# Patient Record
Sex: Male | Born: 1945 | Race: White | Hispanic: No | Marital: Married | Smoking: Former smoker
Health system: Southern US, Community
[De-identification: ages and names within clinical notes are randomized; demographics above are authoritative.]

## PROBLEM LIST (undated history)

## (undated) DIAGNOSIS — F419 Anxiety disorder, unspecified: Secondary | ICD-10-CM

## (undated) DIAGNOSIS — I493 Ventricular premature depolarization: Secondary | ICD-10-CM

## (undated) DIAGNOSIS — G453 Amaurosis fugax: Secondary | ICD-10-CM

## (undated) DIAGNOSIS — R059 Cough, unspecified: Secondary | ICD-10-CM

## (undated) DIAGNOSIS — I10 Essential (primary) hypertension: Secondary | ICD-10-CM

## (undated) DIAGNOSIS — R05 Cough: Secondary | ICD-10-CM

## (undated) HISTORY — DX: Ventricular premature depolarization: I49.3

## (undated) HISTORY — PX: HERNIA REPAIR: SHX51

## (undated) HISTORY — DX: Essential (primary) hypertension: I10

## (undated) HISTORY — DX: Cough: R05

## (undated) HISTORY — DX: Amaurosis fugax: G45.3

## (undated) HISTORY — DX: Anxiety disorder, unspecified: F41.9

## (undated) HISTORY — DX: Cough, unspecified: R05.9

## (undated) HISTORY — PX: ELBOW SURGERY: SHX618

## (undated) HISTORY — PX: CERVICAL DISCECTOMY: SHX98

---

## 2000-11-05 ENCOUNTER — Encounter: Payer: Self-pay | Admitting: Neurosurgery

## 2000-11-05 ENCOUNTER — Ambulatory Visit (HOSPITAL_COMMUNITY): Admission: RE | Admit: 2000-11-05 | Discharge: 2000-11-05 | Payer: Self-pay | Admitting: Neurosurgery

## 2001-04-19 ENCOUNTER — Encounter: Payer: Self-pay | Admitting: Neurosurgery

## 2001-04-19 ENCOUNTER — Ambulatory Visit (HOSPITAL_COMMUNITY): Admission: RE | Admit: 2001-04-19 | Discharge: 2001-04-19 | Payer: Self-pay | Admitting: Neurosurgery

## 2001-04-30 ENCOUNTER — Ambulatory Visit (HOSPITAL_COMMUNITY): Admission: RE | Admit: 2001-04-30 | Discharge: 2001-04-30 | Payer: Self-pay | Admitting: Neurosurgery

## 2001-04-30 ENCOUNTER — Encounter: Payer: Self-pay | Admitting: Neurosurgery

## 2004-04-17 ENCOUNTER — Encounter: Payer: Self-pay | Admitting: Internal Medicine

## 2006-07-09 ENCOUNTER — Encounter: Admission: RE | Admit: 2006-07-09 | Discharge: 2006-07-09 | Payer: Self-pay | Admitting: Sports Medicine

## 2007-02-17 ENCOUNTER — Encounter: Admission: RE | Admit: 2007-02-17 | Discharge: 2007-02-17 | Payer: Self-pay | Admitting: Surgery

## 2007-02-19 ENCOUNTER — Ambulatory Visit (HOSPITAL_BASED_OUTPATIENT_CLINIC_OR_DEPARTMENT_OTHER): Admission: RE | Admit: 2007-02-19 | Discharge: 2007-02-19 | Payer: Self-pay | Admitting: Surgery

## 2009-06-29 ENCOUNTER — Ambulatory Visit
Admission: RE | Admit: 2009-06-29 | Discharge: 2009-06-29 | Payer: Self-pay | Admitting: Physical Medicine and Rehabilitation

## 2009-06-29 ENCOUNTER — Encounter (INDEPENDENT_AMBULATORY_CARE_PROVIDER_SITE_OTHER): Payer: Self-pay | Admitting: Physical Medicine and Rehabilitation

## 2009-06-29 ENCOUNTER — Ambulatory Visit: Payer: Self-pay | Admitting: Vascular Surgery

## 2009-06-29 ENCOUNTER — Emergency Department (HOSPITAL_COMMUNITY): Admission: EM | Admit: 2009-06-29 | Discharge: 2009-06-29 | Payer: Self-pay | Admitting: Emergency Medicine

## 2009-06-29 ENCOUNTER — Ambulatory Visit: Payer: Self-pay | Admitting: Cardiovascular Disease

## 2010-03-21 ENCOUNTER — Encounter: Admission: RE | Admit: 2010-03-21 | Discharge: 2010-03-21 | Payer: Self-pay | Admitting: Neurosurgery

## 2010-03-22 ENCOUNTER — Encounter: Admission: RE | Admit: 2010-03-22 | Discharge: 2010-03-22 | Payer: Self-pay | Admitting: Neurosurgery

## 2010-06-14 ENCOUNTER — Telehealth (INDEPENDENT_AMBULATORY_CARE_PROVIDER_SITE_OTHER): Payer: Self-pay | Admitting: *Deleted

## 2010-06-15 ENCOUNTER — Ambulatory Visit: Payer: Self-pay | Admitting: Internal Medicine

## 2010-06-15 DIAGNOSIS — E785 Hyperlipidemia, unspecified: Secondary | ICD-10-CM | POA: Insufficient documentation

## 2010-06-15 DIAGNOSIS — I4949 Other premature depolarization: Secondary | ICD-10-CM

## 2010-06-15 DIAGNOSIS — R002 Palpitations: Secondary | ICD-10-CM | POA: Insufficient documentation

## 2010-08-11 ENCOUNTER — Encounter: Admission: RE | Admit: 2010-08-11 | Discharge: 2010-08-11 | Payer: Self-pay | Admitting: Surgery

## 2010-09-12 ENCOUNTER — Telehealth (INDEPENDENT_AMBULATORY_CARE_PROVIDER_SITE_OTHER): Payer: Self-pay | Admitting: *Deleted

## 2010-10-19 ENCOUNTER — Encounter: Payer: Self-pay | Admitting: Internal Medicine

## 2010-10-19 ENCOUNTER — Encounter: Admission: RE | Admit: 2010-10-19 | Discharge: 2010-10-19 | Payer: Self-pay | Admitting: Family Medicine

## 2010-11-02 ENCOUNTER — Ambulatory Visit: Payer: Self-pay | Admitting: Internal Medicine

## 2010-11-02 DIAGNOSIS — R059 Cough, unspecified: Secondary | ICD-10-CM | POA: Insufficient documentation

## 2010-11-02 DIAGNOSIS — R05 Cough: Secondary | ICD-10-CM

## 2010-11-13 ENCOUNTER — Telehealth: Payer: Self-pay | Admitting: Internal Medicine

## 2010-11-22 ENCOUNTER — Telehealth: Payer: Self-pay | Admitting: Internal Medicine

## 2010-12-24 ENCOUNTER — Encounter: Payer: Self-pay | Admitting: Sports Medicine

## 2011-01-04 NOTE — Progress Notes (Signed)
  Recieved Records today from [facility] asked for them to be sent here. gave to [place]. Sean Mendoza  June 14, 2010 8:21 AM

## 2011-01-04 NOTE — Progress Notes (Signed)
Summary: refill request  Phone Note Refill Request Message from:  Patient on November 13, 2010 9:39 AM  metoprolol  delayed from [facility]-can get 7 pills at [facility] [phone]   Method Requested: Telephone to Pharmacy Initial call taken by: Glynda Jaeger,  November 13, 2010 9:41 AM  Follow-up for Phone Call        RX sent in to pharmacy. LMOM for pt. Marrion Coy, CNA  November 13, 2010 4:12 PM  Follow-up by: Marrion Coy, CNA,  November 13, 2010 4:12 PM    Prescriptions: METOPROLOL SUCCINATE 25 MG XR24H-TAB (METOPROLOL SUCCINATE) 1 three times a day  #90 x 2   Entered by:   Marrion Coy, CNA   Authorized by:   Nathen May, MD, [facility]   Signed by:   Marrion Coy, CNA on 11/13/2010   Method used:   Electronically to        [facility]. [phone]* (retail)       [phone] [address]       [place], [place]  [address]       Ph: [phone]       Fax: [phone]   RxID:   [id number]

## 2011-01-04 NOTE — Assessment & Plan Note (Signed)
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 They variably symptomatic aggravated by stress caffeine and lack of exercise. Previous evaluation has demonstrated normal left ventricular function again confirmed by echo in 2010.  The last couple of months they have been more symptomatic. This follows discectomy surgery on his neck which left him unable to exercise.  He denies chest pain shortness of breath or peripheral edema. There has been no syncope.  He did have an episode of transient left hand weakness. He saw Dr. Pearlean Brownie who thought that maybe he had a stroke. He then saw Dr. love in [place] who thought that this may have been acephalic migraine and that the MRI abnormality was trivial  Current Medications (verified): 1)  Aspirin 81 Mg Tabs (Aspirin) .... Take One Tablet Once Daily 2)  Coenzyme Q10 10 Mg Caps (Coenzyme Q10) .... Take One Tablet Capsule Once Daily 3)  Magnesium Oxide 400 Mg Tabs (Magnesium Oxide) .... Take One Tablet Once Daily 4)  Vitamin B-2 100 Mg Tabs (Riboflavin) .... Take Four Tablets Once Daily 5)  Fish Oil 1200 Mg Caps (Omega-3 Fatty Acids) .... Take One Capsule Once Daily 6)  Metoprolol Succinate 50 Mg Xr24h-Tab (Metoprolol Succinate) .... Take One Tablet Three Times A Day 7)  Fluoxetine Hcl 20 Mg Caps (Fluoxetine Hcl) .... Take One Capsule Once Daily 8)  Simvastatin 20 Mg Tabs (Simvastatin) .... Take One Tablet Once Daily  Allergies (verified): 1)  ! Sulfa  Past History:  Family History: Last updated: 06/15/2010 Negative FH of Diabetes, Hypertension, or Coronary Artery Disease  Social History: Last updated: 06/15/2010 Tobacco Use - No.  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  Alcohol Use - no Drug Use - no married 4 children  Review of Systems       full review of systems was negative apart from a history of present illness and past medical history except allergies  Vital Signs:  Patient profile:   65 year old male Height:      72 inches Weight:      211 pounds BMI:     28.72 Pulse rate:   63 / minute Pulse rhythm:   regular BP sitting:   138 / 84  (left arm) Cuff size:   regular  Vitals Entered By: Sean Mendoza CMA (June 15, 2010 12:42 PM)  Physical Exam  General:  Alert and oriented in no acute distress. HEENT  normal . Neck veins were flat; carotids brisk and full without bruits. No lymphadenopathy. Back without kyphosis. Lungs clear. Heart sounds regular without murmurs or gallops. PMI nondisplaced. Abdomen soft with active bowel sounds without midline pulsation or hepatomegaly. Femoral pulses and distal pulses intact. Extremities were without clubbing cyanosis or edemaSkin warm and dry. Neurological exam grossly normal affecgt non engaging    EKG  Procedure date:  06/15/2010  Findings:      sinus rhythm at 63 Intervals 0.15/0.13/0.43 Axis is -18 Otherwise notmal   Impression & Recommendations:  Problem # 1:  PREMATURE VENTRICULAR CONTRACTIONS (ICD-427.69) The patient has PVCs that suppress with exercise and are adrenergically sensitive.  The origin of these is not evident in the multiple records that we reveiewed..  Still they are not a significant problem most of the time esp if he is exercising frequently .  Sean Mendoza t having been said, with echo showing normlal LV function, I would do nothing differently His updated medication list for this problem includes:    Aspirin 81 Mg Tabs (Aspirin) .Marland Kitchen... Take one tablet once daily    Metoprolol  Succinate 50 Mg Xr24h-tab (Metoprolol succinate) .Marland Kitchen... Take one tablet three times a day  Problem # 2:  DYSLIPIDEMIA (ICD-272.4) His lipids are  being managed by Dr Kevan Ny.  I have suggested though based on recent issues with simvastatin, that alternatvie therapy would be reasonable.  He will resume his red yeast rice for now His updated medication list for this problem includes:    Simvastatin 20 Mg Tabs (Simvastatin) .Marland Kitchen... Take one tablet once daily  Patient Instructions: 1)  Your physician wants you to follow-up in: 6 months with Dr Graciela Husbands.  You will receive a reminder letter in the mail two months in advance. If you don't receive a letter, please call our office to schedule the follow-up appointment.

## 2011-01-04 NOTE — Progress Notes (Signed)
  pt signed ROI, i faxed records over to [facility] [place] to Dr.Donna Elspeth Cho Mesiemore  September 12, 2010 11:37 AM

## 2011-01-04 NOTE — Letter (Signed)
Summary: Echocardiogram, Office Note, Labs, EKG 2002 - 2006  Echocardiogram, Office Note, Labs, EKG 2002 - 2006   Imported By: Roderic Ovens 06/23/2010 11:23:25  _____________________________________________________________________  External Attachment:    Type:   Image     Comment:   External Document

## 2011-01-04 NOTE — Progress Notes (Signed)
Summary: talk to nurse  Phone Note Call from Patient Call back at Home Phone [phone]   Caller: Patient Call For: Emmaleah Meroney Summary of Call: Pt states he was given a rx for nexium 40mg  but brought prisolec instead, and states it's not working pls advise.//[facility] Initial call taken by: Darletta Moll,  November 22, 2010 1:07 PM  Follow-up for Phone Call        spoke to the pt and he states he never tried nexium he got prilosec instead and at first it helped his cough some but it did not last. Pt wanted to [place] should he try the nexium. I advised the pt to do as MW said at last ov..do nexium 40 mg 30-60 min before first meal and then do the pepcid at night until symptoms are gone for 1 week then stop the pepcid..not the nexium. Pt states understanding and requests a 90 day supply of nexium be sent to [facility]. Rx sent. pt aware. Carron Curie CMA  November 22, 2010 3:40 PM     Prescriptions: NEXIUM 40 MG  CPDR (ESOMEPRAZOLE MAGNESIUM) Take  one 30-60 min before first meal of the day  #90 x 0   Entered by:   Carron Curie CMA   Authorized by:   Nyoka Cowden MD   Signed by:   Carron Curie CMA on 11/22/2010   Method used:   Telephoned to ...       [facility] (mail-order)             , [place]         Ph: [phone]       Fax: [phone]   RxID:   [id number]

## 2011-01-04 NOTE — Assessment & Plan Note (Signed)
Summary: Pulmonary consultation/ cough ? all uacs   Visit Type:  Initial Consult Copy to:  Dr. Shaune Pollack Primary Provider/Referring Provider:  Dr. Shaune Pollack  CC:  Chest congestion/Cough.  History of Present Illness: 29 yowm quit smoking in the 1970's with some wheezing used inhalers only transiently then  and since then sev times a year maybe related to flying recurrent  cough and wheeze with no need for inhalers or even otcs and good ex tolerance  with no  chronic cough.   November 02, 2010  1st pulmonary office eval cc soreness in chest that accompanied the usual cough  6 weeks prior to ov when the weather got cold this time worse than usual with minimal clear mucus. rx with with zpak then prednisone now complete but not back to baseline with sense of throat and chest congestion and nasal congestion.    Pt presently denies any significant sore throat, dysphagia, itching, sneezing,    excess or purulent nasal secretions,  fever, chills, sweats, unintended wt loss, pleuritic or exertional cp, hempoptysis, change in activity tolerance  orthopnea pnd or leg swelling Pt also denies any obvious fluctuation in symptoms with weather or environmental change or other alleviating or aggravating factors.       Current Medications (verified): 1)  Aspirin 81 Mg Tabs (Aspirin) .... Take One Tablet Once Daily 2)  Coenzyme Q10 10 Mg Caps (Coenzyme Q10) .... Take One Tablet Capsule Once Daily 3)  Magnesium Oxide 400 Mg Tabs (Magnesium Oxide) .... Take One Tablet Once Daily 4)  Vitamin B-2 100 Mg Tabs (Riboflavin) .... Take Four Tablets Once Daily 5)  Fish Oil 1200 Mg Caps (Omega-3 Fatty Acids) .... Take One Capsule Once Daily 6)  Metoprolol Succinate 25 Mg Xr24h-Tab (Metoprolol Succinate) .Marland Kitchen.. 1 Three Times A Day 7)  Fluoxetine Hcl 20 Mg Caps (Fluoxetine Hcl) .... Take One Capsule Once Daily 8)  Red Yeast Rice 600 Mg Caps (Red Yeast Rice Extract) .Marland Kitchen.. 1 Once Daily 9)  Mucinex Dm Maximum Strength  60-1200 Mg Xr12h-Tab (Dextromethorphan-Guaifenesin) .Marland Kitchen.. 1 To 2 Two Times A Day As Needed  Allergies (verified): 1)  ! 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  Quit in 1979.  Smoked for approx 5 yrs. Smoked up to 2 ppd ETOH- wine every other day Pleas Koch "constantly"  Review of Systems       The patient complains of productive cough, irregular heartbeats, sore throat, nasal congestion/difficulty breathing through nose, sneezing, and chest pain.  The patient denies shortness of breath with activity, shortness of breath at rest, non-productive cough, coughing up blood, acid heartburn, indigestion, loss of appetite, weight change, abdominal pain, difficulty swallowing, tooth/dental problems, headaches, itching, ear ache, anxiety, depression, hand/feet swelling, joint stiffness or pain, rash, change in color of mucus, and fever.    Vital Signs:  Patient profile:   65 year old male Weight:      213 pounds O2 Sat:      98 % on Room air Temp:     97.6 degrees F oral Pulse rate:   57 / minute BP sitting:   138 / 88  (left arm)  Vitals Entered By: Vernie Murders (November 02, 2010 10:08 AM)  O2 Flow:  Room air  Physical Exam  Additional Exam:  211 > 213 November 02, 2010  pleasant wm with classic voice fatigue and pseudowheeze resolves with purse lip maneuver  HEENT: nl dentition, turbinates, and orophanx. Nl external ear canals without cough reflex NECK :  without JVD/Nodes/TM/ nl carotid upstrokes bilaterally LUNGS: no acc muscle use, clear to A and P bilaterally without cough on insp or exp maneuvers CV:  RRR  no s3 or murmur or increase in P2, no edema    ABD:  soft and nontender with nl excursion in the supine position. No bruits or organomegaly, bowel sounds nl MS:  warm without deformities, calf tenderness, cyanosis or clubbing SKIN: warm and dry without lesions   NEURO:  alert, approp, no deficits     CXR  Procedure date:  10/19/2010  Findings:      No active lung dz  Impression & Recommendations:  Problem # 1:  COUGH (ICD-786.2)  The most common causes of chronic cough in immunocompetent adults include: upper airway cough syndrome (UACS), previously referred to as postnasal drip syndrome,  caused by variety of rhinosinus conditions; (2) asthma; (3) GERD; (4) chronic bronchitis from cigarette smoking or other inhaled environmental irritants; (5) nonasthmatic eosinophilic bronchitis; and (6) bronchiectasis. These conditions, singly or in combination, have accounted for up to 94% of the causes of chronic cough in prospective studies.  This is probably   Upper airway cough syndrome, so named because it's frequently impossible to sort out how much is  CR/sinusitis with freq throat clearing (which can be related to primary GERD)   vs  causing  secondary extra esophageal GERD from wide swings in gastric pressure that occur with throat clearing, promoting self use of mint and menthol lozenges that reduce the lower esophageal sphincter tone and exacerbate the problem further These are the same pts who not infrequently have failed to tolerate ace inhibitors,  dry powder inhalers or biphosphonates or report having reflux symptoms that don't respond to standard doses of PPI  Rec try max gerd rx first and if resolves then no pulmonary eval needed unless recurs or persists on gerd rx.  NB the  ramp to expected improvement (and for that matter, worsening, if a chronic effective medication is stopped)  can be measured in weeks, not days, a common misconception because this is not Heartburn with no immediate cause and effect relationship so that  response to therapy or lack thereof can be very difficult to assess.   Medications Added to Medication List This Visit: 1)  Fish Oil 1200 Mg Caps (Omega-3 fatty acids) .... Take one capsule once daily - hold whenever cough 2)  Metoprolol Succinate 25 Mg Xr24h-tab (Metoprolol succinate) .Marland Kitchen.. 1 three times a day 3)  Red Yeast Rice 600 Mg Caps (Red yeast rice extract) .Marland Kitchen.. 1 once daily 4)  Mucinex Dm Maximum Strength 60-1200 Mg Xr12h-tab (Dextromethorphan-guaifenesin) .Marland Kitchen.. 1 to 2 two times a day as needed 5)  Nexium 40 Mg Cpdr (Esomeprazole magnesium) .... Take  one 30-60 min before first meal of the day  Other Orders: Consultation Level V (28413) Consultation Level V (24401)  Patient Instructions: 1)  Nexium 40 mg Take  one 30-60 min before first meal of the day and pepcid 20mg  one at bedtime until cough congestion wheezing are gone for about a week then stop 2)  GERD (REFLUX)  is a common cause of respiratory symptoms. It commonly presents without heartburn and can be treated with medication,  but also with lifestyle changes including avoidance of late meals, excessive alcohol, smoking cessation, and avoid fatty foods, chocolate, peppermint, colas, red wine, and acidic juices such as orange juice. NO MINT OR MENTHOL PRODUCTS SO NO COUGH DROPS  3)  USE SUGARLESS CANDY INSTEAD (jolley ranchers)  4)  NO OIL BASED VITAMINS  5)  Please schedule a follow-up appointment as needed Prescriptions: NEXIUM 40 MG  CPDR (ESOMEPRAZOLE MAGNESIUM) Take  one 30-60 min before first meal of the day  #34 x 2   Entered and Authorized by:   Nyoka Cowden MD   Signed by:   Nyoka Cowden MD on 11/02/2010   Method used:   Electronically to        [facility]. [phone]* (retail)       [phone] [address]       [place], [place]  [address]       Ph: [phone]       Fax: [phone]   RxID:   [id number]

## 2011-01-31 ENCOUNTER — Telehealth (INDEPENDENT_AMBULATORY_CARE_PROVIDER_SITE_OTHER): Payer: Self-pay | Admitting: *Deleted

## 2011-02-08 NOTE — Progress Notes (Signed)
Summary: Records Request  Faxed OV & EKG to [facility] at [facility] ([phone]). Debby Freiberg  January 31, 2011 4:08 PM

## 2011-02-09 ENCOUNTER — Other Ambulatory Visit: Payer: Self-pay | Admitting: Surgery

## 2011-02-09 ENCOUNTER — Encounter (INDEPENDENT_AMBULATORY_CARE_PROVIDER_SITE_OTHER): Payer: Self-pay | Admitting: *Deleted

## 2011-02-09 ENCOUNTER — Encounter (HOSPITAL_COMMUNITY): Payer: BC Managed Care – PPO

## 2011-02-09 ENCOUNTER — Other Ambulatory Visit (HOSPITAL_COMMUNITY): Payer: BC Managed Care – PPO

## 2011-02-09 LAB — CBC
HCT: 47.3 % (ref 39.0–52.0)
Hemoglobin: 15.5 g/dL (ref 13.0–17.0)
MCH: 31.8 pg (ref 26.0–34.0)
MCHC: 32.8 g/dL (ref 30.0–36.0)
MCV: 97.1 fL (ref 78.0–100.0)
Platelets: 195 10*3/uL (ref 150–400)
RBC: 4.87 MIL/uL (ref 4.22–5.81)
RDW: 12.5 % (ref 11.5–15.5)
WBC: 7.4 10*3/uL (ref 4.0–10.5)

## 2011-02-09 LAB — COMPREHENSIVE METABOLIC PANEL
ALT: 31 U/L (ref 0–53)
AST: 23 U/L (ref 0–37)
Albumin: 3.9 g/dL (ref 3.5–5.2)
Alkaline Phosphatase: 58 U/L (ref 39–117)
BUN: 14 mg/dL (ref 6–23)
CO2: 30 mEq/L (ref 19–32)
Calcium: 9.5 mg/dL (ref 8.4–10.5)
Chloride: 104 mEq/L (ref 96–112)
Creatinine, Ser: 1.11 mg/dL (ref 0.4–1.5)
GFR calc Af Amer: >60 mL/min (ref >60)
GFR calc non Af Amer: >60 mL/min (ref >60)
Glucose, Bld: 83 mg/dL (ref 70–99)
Potassium: 4.4 mEq/L (ref 3.5–5.1)
Sodium: 142 mEq/L (ref 135–145)
Total Bilirubin: 1.7 mg/dL — ABNORMAL HIGH (ref 0.3–1.2)
Total Protein: 7.1 g/dL (ref 6.0–8.3)

## 2011-02-09 LAB — DIFFERENTIAL
Basophils Absolute: 0 10*3/uL (ref 0.0–0.1)
Basophils Relative: 0 % (ref 0–1)
Eosinophils Absolute: 0.2 10*3/uL (ref 0.0–0.7)
Eosinophils Relative: 3 % (ref 0–5)
Lymphocytes Relative: 25 % (ref 12–46)
Lymphs Abs: 1.9 10*3/uL (ref 0.7–4.0)
Monocytes Absolute: 1.2 10*3/uL — ABNORMAL HIGH (ref 0.1–1.0)
Monocytes Relative: 16 % — ABNORMAL HIGH (ref 3–12)
Neutro Abs: 4.2 10*3/uL (ref 1.7–7.7)
Neutrophils Relative %: 56 % (ref 43–77)

## 2011-02-09 LAB — SURGICAL PCR SCREEN
MRSA, PCR: NEGATIVE
Staphylococcus aureus: NEGATIVE

## 2011-02-13 ENCOUNTER — Ambulatory Visit (HOSPITAL_COMMUNITY)
Admission: RE | Admit: 2011-02-13 | Discharge: 2011-02-13 | Disposition: A | Discharge status: Home / Self Care | Payer: BC Managed Care – PPO | Source: Ambulatory Visit | Attending: Surgery | Admitting: Surgery

## 2011-02-13 DIAGNOSIS — Z01818 Encounter for other preprocedural examination: Secondary | ICD-10-CM | POA: Insufficient documentation

## 2011-02-13 DIAGNOSIS — I10 Essential (primary) hypertension: Secondary | ICD-10-CM | POA: Insufficient documentation

## 2011-02-13 DIAGNOSIS — K4090 Unilateral inguinal hernia, without obstruction or gangrene, not specified as recurrent: Secondary | ICD-10-CM | POA: Insufficient documentation

## 2011-02-13 DIAGNOSIS — R109 Unspecified abdominal pain: Secondary | ICD-10-CM | POA: Insufficient documentation

## 2011-02-13 DIAGNOSIS — Z01812 Encounter for preprocedural laboratory examination: Secondary | ICD-10-CM | POA: Insufficient documentation

## 2011-02-13 NOTE — Letter (Signed)
Summary: Appointment - Reschedule  [facility], Main Office  [address]   [place], [place] [address]   Phone: [phone]  Fax: [phone]     February 09, 2011 MRN: [id number]   [facility] [address] [place], [place]  [address]   Dear Sean Mendoza,   Due to a change in our office schedule, your appointment on  03-02-2011                     at  9:10 a.m. must be changed.  It is very important that we reach you to reschedule this appointment. We look forward to participating in your health care needs. Please contact us at the number listed above at your earliest convenience to reschedule this appointment.     Sincerely,     Lorne Skeens  [facility] Scheduling Team

## 2011-02-16 NOTE — Op Note (Signed)
NAMESEVERUS, BRODZINSKI              ACCOUNT NO.:  [id number]  MEDICAL RECORD NO.:  [id number]           PATIENT TYPE:  O  LOCATION:  DAYL                         FACILITY:  [facility]  PHYSICIAN:  Jazlene Bares A. Joss Mcdill, M.D.DATE OF BIRTH:  1946/06/20  DATE OF PROCEDURE: DATE OF DISCHARGE:                              OPERATIVE REPORT   PREOPERATIVE DIAGNOSIS:  History of left inguinal hernia repair mesh with intermittent left inguinal pain, questionable bilateral inguinal hernia by MRI evaluation.  POSTOPERATIVE DIAGNOSES: 1. Small right indirect inguinal hernia. 2. No evidence of left inguinal hernia with intact mesh with good     coverage of the internal ring and floor of the inguinal canal.  PROCEDURE:  Transabdominal repair of right inguinal hernia with mesh with examination of left inguinal canal.  ANESTHESIA:  General endotracheal anesthesia, 0.25% Sensorcaine local.  ESTIMATED BLOOD LOSS:  Under 50 mL.  SPECIMENS:  None. INDICATIONS FOR PROCEDURE:  The patient is a 65 year old male, back in 2008 he had a repair of his left inguinal hernia done.  He has had done fairly well but had some intermittent pain off and on over the last 6 months to 1 year.  The pain does slow his activity somewhat and he came for evaluation.  MRI showed bilateral indirect inguinal hernias which were quite small by MRI evaluation.  Examination did not reveal any recurrence of inguinal hernia.  Had a long discussion with he and wife about options of continued conservative care which would be medications and no surgery and just dealing with the pain as needed since it was a continuous versus laparoscopic evaluation and potential repair given the fact the MRI showed potentially bilateral inguinal hernia.  After lengthy discussion of the procedure, potential complications of bleeding, infection, injury to the bladder, injury to the colon, injury to the small bowel, small-bowel obstruction, injury to the  pelvic vessels, injury to the bladder, urethra or ureter, injury to the spermatic cord, testicular swelling, testicular pain, testicular loss and ischemia.  After discussion of all these potential issues with laparoscopic evaluation and potential repair, he wished to proceed.  DESCRIPTION OF PROCEDURE:  The patient was brought to the operating room.  After induction of general anesthesia, abdomen was prepped and draped in sterile fashion, and time-out was done.  Foley catheter was placed to decompress the bladder without difficulty.  1-cm supraumbilical incision was made.  Dissection was carried down to his fascia.  The fascia was opened midline.  The abdominal cavity was entered.  Pursestring suture of 0 Vicryl was placed and a 12-mm port was placed.  He was insufflated to 15 mmHg with CO2 and placed in Trendelenburg position.  Laparoscope was placed.  Upon examination, saw a very small right inguinal hernia which was indirect.  Upon examination of the left inguinal canal, I could see the inner leaflet of his Prolene Hernia System mesh which was in adequate position.  There was some mild contracture albeit but it actually, I thought, looked very good with no evidence of any recurrent indirect defect around it.  The direct region of the left looked good as well without any  obvious large inguinal hernia on the left side.  I did not see a direct inguinal nerve defect on the right.  I would like to go ahead and repair the right side since we discussed this preoperatively.  Also felt that needed to open the preperitoneal space here to examine the mesh carefully and to make sure there was a small indirect defect or small defect that occurred at a direct triangle.  Cautery was used to open the preperitoneal space just below the umbilicus.  The space was entered easily.  The bladder was well decompressed in the operative field.  We opened the peritoneal opening all way toward the right  anterior-superior iliac spine region. Once we opened this, I bluntly dissected down and dissected the small indirect hernia sac out from the inguinal canal.  There was a small tear though in the peritoneum there.  Once I dissected all way down below the iliopubic tract, I then worked medially and dissected out the Cooper's ligament with care taken not to injure the iliac vein.  No evidence of femoral hernia.  I then found the pubic tubercle of pubis.  The bladder was well decompressed and I created a small amount of space just behind the pubis carefully, bluntly very gently to place mesh back behind.  I did not see a direct defect on this side.  I then took the cautery and went ahead and opened the patient's left preperitoneal space.  There was a piece of mesh in it.  I was very careful to take the peritoneal down to the mesh.  The mesh was well incorporated in the preperitoneal space. I went ahead and dissected medially to get into the direct triangle region here and again I saw no evidence of hernia defect or failure of the previous repair.  There is a little bit of bulging here but the mesh was intact.  It was sutured down to the tubercle as well as to the iliopubic tract.  I did not see any defects or anything had been sliding around the mesh nor I see any indirect defect.  The repair to me looked adequate and looked well positioned.  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  Actually repair looked good.  I did not feel any intervention on this side would be prudent since I think more damage would be caused by trying to do any more exploring and/or explantation of this mesh since this pain was not that severe and I was afraid of compromising that repair by doing so.  At this point in time, I felt that the repair of the right-sided hernia was the only thing left to do.  I went ahead and got a 6 x 6 inch piece of  Ultrapro mesh.  Actually, let this overlap on to the patient's left side to give additional coverage of the direct triangle on the left side.  This lay quite nicely down below the pubis and I was able to tack it using a secure strap to Cooper's ligament medially with adequate effect.  I then put a few additional secure strap tacks on the mesh superiorly.  Care taken to avoid the injury at the epigastric vessels.  I did not put the tacks laterally for fear of injuring the nerves.  I was able to tuck the mesh down well below to cover the defect and laid flat.  I then went ahead  at this point in time and closed the peritoneum to secure the strap to where there were no gaps.  The small tear in the peritoneum and closing with 2-0 Ethibond suture using intracorporeal suturing techniques to secure and close the small peritoneal defect that overlay the mesh to prevent anything from slipping down in it.  This closed nicely and I did not see any defect of concern at this point in time.  Irrigation was suctioned out.  Urine was clear.  Re-examined the abdominal cavity and saw no evidence of injury to the small bowel or colon from this point of procedure and his bladder again looked without injury and I saw no extravasation of urine at this point in time.  At this point in time, we flattened and went out completely.  The omentum slid very nicely back behind the repair.  I slowly insufflated allowing the CO2 to escape.  There was some subcutaneous swelling noted from the air.  At this point in time, I removed all ports, allowing the CO2 to escape.  Total of 3 ports were used and all the 5-mm ports placed across lower midline.  At this point in time, we closed the fascial opening with a pursestring suture of 0 Vicryl, 4-0 Monocryl was used to close the skin.  Dermabond was applied. Foley catheter was removed.  There are no signs of any bleeding from the meatus or any blood in the urine.  All final  counts were found to be correct.  The patient was extubated and taken to recovery in satisfactory condition.     Katelinn Justice A. Gloriana Piltz, M.D.     TAC/MEDQ  D:  02/13/2011  T:  02/13/2011  Job:  [id number]  Electronically Signed by Harriette Bouillon M.D. on 02/16/2011 09:20:22 AM

## 2011-02-19 ENCOUNTER — Encounter: Payer: Self-pay | Admitting: Internal Medicine

## 2011-03-02 ENCOUNTER — Ambulatory Visit: Payer: Self-pay | Admitting: Internal Medicine

## 2011-03-19 ENCOUNTER — Ambulatory Visit (INDEPENDENT_AMBULATORY_CARE_PROVIDER_SITE_OTHER): Payer: BC Managed Care – PPO | Admitting: Internal Medicine

## 2011-03-19 ENCOUNTER — Encounter: Payer: Self-pay | Admitting: Internal Medicine

## 2011-03-19 VITALS — BP 154/60 | HR 58 | Ht 72.0 in | Wt 210.4 lb

## 2011-03-19 DIAGNOSIS — G4730 Sleep apnea, unspecified: Secondary | ICD-10-CM

## 2011-03-19 DIAGNOSIS — I10 Essential (primary) hypertension: Secondary | ICD-10-CM

## 2011-03-19 DIAGNOSIS — I4949 Other premature depolarization: Secondary | ICD-10-CM

## 2011-03-19 DIAGNOSIS — I4510 Unspecified right bundle-branch block: Secondary | ICD-10-CM

## 2011-03-19 DIAGNOSIS — I493 Ventricular premature depolarization: Secondary | ICD-10-CM

## 2011-03-19 MED ORDER — METOPROLOL TARTRATE 25 MG PO TABS: ORAL_TABLET | ORAL | Status: DC

## 2011-03-19 MED ORDER — VITAMIN B-2 100 MG PO TABS: ORAL_TABLET | ORAL | Status: DC

## 2011-03-19 NOTE — Assessment & Plan Note (Signed)
Relatively stable at this point he will down titrate his beta blocker

## 2011-03-19 NOTE — Patient Instructions (Signed)
Your physician recommends that you schedule a follow-up appointment in: 6 MONTHS WITH DR Graciela Husbands Your physician recommends that you continue on your current medications as directed. Please refer to the Current Medication list given to you today. Night watch

## 2011-03-19 NOTE — Assessment & Plan Note (Signed)
Stable And unchanged from 2001

## 2011-03-19 NOTE — Assessment & Plan Note (Signed)
He has sleep disordered breathing daytime some wounds and a.m. Headaches. His triads suggests sleep apnea. He also has long-standing hypertension. We'll obtain a night watch for study

## 2011-03-19 NOTE — Assessment & Plan Note (Signed)
We will check his night watch for sleep apnea. Otherwise he may need therapy for his hypertension. He says he has whitecoat hypertension. I am not convinced.

## 2011-03-19 NOTE — Progress Notes (Signed)
  HPI  Sean Mendoza is a 65 y.o. 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  The origin of these is not evident in the multiple records that we reveiewed..  Still they are not a significant problem most of the time esp if he is exercising frequently .  Hurley Cisco t having been said, with echo showing normlal LV function,   He has been treated with metoprolol. Dr. Shelva Majestic told them that throbbing in his knee might be related to excess and metoprolol dosing and he is down titrated back to 50 mg. He has noted no untoward.  He also reports that his blood pressure has been high along time. He does snore his wife says very loudly. He has daytime somnolence and a.m. Headaches.    Past Medical History  Diagnosis Date  . HTN (hypertension)   . Migraine   . PVC's (premature ventricular contractions)   . Anxiety   . Cough     wert on set 09/2010    Past Surgical History  Procedure Date  . Hernia repair   . Cervical discectomy   . Elbow surgery      Current Outpatient Prescriptions  Medication Sig Dispense Refill  . aspirin 81 MG tablet Take 81 mg by mouth daily.        Marland Kitchen co-enzyme Q-10 30 MG capsule Take 30 mg by mouth daily.        Marland Kitchen FLUoxetine (PROZAC) 20 MG capsule Take 20 mg by mouth daily.        . Magnesium Oxide 400 (241.3 MG) MG TABS 1 tab po qd       . oxyCODONE-acetaminophen (PERCOCET) 7.5-325 MG per tablet       . esomeprazole (NEXIUM) 40 MG capsule Take 40 mg by mouth daily before breakfast.        . guaiFENesin (MUCINEX) 600 MG 12 hr tablet Take 1,200 mg by mouth 2 (two) times daily.        . Omega-3 Fatty Acids (FISH OIL) 1200 MG CAPS 1 tab po qd       . Red Yeast Rice Extract 600 MG CAPS 1 tab po qd       . Tamsulosin HCl (FLOMAX) 0.4 MG CAPS       . traMADol (ULTRAM) 50 MG tablet       . vitamin B-12 (CYANOCOBALAMIN) 100 MCG tablet 4 tabs po qd         Allergies  Allergen Reactions  . Ampicillin     REACTION: itching  .  Sulfonamide Derivatives     Review of Systems negative except from HPI and PMH  Physical Exam Well developed and well nourished in no acute distress HENT normal E scleral and icterus clear Neck Supple JVP flat; carotids brisk and full Clear to ausculation Regular rate and rhythm, no murmurs gallops or rub Soft with active bowel sounds No clubbing cyanosis and edema Alert and oriented, grossly normal motor and sensory function Skin Warm and Dry  ECG Normal sinus rhythm at 58 Intervals 0.16/0.13/145 Axis is leftward -29  Assessment and  Plan

## 2011-03-21 ENCOUNTER — Other Ambulatory Visit: Payer: Self-pay | Admitting: *Deleted

## 2011-03-21 DIAGNOSIS — G4730 Sleep apnea, unspecified: Secondary | ICD-10-CM

## 2011-03-27 ENCOUNTER — Telehealth: Payer: Self-pay | Admitting: Internal Medicine

## 2011-03-30 NOTE — Telephone Encounter (Signed)
OKAY WILL LET DR Graciela Husbands KNOW./CY

## 2011-04-20 NOTE — Op Note (Signed)
[place]  Patient:    Sean Mendoza, Sean Mendoza                     MRN: [id number] Proc. Date: 11/05/00 Adm. Date:  [id number] Attending:  Donn Pierini                           Operative Report  SERVICE:  Neurosurgery.  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  PROCEDURE:  Left C6-7 laminotomy and foraminotomy with microdissection.  SURGEON:  Julio Sicks, M.D.  ASSISTANT:  Reinaldo Meeker, M.D.  ANESTHESIA:  General endotracheal anesthesia.  INDICATIONS:  Sean Mendoza is a 65 year old with a history of neck and left upper extremity pain and paresthesias consistent with a left-sided C7 radiculopathy.  He has some mild left-sided triceps weakness as well.  MRI scanning demonstrates a foraminal disk herniation off to the left side at C6-7 with compression of the left-sided C7 nerve root.  The patient has been counseled as to his options including both operative and nonoperative management.  He has decided to proceed with a left-sided C6-7 laminotomy foraminotomy for hopeful improvement of his symptoms.  OPERATIVE NOTE:  The patient was taken to the operating room and placed on the table in supine position.  After an adequate level of anesthesia was achieved, the patient was positioned prone onto bolsters with the head fixed in neutral high position using a [place] 10 headdress.  The patients posterior cervical region was shaved and prepped sterilely.  A 10 blade was used to make a linear skin incision overlying the C6-7 interspace.  This was carried down sharply in the midline.  A subperiosteal dissection was then performed on the left side exposing the lamina and facet joints of C6 and C7.  A deep self-retaining retractor was placed, and x-rays taken.  The level was confirmed.  The laminotomy was  then performed using Kerrison rongeurs to remove the superior 1/3rd of the lamina of C7, the medial aspect of the C6-7 facet joint, and the inferior rim of the lamina of C6.  The ligamentum of flavum was then elevated and resected in a piecemeal fashion using Kerrison rongeurs.  The underlying thecal sac and exiting C7 nerve root were identified.  The microscope was brought into the field for microdissection of the epidural space and underlying disk herniation.   The epidural venous plexus with coagulating cut.  The sheath overlying the left side of C7 nerve root was dissected free using microinstruments and incised.  Approach to the disk was made through the axillae of the C7 nerve root.  The nerve root itself was retracted gently superiorly.  A large, kind of hump of disk herniation was encountered.  This was incised with an 11 blade.  This was then removed using pituitary rongeurs.  This completely decompressed the nerve root.  At this point there is no evidence of any continued compression.  There is no evidence of any residual disk herniation.  The wound was then copiously irrigated with antibiotic solution.  Gelfoam was placed topically for hemostasis which was found to be good.  Retraction instrument was removed.  Hemostasis in the muscle was achieved with electrocautery and the wound was then closed in layers with Vicryl sutures. Staples were applied to the surface.  There were no apparent complications. The patient tolerated  the procedure well and he returns to the recovery room postoperatively. DD:  11/05/00 TD:  11/05/00 Job: [id number]

## 2011-04-20 NOTE — Op Note (Signed)
Sean Mendoza, Sean Mendoza              ACCOUNT NO.:  [id number]   MEDICAL RECORD NO.:  [id number]          PATIENT TYPE:  AMB   LOCATION:  NESC                         FACILITY:  [facility]   PHYSICIAN:  Thomas A. Cornett, M.D.DATE OF BIRTH:  Apr 24, 1946   DATE OF PROCEDURE:  02/19/2007  DATE OF DISCHARGE:                               OPERATIVE REPORT   PREOPERATIVE DIAGNOSIS:  Left inguinal hernia.   POSTOPERATIVE DIAGNOSIS:  Left inguinal hernia.   OPERATION/PROCEDURE:  Left inguinal hernia repair with mesh.   SURGEON:  Maisie Fus A. Cornett, M.D.   ANESTHESIA:  LMA with 20 mL of 0.25% Sensorcaine with epinephrine.   ESTIMATED BLOOD LOSS:  20 mL   SPECIMEN:  None.   INDICATIONS FOR PROCEDURE:  The patient is a 65 year old male with a  small left inguinal hernia who presents today for repair.  It is  symptomatic and interfering with his quality of life.  After informed  consent, the patient agreed to proceed with left inguinal hernia with  mesh.  He was marked preoperatively.   DESCRIPTION OF PROCEDURE:  The patient was brought to the operating  room, placed supine.  After induction of LMA anesthesia, left inguinal  region was prepped and draped in sterile fashion.  I infiltrated the  left inguinal crease with 0.25% Sensorcaine.  Incision was made.  Dissection was carried down through Scarpa's fascia.  The superficial  epigastric vessels were ligated.  Once I encountered the aponeurosis of  the external oblique, I infiltrated this with 0.25% Sensorcaine with  epinephrine.  The aponeurosis was opened in the direction of its fibers.  Cord structures were identified, encircled with a quarter-inch Penrose  drain.  Upon examination was very large lipoma which I teased away from  the cord and amputated at its base and passed it off the field.  This  was ligated with a 2-0 Vicryl.  The patient had a very large internal  ring opening and this ran right along the cord.  This was then probed  with my finger and I was able to create space within the preperitoneal  space by going down along cord and using my finger to bluntly dissect  underneath the muscles.  Once this was done a large Prolene hernia  system was placed with the large circular port in the preperitoneal  space which I used my finger to spread out to close this defect.  The  [place] piece was laid flat against the floor of the inguinal canal.  The  mesh was tacked to the pubic tubercle with an 0 Vicryl and the  connecting piece was tacked to the internal oblique medially with a 0  Vicryl.  The ilioinguinal nerve was swept away from this mesh since it  was not directly adjacent to it and pushed away and preserved.  I  secured the mesh to the shelving edge of the inguinal ligament with #2  Novofil.  Was also secured to the internal oblique and conjoined tendon  medially with a #2 Novofil.  A small slit was cut in the mesh and the  cord was allowed to  slide through this and then the mesh was closed  around the cord with #2 Novofil.  The mesh lay flat and hemostasis was  excellent.  I then closed the aponeurosis of the external oblique at  this point in time with a 2-0 Vicryl.  A 3-0 Vicryl was used to close  Scarpa's fascia and 4-0 Monocryl was used to close the skin.  Of note,  the cord structures exited  the mesh freely with the tip of my fifth digit and inserted quite easily  into this area without any tension.  Steri-Strips and dry dressings were  applied.  The patient tolerated the procedure well and was taken to  recovery in satisfactory condition.  All final counts were correct of  sponge, needle and instruments.      Thomas A. Cornett, M.D.  Electronically Signed     TAC/MEDQ  D:  02/19/2007  T:  02/19/2007  Job:  [id number]   cc:   Oley Balm. Georgina Pillion, M.D.  Fax: [phone]

## 2011-06-12 ENCOUNTER — Encounter (INDEPENDENT_AMBULATORY_CARE_PROVIDER_SITE_OTHER): Payer: BC Managed Care – PPO

## 2011-07-30 ENCOUNTER — Telehealth: Payer: Self-pay | Admitting: Internal Medicine

## 2011-07-30 NOTE — Telephone Encounter (Signed)
Pt having palpitations, getting worse, coughing

## 2011-07-30 NOTE — Telephone Encounter (Signed)
I talked with pt. Pt states for the last few weeks he has noticed an increase in palpitations. He denies lightheadedness or other symptoms but states the palpitations are annoying. Pt did see his PCP in the last week or so and was told to increase metoprolol tartrate to three 25mg  daily instead of two daily. Pt states he has not seen an improvement in his palpitations. He was going to check his metoprolol to be sure it was tartrate and not succinate. I will forward this to Dr Graciela Husbands for review and recommendations.

## 2011-08-08 NOTE — Telephone Encounter (Signed)
If these are the same, then they probably are the PVCs which have been thought to be adrenergically sensitive.  I would have him increase his betabolcer to 50 bid Metoprolol tartrate and get a TSH, CBC, and K/Mg to look for reasons that this may be getting worse thanks

## 2011-08-08 NOTE — Telephone Encounter (Signed)
Forwarding to Dr. Klein. 

## 2011-08-15 NOTE — Telephone Encounter (Signed)
I spoke with the patient. He states that he has been on metoprolol succ 25mg  TID. His PCP, Dr. Kevan Ny at [place] [facility] [place] has recently given him a prescription for metoprolol tartrate 25mg  to take prn. He has noticed no change in his palpitations. He states that he had labwork done about 2 months ago with Dr. Kevan Ny. I will call her office to see what he has had done. I have also advised that he stop metoprolol succ and start metoprolol tartrate 50mg  bid per Dr. Graciela Husbands to see if he notices a change in his symptoms. He will take metoprolol tart 25mg  two tablets bid for the next few days to see how he feels. He will call and let me know if this is working for him and we will update his RX at the pharmacy. He states he is on an OTC magnesium supplement of 200mg  daily. He wanted to know if he should be on a potassium supplement. I have advised that I will obtain his labs first, but that I would not advise anything more than potassium in his diet. He verbalizes understanding. Sherri Rad, RN, BSN  I have spoken with medical records at Dr. Kevan Ny office and they are going to send his most recent labs for me to review.

## 2011-08-15 NOTE — Telephone Encounter (Signed)
LMTC

## 2011-08-17 NOTE — Telephone Encounter (Signed)
I received a copy of the patient's labs from Dr. Kevan Ny office. Last labs were from 01/2011- lipid panel & PSA. 09/2010- BMP, 12/2009- CBC/TSH. The patient is out of town the rest of this week. I will call him back Monday to let him know when his labs were drawn.

## 2011-08-20 ENCOUNTER — Telehealth: Payer: Self-pay | Admitting: Cardiology

## 2011-08-20 MED ORDER — METOPROLOL TARTRATE 25 MG PO TABS: ORAL_TABLET | ORAL | Status: DC

## 2011-08-20 NOTE — Telephone Encounter (Signed)
Metoprolol 25 two twice a day,  Uses [facility] [address]

## 2011-08-21 ENCOUNTER — Telehealth: Payer: Self-pay | Admitting: Internal Medicine

## 2011-08-21 NOTE — Telephone Encounter (Signed)
Pt out of med, requested refill Monday and would like called in asap, and requesting call as soon as called in

## 2011-08-21 NOTE — Telephone Encounter (Signed)
PT CALLED AND STATED HE WENT TO PICK UP METOPROLOL AND IT WAS NOT THERE.  PLEASE CALL HIM WHEN THIS HAS BEEN DONE.  [phone]

## 2011-08-30 NOTE — Telephone Encounter (Signed)
LM for the patient to call

## 2011-09-03 NOTE — Telephone Encounter (Signed)
I spoke with the patient. He states he is feeling better on the metoprolol tartrate twice daily. I have explained to him that I did receive labs from Dr. Kevan Ny office, and for any reason he started to have any other problems, we would need to repeat labs because his last bmp was 09/2010 & his last CBC & TSH was 12/2009. He verbalizes understanding.

## 2011-09-03 NOTE — Telephone Encounter (Signed)
Pt returning call to Heather. Please call back.  

## 2011-09-03 NOTE — Telephone Encounter (Signed)
LMTC

## 2011-09-07 NOTE — Telephone Encounter (Signed)
The pt called the office with questions about developing palpitations over the past two days.  The pt said he had adjusted his metoprolol tartrate dosage times and wondered if this could cause symptoms.  I made the pt aware that this could and I recommended that he go back to taking medication twice a day. Pt agreed with plan.

## 2011-09-14 ENCOUNTER — Telehealth: Payer: Self-pay | Admitting: Internal Medicine

## 2011-09-14 NOTE — Telephone Encounter (Signed)
Pt calling wanting to ask nurse a question about medication pt is on. Please return call to discuss further.

## 2011-09-17 ENCOUNTER — Encounter: Payer: Self-pay | Admitting: Internal Medicine

## 2011-09-18 ENCOUNTER — Other Ambulatory Visit: Payer: Self-pay | Admitting: *Deleted

## 2011-09-18 DIAGNOSIS — I493 Ventricular premature depolarization: Secondary | ICD-10-CM

## 2011-09-18 MED ORDER — METOPROLOL TARTRATE 50 MG PO TABS: 50.0000 mg | ORAL_TABLET | Freq: Two times a day (BID) | ORAL | Status: DC

## 2011-09-18 NOTE — Telephone Encounter (Signed)
Pt has not had a call regarding his question about the pres.  Pt.is going out of town.  Please call today.

## 2011-09-19 NOTE — Telephone Encounter (Signed)
Pt is now out of town and has not heard anything.  He was told to advise if any issues and he is having issues but no call back.  [phone].

## 2011-09-26 ENCOUNTER — Ambulatory Visit (INDEPENDENT_AMBULATORY_CARE_PROVIDER_SITE_OTHER): Payer: BC Managed Care – PPO | Admitting: Internal Medicine

## 2011-09-26 ENCOUNTER — Encounter: Payer: Self-pay | Admitting: Internal Medicine

## 2011-09-26 DIAGNOSIS — R059 Cough, unspecified: Secondary | ICD-10-CM

## 2011-09-26 DIAGNOSIS — I4949 Other premature depolarization: Secondary | ICD-10-CM

## 2011-09-26 DIAGNOSIS — I10 Essential (primary) hypertension: Secondary | ICD-10-CM

## 2011-09-26 DIAGNOSIS — G4730 Sleep apnea, unspecified: Secondary | ICD-10-CM

## 2011-09-26 DIAGNOSIS — R05 Cough: Secondary | ICD-10-CM

## 2011-09-26 MED ORDER — ATENOLOL 50 MG PO TABS: 50.0000 mg | ORAL_TABLET | Freq: Every day | ORAL | Status: DC

## 2011-09-26 MED ORDER — NADOLOL 40 MG PO TABS: 40.0000 mg | ORAL_TABLET | Freq: Every day | ORAL | Status: DC

## 2011-09-26 MED ORDER — DILTIAZEM HCL ER COATED BEADS 120 MG PO CP24: 120.0000 mg | ORAL_CAPSULE | Freq: Every day | ORAL | Status: DC

## 2011-09-26 MED ORDER — VERAPAMIL HCL ER 120 MG PO CP24: 120.0000 mg | ORAL_CAPSULE | Freq: Every day | ORAL | Status: DC

## 2011-09-26 NOTE — Assessment & Plan Note (Signed)
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 We'll give him prescriptions today for now all, atenolol, retinal, and diltiazem. We'll see which one works best. Maryclare Labrador see him in 4 months.

## 2011-09-26 NOTE — Assessment & Plan Note (Signed)
We will continue him on his beta blockers. He is again encouraged to have a sleep study. Unfortunately [employer] refuse to pay for his home study. He will consider whether to have a sleep lab test

## 2011-09-26 NOTE — Assessment & Plan Note (Signed)
Encouraged to follow up with pulmonary

## 2011-09-26 NOTE — Assessment & Plan Note (Signed)
As above.

## 2011-09-26 NOTE — Patient Instructions (Signed)
Your physician has recommended you make the following change in your medication: You are being given prescriptions for 4 different medications to try in any order. Please allow at least 2 weeks on each medication before changing. Do not take more than one of these prescriptions at a time. 1) Nadolol 40mg  one tablet by mouth once daily. 2) Atenolol 50mg  one tablet by mouth once daily. 3) Diltiazem 120mg  one tablet by mouth once daily. 4) Verapamil 120mg  one tablet by mouth onced daily. - if you are taking nadolol/ atenolol and decide to switch to either diltiazem/ verapamil, then please take a 1/2 of a tablet on the nadolol/ atenolol for 5 days before stopping and changing to either diltiazem/ verapamil.  Your physician wants you to follow-up in: 4 months. You will receive a reminder letter in the mail two months in advance. If you don't receive a letter, please call our office to schedule the follow-up appointment.

## 2011-09-26 NOTE — Progress Notes (Signed)
  HPI  Sean Mendoza is a 65 y.o. 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  The origin of these is not evident in the multiple records that we reveiewed..  Still they are not a significant problem most of the time esp if he is exercising frequently .  Sean Mendoza t having been said, with echo showing normlal LV function,   He also reports that his blood pressure has been high along time. He does snore his wife says very loudly. He has daytime somnolence and a.m. Headaches.  Try taking metoprolol tartrate; this worked for a while and then he started having cough. Is concerned about allergies he went back to metoprolol succinate. His PVCs continue to be bothersome.    Past Medical History  Diagnosis Date  . HTN (hypertension)   . Migraine   . PVC's (premature ventricular contractions)   . Anxiety   . Cough     wert on set 09/2010    Past Surgical History  Procedure Date  . Hernia repair   . Cervical discectomy   . Elbow surgery      Current Outpatient Prescriptions  Medication Sig Dispense Refill  . aspirin 81 MG tablet Take 81 mg by mouth daily.        Marland Kitchen co-enzyme Q-10 30 MG capsule Take 30 mg by mouth daily.        Marland Kitchen esomeprazole (NEXIUM) 40 MG capsule Take 40 mg by mouth daily before breakfast.        . FLUoxetine (PROZAC) 20 MG capsule Take 20 mg by mouth daily.        Marland Kitchen guaiFENesin (MUCINEX) 600 MG 12 hr tablet Take 1,200 mg by mouth 2 (two) times daily.        . Magnesium Oxide 400 (241.3 MG) MG TABS 1 tab po qd       . metoprolol (LOPRESSOR) 50 MG tablet Take 1 tablet (50 mg total) by mouth 2 (two) times daily.  180 tablet  3  . oxyCODONE-acetaminophen (PERCOCET) 7.5-325 MG per tablet       . Red Yeast Rice Extract 600 MG CAPS 1 tab po qd       . riboflavin (VITAMIN B-2) 100 MG TABS 2 TABS EVERY DAY    0  . Tamsulosin HCl (FLOMAX) 0.4 MG CAPS       . traMADol (ULTRAM) 50 MG tablet       . vitamin B-12 (CYANOCOBALAMIN) 100 MCG tablet 4  tabs po qd       . Omega-3 Fatty Acids (FISH OIL) 1200 MG CAPS 1 tab po qd         Allergies  Allergen Reactions  . Ampicillin     REACTION: itching  . Sulfonamide Derivatives     Review of Systems negative except from HPI and PMH  Physical Exam Well developed and well nourished in no acute distress HENT normal E scleral and icterus clear Neck Supple JVP flat; carotids brisk and full Clear to ausculation Regular rate and rhythm, no murmurs gallops or rub Soft with active bowel sounds No clubbing cyanosis and edema Alert and oriented, grossly normal motor and sensory function Skin Warm and Dry  ECG Normal sinus rhythm at 58 Intervals 0.16/0.13/145 Axis is leftward -29  Assessment and  Plan

## 2011-10-16 ENCOUNTER — Other Ambulatory Visit: Payer: Self-pay | Admitting: Internal Medicine

## 2011-10-16 ENCOUNTER — Other Ambulatory Visit: Payer: Self-pay | Admitting: *Deleted

## 2011-10-16 DIAGNOSIS — I4949 Other premature depolarization: Secondary | ICD-10-CM

## 2011-10-16 MED ORDER — NADOLOL 40 MG PO TABS: 40.0000 mg | ORAL_TABLET | Freq: Every day | ORAL | Status: DC

## 2011-10-16 NOTE — Telephone Encounter (Signed)
Pt calling stating he needs a refill of nadolol 40 mg- 3 month supply called into [facility]. Pt would like metoprolol cancelled off his med list as the nadolol will be replacing the metoprolol.

## 2011-10-21 ENCOUNTER — Encounter (HOSPITAL_BASED_OUTPATIENT_CLINIC_OR_DEPARTMENT_OTHER): Payer: BC Managed Care – PPO

## 2011-10-23 ENCOUNTER — Encounter (HOSPITAL_BASED_OUTPATIENT_CLINIC_OR_DEPARTMENT_OTHER): Payer: BC Managed Care – PPO

## 2011-10-23 ENCOUNTER — Telehealth: Payer: Self-pay | Admitting: Internal Medicine

## 2011-10-23 DIAGNOSIS — I4949 Other premature depolarization: Secondary | ICD-10-CM

## 2011-10-23 MED ORDER — NADOLOL 40 MG PO TABS: 40.0000 mg | ORAL_TABLET | Freq: Every day | ORAL | Status: DC

## 2011-10-23 NOTE — Telephone Encounter (Signed)
New message:  Pt is going to be out of Nadolol 40 mg before they come in from [facility].  Please call in a 30 day supply to [facility] to hold him until they come in.

## 2011-10-24 ENCOUNTER — Telehealth: Payer: Self-pay | Admitting: Internal Medicine

## 2011-10-24 DIAGNOSIS — I4949 Other premature depolarization: Secondary | ICD-10-CM

## 2011-10-24 MED ORDER — NADOLOL 40 MG PO TABS: 40.0000 mg | ORAL_TABLET | Freq: Every day | ORAL | Status: DC

## 2011-10-24 NOTE — Telephone Encounter (Signed)
rx called into the wrong [address] said he gave [place] which address, but message only [address], needs Nadolol 40 mg called to [id number] [phone], pt going out of town and needs to pick up med this am

## 2011-10-24 NOTE — Telephone Encounter (Signed)
LMOM for pt that RX was sent into pharmacy.

## 2011-10-31 ENCOUNTER — Other Ambulatory Visit: Payer: Self-pay | Admitting: *Deleted

## 2011-10-31 ENCOUNTER — Other Ambulatory Visit: Payer: Self-pay | Admitting: Internal Medicine

## 2011-10-31 ENCOUNTER — Telehealth: Payer: Self-pay | Admitting: *Deleted

## 2011-10-31 DIAGNOSIS — I4949 Other premature depolarization: Secondary | ICD-10-CM

## 2011-10-31 MED ORDER — ATENOLOL 50 MG PO TABS: 50.0000 mg | ORAL_TABLET | Freq: Every day | ORAL | Status: DC

## 2011-10-31 NOTE — Telephone Encounter (Signed)
Patient called to have Atenolol 50 mg prescription call to [facility], a 30 day supply called to pharmacy as requested for pt.

## 2011-11-14 ENCOUNTER — Encounter (HOSPITAL_BASED_OUTPATIENT_CLINIC_OR_DEPARTMENT_OTHER): Payer: BC Managed Care – PPO

## 2011-11-20 ENCOUNTER — Encounter (HOSPITAL_BASED_OUTPATIENT_CLINIC_OR_DEPARTMENT_OTHER): Payer: BC Managed Care – PPO

## 2011-11-29 ENCOUNTER — Encounter (HOSPITAL_BASED_OUTPATIENT_CLINIC_OR_DEPARTMENT_OTHER): Payer: BC Managed Care – PPO

## 2011-12-03 ENCOUNTER — Telehealth: Payer: Self-pay | Admitting: Internal Medicine

## 2011-12-03 NOTE — Telephone Encounter (Signed)
I spoke with the pt and he has been trying Nadolol and Atenolol since his office visit.  The pt has been taking these medications for a couple of weeks and then the med no longer works so he switches back and forth.  At this time the pt would like to try Diltiazem 120mg  daily.  I reminded the pt that he needs to taper his beta blocker dose over the next five days. Rx for Diltiazem #30 refill 6 called into [facility].

## 2011-12-03 NOTE — Telephone Encounter (Signed)
Fu call  Pt is leaving town today and he hasnt heard anything about these. please call him when completed.

## 2011-12-03 NOTE — Telephone Encounter (Signed)
New msg Pt wants to know about rx for diltazem and verapamil. He wants them  called [facility] [phone]. These are the other two meds he was told to try please let him know when done. Today if possible

## 2011-12-03 NOTE — Telephone Encounter (Signed)
Left message for pt to call back.  Need to clarify what medication the pt is currently taking.

## 2011-12-10 ENCOUNTER — Telehealth: Payer: Self-pay | Admitting: Internal Medicine

## 2011-12-10 NOTE — Telephone Encounter (Signed)
New msg Pt was calling about new med he is taking- diltiazem. He wanted to talk to you about it. Please call

## 2011-12-10 NOTE — Telephone Encounter (Signed)
We had thought that the PVCs were sensitive to adrenaline   That seems to be confirmed  Which of the beta blockers as he tolerated the best

## 2011-12-10 NOTE — Telephone Encounter (Signed)
Patient calling stating he has had a lot more irregular heart beats since he weaned off nadolol 2 days ago.States is taking diltiazem er 120 mg daily for the past 2 days.Wants to know is this to be expected.Fowarded to Dr. Graciela Husbands for advice.

## 2011-12-11 NOTE — Telephone Encounter (Signed)
I spoke with Dr. Graciela Husbands about his response. He stated that since the patient was having more PVC's off of his beta blocker therapy, that they were more likely to be adrenaline driven or made worse by this. He recommends trying beta blocker therapy. He wanted to know if the patient favored one beta blocker over another from the trial he was given. He is failed all of them, then we can relook at what else is available. I have left a message for the patient to call back. I have advised I will be out of the office on 1/9, but he may speak with triage, or call me back on 1/10.

## 2011-12-12 ENCOUNTER — Telehealth: Payer: Self-pay | Admitting: *Deleted

## 2011-12-12 DIAGNOSIS — I493 Ventricular premature depolarization: Secondary | ICD-10-CM

## 2011-12-12 NOTE — Telephone Encounter (Signed)
12/12/11--heather and dr Nelson Chimes Kruckenberg calling stating he has been on the nadolol and atenolol for 28 days and altho they worked very well at first they didn't hold him,--he has been on diltiazem for 1 week and this does not seem to be working yet--he is also having acid reflux--he would like to know why, rather than having try the ca channel blockers, he couldn't just take a higher dose  of the beta blockers nadolol and atenolol--he seems very concerned about this and wondered if dr Graciela Husbands would call him tonoc and tell him exactly what to take next--he is having frequent PVC'S and rapid heart rate--advised i would pass his message along to dr Graciela Husbands and Herbert Seta, but could not guarantee a call back from dr Graciela Husbands tonight--i did advise him that heather and dr Graciela Husbands will be in office am of 12/13/11(mr spivack is in [place])--phone [phone]--nt

## 2011-12-13 ENCOUNTER — Ambulatory Visit (HOSPITAL_BASED_OUTPATIENT_CLINIC_OR_DEPARTMENT_OTHER): Payer: BC Managed Care – PPO | Attending: Internal Medicine | Admitting: Radiology

## 2011-12-13 VITALS — Ht 72.0 in | Wt 205.0 lb

## 2011-12-13 DIAGNOSIS — G4730 Sleep apnea, unspecified: Secondary | ICD-10-CM

## 2011-12-13 DIAGNOSIS — G4710 Hypersomnia, unspecified: Secondary | ICD-10-CM | POA: Insufficient documentation

## 2011-12-13 DIAGNOSIS — I498 Other specified cardiac arrhythmias: Secondary | ICD-10-CM | POA: Insufficient documentation

## 2011-12-13 DIAGNOSIS — R0683 Snoring: Secondary | ICD-10-CM

## 2011-12-13 NOTE — Telephone Encounter (Signed)
Per Dr. Graciela Husbands, we can d/c diltiazem and double up on the atenolol or nadolol, whichever worked the best. I spoke with the patient and made him aware of this. He states that the reflux is not bothering him so much, but his pvc's and palpitations were bothering him. He states that he spoke with Dr. Shelva Majestic last night for advice and he recommended to continue the diltiazem and take a half of the original dose of nadolol that he had been prescribed. Per the patient, he tried this and it worked well for him. He will continue this for now to see how he does, but if he feels his symptoms are not controlled, he will call back and we will double up on the atenolol or nadolol. He is agreeable with this. I will forward to Dr. Graciela Husbands as an Lorain Childes.

## 2011-12-13 NOTE — Telephone Encounter (Signed)
Addended by: Sherri Rad C on: 12/13/2011 06:01 PM   Modules accepted: Orders

## 2011-12-21 DIAGNOSIS — I498 Other specified cardiac arrhythmias: Secondary | ICD-10-CM

## 2011-12-21 DIAGNOSIS — G4710 Hypersomnia, unspecified: Secondary | ICD-10-CM

## 2011-12-21 DIAGNOSIS — G4730 Sleep apnea, unspecified: Secondary | ICD-10-CM

## 2011-12-22 NOTE — Procedures (Signed)
NAME:  Sean Mendoza, Sean Mendoza NO.:  [id number]  MEDICAL RECORD NO.:  [id number]          PATIENT TYPE:  OUT  LOCATION:  SLEEP CENTER                 FACILITY:  [facility]  PHYSICIAN:  Barbaraann Share, MD,FCCPDATE OF BIRTH:  06/21/1948  DATE OF STUDY:  12/13/2011                           NOCTURNAL POLYSOMNOGRAM  REFERRING PHYSICIAN:  Duke Salvia, MD, [facility]  LOCATION:  Sleep lab.  REFERRING PHYSICIAN:  Duke Salvia, MD, [facility]  INDICATION FOR STUDY:  Hypersomnia with sleep apnea.  EPWORTH SLEEPINESS SCORE:  4.  MEDICATIONS:  SLEEP ARCHITECTURE:  The patient had a total sleep time of 295 minutes with no slow-wave sleep and 60 minutes of REM.  Sleep onset latency was normal at 13 minutes, and REM onset was normal at 110 minutes.  Sleep efficiency was moderately reduced at 80%.  RESPIRATORY DATA:  The patient was found to have 9 apneas and 1 obstructive hypopnea, giving him an apnea-hypopnea index of 2 events per hour.  The events occurred more commonly in the supine position, and there was mild-to-moderate snoring noted throughout.  The patient did not meet split night criteria due to his small numbers of events.  OXYGEN DATA:  There was O2 desaturation transiently as low as 89%.  CARDIAC DATA:  Sinus bradycardia was noted fairly frequently during the night as well as rare PACs.  MOVEMENT-PARASOMNIA:  The patient had no significant leg jerks or other abnormal behavior seen.  IMPRESSIONS-RECOMMENDATIONS: 1. Small numbers of obstructive events, which do not meet the AHI     criteria for the obstructive sleep apnea     syndrome. 2. Sinus bradycardia noted as well as rare premature atrial     contractions.     Barbaraann Share, MD,FCCP Diplomate, American Board of Sleep Medicine Electronically Signed    [facility]MED[facility]  D:  12/21/2011 09:39:01  T:  12/22/2011 02:41:25  Job:  [id number]

## 2012-01-25 ENCOUNTER — Ambulatory Visit: Payer: BC Managed Care – PPO | Admitting: Internal Medicine

## 2012-02-15 ENCOUNTER — Telehealth: Payer: Self-pay | Admitting: Internal Medicine

## 2012-02-15 ENCOUNTER — Encounter: Payer: Self-pay | Admitting: Internal Medicine

## 2012-02-19 ENCOUNTER — Telehealth: Payer: Self-pay | Admitting: *Deleted

## 2012-02-19 NOTE — Telephone Encounter (Signed)
I left a message for the patient to call regarding his sleep study results per Dr. Graciela Husbands. Results negative for sleep apnea.

## 2012-02-19 NOTE — Telephone Encounter (Signed)
The patient is aware of his results. 

## 2012-03-03 ENCOUNTER — Other Ambulatory Visit: Payer: Self-pay | Admitting: *Deleted

## 2012-03-03 DIAGNOSIS — I4949 Other premature depolarization: Secondary | ICD-10-CM

## 2012-03-03 MED ORDER — DILTIAZEM HCL ER COATED BEADS 120 MG PO CP24: 120.0000 mg | ORAL_CAPSULE | Freq: Every day | ORAL | Status: DC

## 2012-03-06 ENCOUNTER — Telehealth: Payer: Self-pay | Admitting: Internal Medicine

## 2012-03-06 NOTE — Telephone Encounter (Signed)
JYN#[id number] refill needed diltiazem cd 120mg 

## 2012-03-27 ENCOUNTER — Other Ambulatory Visit: Payer: Self-pay | Admitting: Internal Medicine

## 2012-03-27 DIAGNOSIS — I4949 Other premature depolarization: Secondary | ICD-10-CM

## 2012-03-28 MED ORDER — DILTIAZEM HCL ER COATED BEADS 120 MG PO CP24: 120.0000 mg | ORAL_CAPSULE | Freq: Every day | ORAL | Status: DC

## 2012-11-10 ENCOUNTER — Other Ambulatory Visit: Payer: Self-pay | Admitting: Otolaryngology

## 2012-11-10 DIAGNOSIS — R439 Unspecified disturbances of smell and taste: Secondary | ICD-10-CM

## 2012-11-24 ENCOUNTER — Ambulatory Visit
Admission: RE | Admit: 2012-11-24 | Discharge: 2012-11-24 | Disposition: A | Discharge status: Home / Self Care | Payer: BC Managed Care – PPO | Source: Ambulatory Visit | Attending: Otolaryngology | Admitting: Otolaryngology

## 2012-11-24 DIAGNOSIS — R439 Unspecified disturbances of smell and taste: Secondary | ICD-10-CM

## 2012-11-24 MED ORDER — GADOBENATE DIMEGLUMINE 529 MG/ML IV SOLN
19.0000 mL | Freq: Once | INTRAVENOUS | Status: AC | PRN
  Administered 2012-11-24: 19 mL via INTRAVENOUS

## 2012-12-26 ENCOUNTER — Telehealth: Payer: Self-pay | Admitting: Internal Medicine

## 2012-12-26 DIAGNOSIS — I4949 Other premature depolarization: Secondary | ICD-10-CM

## 2012-12-26 MED ORDER — DILTIAZEM HCL ER COATED BEADS 120 MG PO CP24: 120.0000 mg | ORAL_CAPSULE | Freq: Every day | ORAL | Status: DC

## 2012-12-26 NOTE — Telephone Encounter (Signed)
New problem:   Diltiazem   120 mg   [facility] [address].

## 2012-12-29 ENCOUNTER — Telehealth: Payer: Self-pay | Admitting: Internal Medicine

## 2012-12-29 DIAGNOSIS — I4949 Other premature depolarization: Secondary | ICD-10-CM

## 2012-12-29 MED ORDER — DILTIAZEM HCL ER COATED BEADS 120 MG PO CP24: 120.0000 mg | ORAL_CAPSULE | Freq: Every day | ORAL | Status: DC

## 2012-12-29 NOTE — Telephone Encounter (Signed)
New Problem       Refill Request Diltiazem to [facility] on [address], [place]

## 2013-02-05 ENCOUNTER — Telehealth: Payer: Self-pay | Admitting: Internal Medicine

## 2013-02-05 NOTE — Telephone Encounter (Signed)
New Prob    Scheduled appt for follow up for 5/2 first available. Pt stated he knows Dr. Graciela Husbands personally and would like to see if he could possibly be squeezed in within the next 30 days after March 17. Would like to speak to nurse.

## 2013-02-05 NOTE — Telephone Encounter (Signed)
I spoke with the patient and offered him 02/25/13 at 9:00am. This works for him. I have sent a message to [facility] to schedule.

## 2013-02-18 ENCOUNTER — Telehealth: Payer: Self-pay | Admitting: Internal Medicine

## 2013-02-18 NOTE — Telephone Encounter (Signed)
Left message for pt to call, discussed with dr Graciela Husbands, all of the three meds can cause these symptoms. Dr Graciela Husbands recommends stopping each med individually for two weeks to see if his symptoms improve. So he can stop the nadolol for two weeks if no change in symptoms restart and stop the prozac for 2 weeks and repeat the same until he finds the one that causes the symptoms.

## 2013-02-18 NOTE — Telephone Encounter (Signed)
New Problem:   Patient called in because he believes he is having an adverse reaction to his nadolol (CORGARD) 40 MG tablet, FLUoxetine (PROZAC) 20 MG capsule or diltiazem (CARDIZEM CD) 120 MG 24 hr capsule.  Please call back.

## 2013-02-18 NOTE — Telephone Encounter (Signed)
Spoke with patient. Pt states about 5 months ago he had knee surgery. Following knee surgery he had bad taste in his mouth and a bad smell. He was treated for a sinus infection with several antibiotics. As a result he developed  stomach issues and gastritis. He is calling now because he would like to know if Dr Graciela Husbands thinks that any of his current medication, including nadolol or cardizem,  may be the cause of these symptoms. He has an appt with Dr Graciela Husbands 02/25/13 but would like this addressed before the appt with Dr Graciela Husbands. Pt is aware that I am forwarding this information to Dr Graciela Husbands for review

## 2013-02-19 NOTE — Telephone Encounter (Signed)
I spoke with the patient to make him aware of Dr. Odessa Fleming recommendations. He explained to me that he recently had an endoscopy. He then developed what was thought to be a sinus infection and gastritis. He was put on antibiotics and developed thrush in his mouth. He was recently started on dexilant 50 mg once daily, claritin, flonase, and sudafed. He wanted to make sure these meds were ok. I explained all but the sudafed would be ok due to the chance of palpitations/ tachycardia. He states his symptoms are getting a little better and he feels ok. He is due to see Dr. Graciela Husbands next week. He will continue his meds as they currently are with the option to try a trial of elimination of things do not improve.

## 2013-02-19 NOTE — Telephone Encounter (Signed)
F/u  Patient returning nurse call, he can be reached at hM#

## 2013-02-25 ENCOUNTER — Ambulatory Visit: Payer: BC Managed Care – PPO | Admitting: Internal Medicine

## 2013-02-27 ENCOUNTER — Encounter: Payer: Self-pay | Admitting: Internal Medicine

## 2013-02-27 ENCOUNTER — Ambulatory Visit (INDEPENDENT_AMBULATORY_CARE_PROVIDER_SITE_OTHER): Payer: BC Managed Care – PPO | Admitting: Internal Medicine

## 2013-02-27 VITALS — BP 126/70 | HR 59 | Ht 72.0 in | Wt 194.0 lb

## 2013-02-27 DIAGNOSIS — I4510 Unspecified right bundle-branch block: Secondary | ICD-10-CM

## 2013-02-27 DIAGNOSIS — E785 Hyperlipidemia, unspecified: Secondary | ICD-10-CM

## 2013-02-27 NOTE — Patient Instructions (Addendum)
Your physician recommends that you schedule a follow-up appointment in: 6 months  Your physician recommends that you return for lab work in: today - Lipid

## 2013-02-27 NOTE — Progress Notes (Signed)
Patient Care Team: Hollice Espy, MD as PCP - General (Family Medicine)   HPI  Sean Mendoza is a 67 y.o. 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 The origin of these is not evident in the multiple records that we reveiewed.. Still they are not a significant problem most of the time esp if he is exercising frequently . Hurley Cisco t having been said, with echo showing normlal LV function,     . His PVCs largely queiscent  Lots of neuro/ENT complaints   Past Medical History  Diagnosis Date  . HTN (hypertension)   . Migraine   . PVC's (premature ventricular contractions)   . Anxiety   . Cough     wert on set 09/2010    Past Surgical History  Procedure Laterality Date  . Hernia repair    . Cervical discectomy    . Elbow surgery       Current Outpatient Prescriptions  Medication Sig Dispense Refill  . aspirin 81 MG tablet Take 81 mg by mouth daily.        Marland Kitchen co-enzyme Q-10 30 MG capsule Take 30 mg by mouth daily.        Marland Kitchen diltiazem (CARDIZEM CD) 120 MG 24 hr capsule Take 1 capsule (120 mg total) by mouth daily.  90 capsule  0  . esomeprazole (NEXIUM) 40 MG capsule Take 40 mg by mouth daily before breakfast.        . FLUoxetine (PROZAC) 20 MG capsule Take 20 mg by mouth daily.        Marland Kitchen guaiFENesin (MUCINEX) 600 MG 12 hr tablet Take 1,200 mg by mouth 2 (two) times daily.        . Magnesium Oxide 400 (241.3 MG) MG TABS 1 tab po qd       . nadolol (CORGARD) 40 MG tablet Take 1/2 tablet by mouth once daily      . Omega-3 Fatty Acids (FISH OIL) 1200 MG CAPS 1 tab po qd       . oxyCODONE-acetaminophen (PERCOCET) 7.5-325 MG per tablet       . Red Yeast Rice Extract 600 MG CAPS 1 tab po qd       . riboflavin (VITAMIN B-2) 100 MG TABS 2 TABS EVERY DAY    0  . Tamsulosin HCl (FLOMAX) 0.4 MG CAPS       . traMADol (ULTRAM) 50 MG tablet       . vitamin B-12 (CYANOCOBALAMIN) 100 MCG tablet 4 tabs po qd        No current facility-administered  medications for this visit.    Allergies  Allergen Reactions  . Ampicillin     REACTION: itching  . Sulfonamide Derivatives     Review of Systems negative except from HPI and PMH  Physical Exam BP 126/70  Pulse 59  Ht 6' (1.829 m)  Wt 194 lb (87.998 kg)  BMI 26.31 kg/m2 Well developed and well nourished in no acute distress HENT normal E scleral and icterus clear Neck Supple JVP flat; carotids brisk and full Clear to ausculation  Regular rate and rhythm, no murmurs gallops or rub Soft with active bowel sounds No clubbing cyanosis none Edema Alert and oriented, grossly normal motor and sensory function Skin Warm and Dry  Electrocardiogram demonstrates sinus rhythm at 59 Intervals 16/12/44 Axis leftward -15 Right bundle branch block  Assessment and  Plan

## 2013-03-06 ENCOUNTER — Encounter: Payer: Self-pay | Admitting: Internal Medicine

## 2013-03-27 ENCOUNTER — Other Ambulatory Visit: Payer: Self-pay

## 2013-03-27 DIAGNOSIS — I4949 Other premature depolarization: Secondary | ICD-10-CM

## 2013-03-27 MED ORDER — DILTIAZEM HCL ER COATED BEADS 120 MG PO CP24: 120.0000 mg | ORAL_CAPSULE | Freq: Every day | ORAL | Status: DC

## 2013-03-27 NOTE — Telephone Encounter (Signed)
..   Requested Prescriptions   Signed Prescriptions Disp Refills  . diltiazem (CARDIZEM CD) 120 MG 24 hr capsule 90 capsule 0    Sig: Take 1 capsule (120 mg total) by mouth daily.    Authorizing Provider: Duke Salvia    Ordering User: Christella Hartigan, Gagandeep Pettet Judie Petit

## 2013-03-31 ENCOUNTER — Other Ambulatory Visit: Payer: Self-pay | Admitting: *Deleted

## 2013-04-03 ENCOUNTER — Ambulatory Visit: Payer: BC Managed Care – PPO | Admitting: Internal Medicine

## 2013-04-07 ENCOUNTER — Telehealth: Payer: Self-pay | Admitting: *Deleted

## 2013-04-07 NOTE — Telephone Encounter (Signed)
Message from the patient left on my voice mail. Patient calling regarding ongoing symptoms of nasal and stomach burning and a funny taste in his mouth. He states he reported this to Dr. Graciela Husbands at his last office visit and that he was to call back should his symptoms not resolve. The question being is this med related or possibly allergy related per Dr. Graciela Husbands. The patient also states that Dr. Graciela Husbands may possibly refer him to another MD to follow up on his symptoms. I will forward to Dr. Graciela Husbands to see is medications changes are needed or what the plan will be for the patient.

## 2013-07-01 ENCOUNTER — Telehealth: Payer: Self-pay | Admitting: *Deleted

## 2013-07-01 DIAGNOSIS — I4949 Other premature depolarization: Secondary | ICD-10-CM

## 2013-07-01 DIAGNOSIS — I493 Ventricular premature depolarization: Secondary | ICD-10-CM

## 2013-07-01 MED ORDER — NADOLOL 40 MG PO TABS: ORAL_TABLET | ORAL | Status: DC

## 2013-07-01 MED ORDER — DILTIAZEM HCL ER COATED BEADS 120 MG PO CP24: 120.0000 mg | ORAL_CAPSULE | Freq: Every day | ORAL | Status: DC

## 2013-07-01 NOTE — Telephone Encounter (Signed)
Pt returns call about reordering medications. He states he has called our office for the past 10 days & left messages he needs refill on diltiazem & nadolol I have sent refill to local pharmacy & mail order for pt. Pt is aware Mylo Red RN

## 2013-07-27 ENCOUNTER — Telehealth: Payer: Self-pay | Admitting: Internal Medicine

## 2013-07-27 NOTE — Telephone Encounter (Signed)
New problem   Pt left his Fluoxetine 20mg  in another stated and need a new prescription called into [facility] phone [phone]. Pt only need 30 tablets. Please call if any questions.

## 2013-07-27 NOTE — Telephone Encounter (Signed)
Left message for pt to call back about prescription

## 2013-10-20 ENCOUNTER — Telehealth: Payer: Self-pay | Admitting: Internal Medicine

## 2013-10-20 DIAGNOSIS — I4949 Other premature depolarization: Secondary | ICD-10-CM

## 2013-10-20 DIAGNOSIS — I493 Ventricular premature depolarization: Secondary | ICD-10-CM

## 2013-10-20 MED ORDER — NADOLOL 40 MG PO TABS: ORAL_TABLET | ORAL | Status: DC

## 2013-10-20 MED ORDER — DILTIAZEM HCL ER COATED BEADS 120 MG PO CP24: 120.0000 mg | ORAL_CAPSULE | Freq: Every day | ORAL | Status: DC

## 2013-10-20 NOTE — Telephone Encounter (Signed)
New message     Refill diltiazem and nadolol[facility]

## 2014-01-27 ENCOUNTER — Other Ambulatory Visit: Payer: Self-pay

## 2014-01-27 ENCOUNTER — Telehealth: Payer: Self-pay

## 2014-01-27 DIAGNOSIS — I4949 Other premature depolarization: Secondary | ICD-10-CM

## 2014-01-27 DIAGNOSIS — I493 Ventricular premature depolarization: Secondary | ICD-10-CM

## 2014-01-27 MED ORDER — DILTIAZEM HCL ER COATED BEADS 120 MG PO CP24: 120.0000 mg | ORAL_CAPSULE | Freq: Every day | ORAL | Status: DC

## 2014-01-27 MED ORDER — NADOLOL 40 MG PO TABS: ORAL_TABLET | ORAL | Status: DC

## 2014-01-27 NOTE — Telephone Encounter (Signed)
Patient called in requesting refills on meds and states that Dr Graciela HusbandsKlein said that he did not need to come in for a visit . Spoke with Sherri and  Dr Graciela HusbandsKlein said to refill med and he can come back in a year

## 2014-06-17 NOTE — Telephone Encounter (Signed)
Close encounter 

## 2014-07-29 ENCOUNTER — Other Ambulatory Visit: Payer: Self-pay

## 2014-07-29 DIAGNOSIS — I4949 Other premature depolarization: Secondary | ICD-10-CM

## 2014-07-29 MED ORDER — DILTIAZEM HCL ER COATED BEADS 120 MG PO CP24: 120.0000 mg | ORAL_CAPSULE | Freq: Every day | ORAL | Status: DC

## 2014-11-02 ENCOUNTER — Other Ambulatory Visit: Payer: Self-pay | Admitting: Cardiovascular Disease

## 2014-11-03 NOTE — Telephone Encounter (Signed)
Please advise on refill. Patient is way overdue for an appointment. Thanks, MI

## 2014-11-04 NOTE — Telephone Encounter (Signed)
OK to refill, per Dr. Graciela HusbandsKlein.   6 month supply

## 2014-12-23 ENCOUNTER — Other Ambulatory Visit: Payer: Self-pay | Admitting: Internal Medicine

## 2014-12-23 NOTE — Telephone Encounter (Signed)
Please advise on refill as patient is overdue for an appointment. Thanks, MI

## 2014-12-23 NOTE — Telephone Encounter (Signed)
Please inform patient that we will refill for only 2 more months. (no refills after that) He needs to see someone, whether it be us or PCP for further refills on this medication. This is per Dr. Graciela HusbandsKlein.

## 2015-01-11 ENCOUNTER — Telehealth: Payer: Self-pay | Admitting: Internal Medicine

## 2015-01-11 NOTE — Telephone Encounter (Signed)
New problem     Pt want an appt very soon for medication refill. I tried to give pt an appt for April but he declined and stated he is a friend of Dr Graciela HusbandsKlein and he said let him know if he need an appt. Please advise pt.

## 2015-01-11 NOTE — Telephone Encounter (Signed)
Patient tells me that he needs to come in and see Dr. Graciela HusbandsKlein. He is aware scheduler will call him to arrange this visit.  He also tells me that he has enough medication until he comes in March. He will call if this changes.

## 2015-01-18 ENCOUNTER — Encounter: Payer: Self-pay | Admitting: Internal Medicine

## 2015-01-18 ENCOUNTER — Ambulatory Visit (INDEPENDENT_AMBULATORY_CARE_PROVIDER_SITE_OTHER): Payer: Medicare Other | Admitting: Internal Medicine

## 2015-01-18 VITALS — BP 132/90 | HR 54 | Ht 72.0 in | Wt 208.0 lb

## 2015-01-18 DIAGNOSIS — I493 Ventricular premature depolarization: Secondary | ICD-10-CM

## 2015-01-18 NOTE — Patient Instructions (Signed)
Your physician recommends that you continue on your current medications as directed. Please refer to the Current Medication list given to you today.  Your physician wants you to follow-up in: 6 months with Dr. Klein. You will receive a reminder letter in the mail two months in advance. If you don't receive a letter, please call our office to schedule the follow-up appointment.  

## 2015-01-18 NOTE — Progress Notes (Signed)
Patient Care Team: Sean HasAaron Morrow, MD as PCP - General (Family Medicine)   HPI  Sean Mendoza is a 69 y.o. 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 The origin of these is not evident in the multiple records that we reveiewed.. Still they are not a significant problem most of the time esp if he is exercising frequently . Sean Mendoza t having been said, with echo showing normlal LV function,     . His PVCs largely queiscent  Lots of neuro/ENT complaints   Past Medical History  Diagnosis Date  . HTN (hypertension)   . Migraine   . PVC's (premature ventricular contractions)   . Anxiety   . Cough     wert on set 09/2010    Past Surgical History  Procedure Laterality Date  . Hernia repair    . Cervical discectomy    . Elbow surgery       Current Outpatient Prescriptions  Medication Sig Dispense Refill  . CARTIA XT 120 MG 24 hr capsule TAKE 1 BY MOUTH DAILY 90 capsule 0  . co-enzyme Q-10 30 MG capsule Take 30 mg by mouth daily.      Marland Kitchen. FLUoxetine (PROZAC) 20 MG capsule Take 20 mg by mouth daily.      . nadolol (CORGARD) 40 MG tablet TAKE 1/2 BY MOUTH DAILY 45 tablet 0  . Red Yeast Rice Extract 600 MG CAPS 1 tab po qd     . riboflavin (VITAMIN B-2) 100 MG TABS 2 TABS EVERY DAY  0   No current facility-administered medications for this visit.    Allergies  Allergen Reactions  . Ampicillin     REACTION: itching  . Sulfonamide Derivatives     Review of Systems negative except from HPI and PMH  Physical Exam BP 132/90 mmHg  Pulse 54  Ht 6' (1.829 m)  Wt 208 lb (94.348 kg)  BMI 28.20 kg/m2 Well developed and well nourished in no acute distress HENT normal E scleral and icterus clear Neck Supple JVP flat; carotids brisk and full Clear to ausculation  Regular rate and rhythm, no murmurs gallops or rub Soft with active bowel sounds No clubbing cyanosis none Edema Alert and oriented, grossly normal motor and sensory function Skin  Warm and Dry  Electrocardiogram demonstrates  NSR 54 16/13/45 RBBB unchanged 2012  Assessment and  Plan  PVCs  quiescent  HTN  Systolic  inadequately controlled  Hypercholesterolemia  10 Yr CVD risk per NHLBI calculator with LDL220 and HDL 54  His BP needs closer control based on recent data He is disinclined to begin statin with 15% risk, as he says numbers were better previosly  He will calculate at home and let us know  Re dyeast rice appears inadequate

## 2015-01-31 ENCOUNTER — Telehealth: Payer: Self-pay | Admitting: Internal Medicine

## 2015-01-31 ENCOUNTER — Encounter: Payer: Self-pay | Admitting: *Deleted

## 2015-01-31 NOTE — Telephone Encounter (Signed)
New message      Nadolol was denied -----want us to offer an appeal for this drug

## 2015-02-01 ENCOUNTER — Telehealth: Payer: Self-pay | Admitting: *Deleted

## 2015-02-01 NOTE — Telephone Encounter (Signed)
F/U       [employer] calling, states their is a 72 hr turnaround for the appeal.  Left fax #[phone].

## 2015-02-01 NOTE — Telephone Encounter (Signed)
A representative from [employer] called about an appeal for the nadolol. She stated that the recommended medication to try is metoprolol, but I advised her that the patient has already tried this medication in 2012. She stated that by me providing her with that information, she could start an expedited request. She requests you to fax any clinical supporting documents to [phone] or you can call [phone]. Thanks, MI

## 2015-02-01 NOTE — Telephone Encounter (Signed)
Spoke with Lelon MastSamantha at [facility] who tells me that I do not need to send any paperwork in at this time.  Request is being processed at this time.

## 2015-02-03 ENCOUNTER — Telehealth: Payer: Self-pay | Admitting: Internal Medicine

## 2015-02-03 NOTE — Telephone Encounter (Signed)
New message  This message is in regards to the medication of Nadolol. Auth dates 01/27/15- until 01/28/16, She will be sending a letter in the m,ail today and the office shall have it within a few days//sr

## 2015-04-14 ENCOUNTER — Telehealth: Payer: Self-pay | Admitting: Internal Medicine

## 2015-04-14 MED ORDER — NADOLOL 40 MG PO TABS: ORAL_TABLET | ORAL | Status: DC

## 2015-04-14 NOTE — Telephone Encounter (Signed)
New message      Talk to the nurse about a conversation he had with Dr Graciela HusbandsKlein.  He wold not tell me any more

## 2015-04-14 NOTE — Telephone Encounter (Signed)
[facility] -- [place] going [place] and will be gone for one month.  He does not have enough to get him through and [employer] mail will not send more until next month, and he will not be here to get rx. Rx sent.

## 2015-04-25 ENCOUNTER — Other Ambulatory Visit: Payer: Self-pay | Admitting: *Deleted

## 2015-04-25 MED ORDER — DILTIAZEM HCL ER COATED BEADS 120 MG PO CP24: ORAL_CAPSULE | ORAL | Status: DC

## 2015-10-17 ENCOUNTER — Other Ambulatory Visit: Payer: Self-pay | Admitting: *Deleted

## 2015-10-17 MED ORDER — DILTIAZEM HCL ER COATED BEADS 120 MG PO CP24: ORAL_CAPSULE | ORAL | Status: DC

## 2015-10-25 ENCOUNTER — Encounter: Payer: Self-pay | Admitting: Internal Medicine

## 2015-10-25 ENCOUNTER — Ambulatory Visit (INDEPENDENT_AMBULATORY_CARE_PROVIDER_SITE_OTHER): Payer: Medicare Other | Admitting: Internal Medicine

## 2015-10-25 VITALS — BP 143/96 | HR 59 | Ht 72.0 in | Wt 201.5 lb

## 2015-10-25 DIAGNOSIS — I10 Essential (primary) hypertension: Secondary | ICD-10-CM

## 2015-10-25 DIAGNOSIS — I493 Ventricular premature depolarization: Secondary | ICD-10-CM

## 2015-10-25 MED ORDER — ATORVASTATIN CALCIUM 20 MG PO TABS: 20.0000 mg | ORAL_TABLET | Freq: Every day | ORAL | Status: DC

## 2015-10-25 NOTE — Progress Notes (Signed)
Patient Care Team: [name], MD as PCP - General (Family Medicine)   HPI  Carlyle LipaStephen W Mendoza is a 69 y.o. 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 The origin of these is not evident in the multiple records that we reveiewed.. Still they are not a significant problem most of the time esp if he is exercising frequently . [name] t having been said, with echo showing normlal LV function,     . His PVCs largely queiscent  he did have an episode of transient diplopia. He is undergoing neurological evaluation.  Lots of neuro/ENT complaints   Past Medical History  Diagnosis Date  . HTN (hypertension)   . Migraine   . PVC's (premature ventricular contractions)   . Anxiety   . Cough     wert on set 09/2010    Past Surgical History  Procedure Laterality Date  . Hernia repair    . Cervical discectomy    . Elbow surgery       Current Outpatient Prescriptions  Medication Sig Dispense Refill  . aspirin EC 81 MG tablet Take 81 mg by mouth daily.    Marland Kitchen. co-enzyme Q-10 30 MG capsule Take 30 mg by mouth daily.      Marland Kitchen. diltiazem (CARTIA XT) 120 MG 24 hr capsule TAKE 1 BY MOUTH DAILY 90 capsule 0  . FLUoxetine (PROZAC) 20 MG capsule Take 20 mg by mouth daily.      . nadolol (CORGARD) 20 MG tablet Take one tablet (20 mg) by mouth as directed    . Red Yeast Rice Extract 600 MG CAPS Take 1 capsule by mouth daily. 1 tab po qd    . riboflavin (VITAMIN B-2) 100 MG TABS 2 TABS EVERY DAY (Patient taking differently: Take 100 mg by mouth daily. 2 TABS EVERY DAY)  0   No current facility-administered medications for this visit.    Allergies  Allergen Reactions  . Ampicillin     REACTION: itching  . Sulfonamide Derivatives     Review of Systems negative except from HPI and PMH  Physical Exam BP 143/96 mmHg  Pulse 59  Ht 6' (1.829 m)  Wt 201 lb 8 oz (91.4 kg)  BMI 27.32 kg/m2 Well developed and well nourished in no acute distress HENT normal E scleral  and icterus clear Neck Supple JVP flat; carotids brisk and full Clear to ausculation  Regular rate and rhythm, no murmurs gallops or rub Soft with active bowel sounds No clubbing cyanosis none Edema Alert and oriented, grossly normal motor and sensory function Skin Warm and Dry  Electrocardiogram demonstrates  NSR 59  16/13/43 RBBB unchanged 2012  Assessment and  Plan  PVCs  quiescent  HTN  Systolic  inadequately controlled  Hypercholesterolemia  10 Yr CVD risk per NHLBI calculator with LDL220 and HDL 54  Diplopia-transient the patient had diplopia. He is under going to evaluation [place]. MRI is anticipated as his echo and carotid Dopplers.  These notes were reviewed. The patient's cholesterol was also noted and a 10 year calculator estimated 20% risk. We have started him on atorvastatin 20 mg daily.

## 2015-10-25 NOTE — Patient Instructions (Signed)
Medication Instructions: 1) Start Lipitor (atorvastatin) 40 mg one tablet by mouth once daily  Labwork: - none  Procedures/Testing: - none  Follow-Up: - Your physician wants you to follow-up in: 1 year with Dr. Graciela HusbandsKlein. You will receive a reminder letter in the mail two months in advance. If you don't receive a letter, please call our office to schedule the follow-up appointment.  Any Additional Special Instructions Will Be Listed Below (If Applicable).

## 2015-11-09 ENCOUNTER — Telehealth: Payer: Self-pay

## 2015-11-09 NOTE — Telephone Encounter (Signed)
Prior auth for Nadolol 20mg  [facility]

## 2016-01-20 ENCOUNTER — Telehealth: Payer: Self-pay | Admitting: Internal Medicine

## 2016-01-20 ENCOUNTER — Telehealth: Payer: Self-pay

## 2016-01-20 NOTE — Telephone Encounter (Signed)
New message      Calling to let nurse know that the nadolol  tablets has been approved.

## 2016-01-20 NOTE — Telephone Encounter (Signed)
Prior auth for Nadolol 20mg sent to [employer]. 

## 2016-01-27 ENCOUNTER — Other Ambulatory Visit: Payer: Self-pay

## 2016-01-27 ENCOUNTER — Other Ambulatory Visit: Payer: Self-pay | Admitting: Internal Medicine

## 2016-01-27 MED ORDER — NADOLOL 20 MG PO TABS: 20.0000 mg | ORAL_TABLET | Freq: Every day | ORAL | Status: DC

## 2016-02-11 ENCOUNTER — Other Ambulatory Visit: Payer: Self-pay | Admitting: Internal Medicine

## 2016-02-13 ENCOUNTER — Telehealth: Payer: Self-pay

## 2016-02-13 NOTE — Telephone Encounter (Signed)
Pt needs to speak with Dr. Odessa FlemingKlein's nurse regarding his Nadolol.

## 2016-02-15 NOTE — Telephone Encounter (Addendum)
Forwarding to triage to address with patient. Heather (Klein's nurse) is not in the office this week.

## 2016-02-15 NOTE — Telephone Encounter (Signed)
Pt is aware that this medication Nadolol 40 mg dose was  Changed to 20 mg on 10/25/15 O/V. Pt states he would like to speak to Dr. Graciela HusbandsKlein or his nurse Sherri RadHeather Mcghee RN regarding this issue, because Dr. Graciela HusbandsKlein wants for him to take the 40 mg once daily not the 20 mg as it was sent to his pharmacy.

## 2016-02-15 NOTE — Telephone Encounter (Signed)
Pt called because he states Dr. Graciela HusbandsKlein wants for him to take Nadolol 40 mg once daily. A prescription was sent last month to the pharmacy as 20 mg dose. Dr. Graciela HusbandsKlein changed the dose from 40 mg to 20 mg on 10/25/15 O/V to Nadolol 20 mg once daily. Left pt a message to call back.

## 2016-02-17 NOTE — Telephone Encounter (Signed)
Reviewed with Dr. Graciela HusbandsKlein -- ok for patient to be taking Nadolol 20 mg daily.  lmtcb to discuss w/ patient

## 2016-03-05 ENCOUNTER — Telehealth: Payer: Self-pay

## 2016-03-05 ENCOUNTER — Telehealth: Payer: Self-pay | Admitting: *Deleted

## 2016-03-05 MED ORDER — NADOLOL 40 MG PO TABS: ORAL_TABLET | ORAL | Status: DC

## 2016-03-05 NOTE — Telephone Encounter (Signed)
I called and spoke with the patient. He needs nadolol 40 mg to take 1/2 tablet once daily due to insurance and trying to get multiple approvals on the drug. I advised I will send in his RX for nadolol 40 mg one tablet by mouth once daily as directed- he will take 1/2 tablet (20 mg) once daily.

## 2016-03-05 NOTE — Telephone Encounter (Signed)
Nadolol approved by [employer] through 01/19/2017.

## 2016-03-05 NOTE — Telephone Encounter (Signed)
Patient called and stated that [facility]. He stated that Sherri understood that the nadolol needs to be ordered for 40mg . Please advise. Thanks, MI

## 2016-04-18 ENCOUNTER — Telehealth: Payer: Self-pay | Admitting: Internal Medicine

## 2016-04-18 NOTE — Telephone Encounter (Signed)
SPOKE WITH  PT .PT  CALLED COMPLAINING  WITH   HAVING  HAD  2  EPISODES  OF  CHEST  PAIN  AND  WAS TOLD  TO  CALL IF  HAD  ANY PROBLEMS NO OTHER  SYMPTOMS.  IS  REQUESTING  A  APPT. DISCUSSED   WITH DR Graciela HusbandsKLEIN   MAY  NEED  GXT  PER  DR Graciela HusbandsKLEIN WILL SEE  PT  ON 05-01-16 AT  1:15  PM  TO EVAL .  INSTRUCTED  IF  HAS   ANY  CHANGE   TO CALL OFFICE   AND   MAY NEED TO  GET   EARLIER  APPT.PT  AWARE .CY

## 2016-04-18 NOTE — Telephone Encounter (Signed)
Pt c/o of Chest Pain: STAT if CP now or developed within 24 hours  1. Are you having CP right now? no  2. Are you experiencing any other symptoms (ex. SOB, nausea, vomiting, sweating)? dizziness  3. How long have you been experiencing CP? Today    4. Is your CP continuous or coming and going? Came/went  5. Have you taken Nitroglycerin? no ?

## 2016-05-01 ENCOUNTER — Telehealth (HOSPITAL_COMMUNITY): Payer: Self-pay | Admitting: *Deleted

## 2016-05-01 ENCOUNTER — Telehealth (HOSPITAL_COMMUNITY): Payer: Self-pay | Admitting: Radiology

## 2016-05-01 ENCOUNTER — Ambulatory Visit (INDEPENDENT_AMBULATORY_CARE_PROVIDER_SITE_OTHER): Payer: Medicare Other | Admitting: Internal Medicine

## 2016-05-01 ENCOUNTER — Encounter: Payer: Self-pay | Admitting: Internal Medicine

## 2016-05-01 VITALS — BP 142/88 | HR 60 | Ht 72.0 in | Wt 210.2 lb

## 2016-05-01 DIAGNOSIS — R079 Chest pain, unspecified: Secondary | ICD-10-CM

## 2016-05-01 DIAGNOSIS — I493 Ventricular premature depolarization: Secondary | ICD-10-CM

## 2016-05-01 DIAGNOSIS — I10 Essential (primary) hypertension: Secondary | ICD-10-CM

## 2016-05-01 NOTE — Patient Instructions (Addendum)
Medication Instructions: - Your physician recommends that you continue on your current medications as directed. Please refer to the Current Medication list given to you today.  Labwork: - none  Procedures/Testing: - Your physician has requested that you have an exercise stress myoview. For further information please visit [email or web address]. Please follow instruction sheet, as given.  Follow-Up: - Your physician wants you to follow-up in: 1 year with Dr. Klein. You will receive a reminder letter in the mail two months in advance. If you don't receive a letter, please call our office to schedule the follow-up appointment.  Any Additional Special Instructions Will Be Listed Below (If Applicable).     If you need a refill on your cardiac medications before your next appointment, please call your pharmacy.   

## 2016-05-01 NOTE — Telephone Encounter (Signed)
Duplicate

## 2016-05-01 NOTE — Telephone Encounter (Signed)
Instructions given to patient 05/01/16 will arrive @730  05-02-16- will hold beta blocker EJY .

## 2016-05-01 NOTE — Progress Notes (Signed)
Patient Care Team: [name], MD as PCP - General (Family Medicine)   HPI  Sean Mendoza is a 70 y.o. 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 The origin of these is not evident in the multiple records that we reviewed.. Still they are not a significant problem most of the time esp if he is exercising frequently . Sean Mendoza t having been said, with echo showing normlal LV function,     He called the office last week with complaints of chest pain.  This has been infrequent.  He describes it as a chest heaviness although not particularly profound. When he walks, he denies shortness of breath but is limited first and foremost by his knees; he anticipates knee replacement surgery.   His PVCs largely queiscent.  He had  an episode of transient diplopia. He underwent neurological evaluation with [facility]. These records were reviewed. MRI scanning was recommended. Carotid scanning demonstrated nonobstructive disease. An echocardiogram was repeated. We don't have these data. After the evaluation he was told that it was probably a migraine and/or fatigue but not likely  TIA   Aspirin and more aggressive therapy for lipids have been recommended. In this regard we had started him on atorvastatin prior to this evaluation because of high 10 year risk calculation score      Past Medical History  Diagnosis Date  . HTN (hypertension)   . Migraine   . PVC's (premature ventricular contractions)   . Anxiety   . Cough     wert on set 09/2010    Past Surgical History  Procedure Laterality Date  . Hernia repair    . Cervical discectomy    . Elbow surgery       Current Outpatient Prescriptions  Medication Sig Dispense Refill  . aspirin EC 81 MG tablet Take 81 mg by mouth daily.    Marland Kitchen. atorvastatin (LIPITOR) 20 MG tablet Take 1 tablet (20 mg total) by mouth daily. 90 tablet 3  . clonazePAM (KLONOPIN) 1 MG tablet Take 1/4 tablet by mouth once daily as needed as  directed    . diltiazem (CARDIZEM CD) 120 MG 24 hr capsule take 1 capsule by mouth once daily 90 capsule 0  . FLUoxetine (PROZAC) 20 MG capsule Take 20 mg by mouth daily.      . nadolol (CORGARD) 40 MG tablet Take one tablet (40 mg) by mouth once daily as directed 30 tablet 6  . PRESCRIPTION MEDICATION Take 1,000 mg by mouth daily. Antibiotic for sinus infection (pt unsure of name)     No current facility-administered medications for this visit.    Allergies  Allergen Reactions  . Ampicillin     REACTION: itching  . Sulfonamide Derivatives     unknown    Review of Systems negative except from HPI and PMH  Physical Exam BP 142/88 mmHg  Pulse 60  Ht 6' (1.829 m)  Wt 210 lb 3.2 oz (95.346 kg)  BMI 28.50 kg/m2 Well developed and well nourished in no acute distress HENT normal E scleral and icterus clear Neck Supple JVP flat; carotids brisk and full Clear to ausculation  Regular rate and rhythm, no murmurs gallops or rub Soft with active bowel sounds No clubbing cyanosis none Edema Alert and oriented, grossly normal motor and sensory function Skin Warm and Dry  Electrocardiogram demonstrates  NSR 59  16/13/43 RBBB unchanged 2012  Assessment and  Plan  PVCs  quiescent  HTN  Systolic  inadequately  controlled  Hypercholesterolemia     RBBB  Chest pain --typical and atypical features   Chest pain history is concerning with his pretest probability given age blood pressure hyperlipidemia and family history; he would like to try walk; I'm not sanguine that he will accomplish his target heart rate. If he is unable to we will switch to a Lexiscan.  For now, we will hold off on his aspirin; we will continue his statin therapy

## 2016-05-01 NOTE — Telephone Encounter (Signed)
Left message on voicemail in reference to upcoming appointment scheduled for 05/02/16. Phone number given for a call back so details instructions can be given. Daneil DolinSharon S Brooks

## 2016-05-02 ENCOUNTER — Ambulatory Visit (HOSPITAL_COMMUNITY): Payer: Medicare Other | Attending: Cardiovascular Disease

## 2016-05-02 DIAGNOSIS — I10 Essential (primary) hypertension: Secondary | ICD-10-CM | POA: Insufficient documentation

## 2016-05-02 DIAGNOSIS — R9439 Abnormal result of other cardiovascular function study: Secondary | ICD-10-CM | POA: Diagnosis not present

## 2016-05-02 DIAGNOSIS — R079 Chest pain, unspecified: Secondary | ICD-10-CM

## 2016-05-02 LAB — MYOCARDIAL PERFUSION IMAGING
Estimated workload: 10.1 METS
Exercise duration (min): 9 min
Exercise duration (sec): 0 s
LV dias vol: 83 mL (ref 62–150)
LV sys vol: 28 mL
MPHR: 153 {beats}/min
Peak HR: 134 {beats}/min
Percent HR: 87 %
RATE: 0.31
Rest HR: 65 {beats}/min
SDS: 1
SRS: 8
SSS: 9
TID: 0.76

## 2016-05-02 MED ORDER — TECHNETIUM TC 99M TETROFOSMIN IV KIT
32.6000 | PACK | Freq: Once | INTRAVENOUS | Status: AC | PRN
  Administered 2016-05-02: 33 via INTRAVENOUS
  Filled 2016-05-02: qty 33

## 2016-05-02 MED ORDER — TECHNETIUM TC 99M TETROFOSMIN IV KIT
10.6000 | PACK | Freq: Once | INTRAVENOUS | Status: AC | PRN
  Administered 2016-05-02: 11 via INTRAVENOUS
  Filled 2016-05-02: qty 11

## 2016-05-08 ENCOUNTER — Other Ambulatory Visit: Payer: Self-pay | Admitting: Internal Medicine

## 2016-05-08 ENCOUNTER — Telehealth: Payer: Self-pay | Admitting: Internal Medicine

## 2016-05-08 NOTE — Telephone Encounter (Signed)
I called and spoke with the patient regarding his test results.

## 2016-05-08 NOTE — Telephone Encounter (Signed)
Follow-up      The pt is calling back to go over results from his test he had done. The pt is wanting the nurse to call in a couple of hours cause the pt is leaving for another appt.

## 2016-05-22 DIAGNOSIS — M1712 Unilateral primary osteoarthritis, left knee: Secondary | ICD-10-CM | POA: Diagnosis not present

## 2016-05-28 ENCOUNTER — Telehealth: Payer: Self-pay | Admitting: Internal Medicine

## 2016-05-28 NOTE — Telephone Encounter (Signed)
[facility]-placed in Chart Prep Bin.

## 2016-05-30 DIAGNOSIS — M1712 Unilateral primary osteoarthritis, left knee: Secondary | ICD-10-CM | POA: Diagnosis not present

## 2016-06-06 DIAGNOSIS — F411 Generalized anxiety disorder: Secondary | ICD-10-CM | POA: Diagnosis not present

## 2016-06-06 DIAGNOSIS — E7800 Pure hypercholesterolemia, unspecified: Secondary | ICD-10-CM | POA: Diagnosis not present

## 2016-06-06 DIAGNOSIS — I10 Essential (primary) hypertension: Secondary | ICD-10-CM | POA: Diagnosis not present

## 2016-06-07 DIAGNOSIS — M1712 Unilateral primary osteoarthritis, left knee: Secondary | ICD-10-CM | POA: Diagnosis not present

## 2016-10-17 ENCOUNTER — Other Ambulatory Visit: Payer: Self-pay | Admitting: Internal Medicine

## 2016-12-11 DIAGNOSIS — E785 Hyperlipidemia, unspecified: Secondary | ICD-10-CM | POA: Diagnosis not present

## 2016-12-11 DIAGNOSIS — Z125 Encounter for screening for malignant neoplasm of prostate: Secondary | ICD-10-CM | POA: Diagnosis not present

## 2016-12-11 DIAGNOSIS — Z136 Encounter for screening for cardiovascular disorders: Secondary | ICD-10-CM | POA: Diagnosis not present

## 2016-12-11 DIAGNOSIS — Z0000 Encounter for general adult medical examination without abnormal findings: Secondary | ICD-10-CM | POA: Diagnosis not present

## 2016-12-12 ENCOUNTER — Other Ambulatory Visit: Payer: Self-pay | Admitting: Family Medicine

## 2016-12-12 DIAGNOSIS — M1712 Unilateral primary osteoarthritis, left knee: Secondary | ICD-10-CM | POA: Diagnosis not present

## 2016-12-12 DIAGNOSIS — Z136 Encounter for screening for cardiovascular disorders: Secondary | ICD-10-CM

## 2016-12-20 ENCOUNTER — Ambulatory Visit: Payer: Medicare Other

## 2017-01-08 DIAGNOSIS — H2513 Age-related nuclear cataract, bilateral: Secondary | ICD-10-CM | POA: Diagnosis not present

## 2017-01-08 DIAGNOSIS — H02834 Dermatochalasis of left upper eyelid: Secondary | ICD-10-CM | POA: Diagnosis not present

## 2017-01-08 DIAGNOSIS — H524 Presbyopia: Secondary | ICD-10-CM | POA: Diagnosis not present

## 2017-01-08 DIAGNOSIS — H02831 Dermatochalasis of right upper eyelid: Secondary | ICD-10-CM | POA: Diagnosis not present

## 2017-01-10 DIAGNOSIS — M1712 Unilateral primary osteoarthritis, left knee: Secondary | ICD-10-CM | POA: Diagnosis not present

## 2017-01-16 DIAGNOSIS — J329 Chronic sinusitis, unspecified: Secondary | ICD-10-CM | POA: Diagnosis not present

## 2017-01-28 DIAGNOSIS — F411 Generalized anxiety disorder: Secondary | ICD-10-CM | POA: Diagnosis not present

## 2017-01-28 DIAGNOSIS — R309 Painful micturition, unspecified: Secondary | ICD-10-CM | POA: Diagnosis not present

## 2017-01-28 DIAGNOSIS — G4700 Insomnia, unspecified: Secondary | ICD-10-CM | POA: Diagnosis not present

## 2017-02-26 ENCOUNTER — Telehealth: Payer: Self-pay | Admitting: Internal Medicine

## 2017-02-26 DIAGNOSIS — R0781 Pleurodynia: Secondary | ICD-10-CM

## 2017-02-26 NOTE — Telephone Encounter (Signed)
Pt would like an appt please give him a call back.  There were no opening till July.

## 2017-02-26 NOTE — Telephone Encounter (Signed)
Melissa,  Can you please call the patient to schedule. If he's not having an issue- 1 st available should be fine.

## 2017-02-27 DIAGNOSIS — Z85828 Personal history of other malignant neoplasm of skin: Secondary | ICD-10-CM | POA: Diagnosis not present

## 2017-02-27 DIAGNOSIS — L570 Actinic keratosis: Secondary | ICD-10-CM | POA: Diagnosis not present

## 2017-02-27 DIAGNOSIS — L738 Other specified follicular disorders: Secondary | ICD-10-CM | POA: Diagnosis not present

## 2017-03-13 NOTE — Telephone Encounter (Signed)
Spoke with patient who called to report some chest tenderness and to make certain that sooner appointment is not needed. He is scheduled to see Dr. Graciela Husbands in May. He states he has hx of PVCs and he has noticed more of those recently. I asked him to describe pain and he states he feels it behind sternum with inspiration and with movement. He complains of tenderness to the area when he presses on it.  He states the pain subsides when he exhales. He states he attributes it to allergy season and recent weight lifting but wanted to be on the safe side and report it to [employer]. He denies exertional chest pain or SOB with activity. I advised that symptoms do not sound cardiac in nature and advised him to continue to monitor for new or worsening symptoms. I advised I will forward message to [facility] and Dr. Graciela Husbands for their awareness and that Herbert Seta will call him with any additional advice from Dr. Graciela Husbands. He verbalized understanding and agreement and thanked me for the call.

## 2017-03-13 NOTE — Telephone Encounter (Signed)
Follow Up:   Say he would like Heather to call him about moving up his appointment.

## 2017-03-20 ENCOUNTER — Telehealth: Payer: Self-pay | Admitting: *Deleted

## 2017-03-20 NOTE — Telephone Encounter (Signed)
Received Non-Formulary Exception form from [facility] for NADOLOL, completed form and will place in Dr Koren Bound bin for signature.

## 2017-03-21 ENCOUNTER — Telehealth: Payer: Self-pay

## 2017-03-21 ENCOUNTER — Telehealth: Payer: Self-pay | Admitting: Internal Medicine

## 2017-03-21 ENCOUNTER — Other Ambulatory Visit: Payer: Medicare Other

## 2017-03-21 DIAGNOSIS — R0781 Pleurodynia: Secondary | ICD-10-CM | POA: Diagnosis not present

## 2017-03-21 NOTE — Telephone Encounter (Signed)
Per Dr. Graciela Husbands, he spoke with the patient. He traveled to [place] recently and then developed pleuritic chest pain about a week later.  Per Dr. Graciela Husbands, the patient needs to come in for a CT of the chest with contrast (PE protocol) either today or tomorrow. I have spoken with the CT dept and the patient will need an updated BUN/ Creatinine on file.  They will call me back with the time for his CTA tomorrow.  I have notified the patient that he will need to come for labs today. He is agreeable. I will call him back with the time of his CT scan.  CTA scheduled for 1:30 pm tomorrow- the patient is aware.

## 2017-03-21 NOTE — Telephone Encounter (Signed)
New Message     Prior Authorization for Nadolol , they need clinic information to make determination, this has to be turned in today or it will be denied this is their 2nd attempt

## 2017-03-21 NOTE — Telephone Encounter (Signed)
Joline Maxcy at 03/21/2017 1:18 PM   Status: Signed    Patient returning call from Dr. Graciela Husbands .  Please call again .

## 2017-03-21 NOTE — Telephone Encounter (Signed)
Tier exception form was in Dr. Odessa Fleming box to sign. This has been signed and returned to the PA dept.

## 2017-03-21 NOTE — Telephone Encounter (Signed)
Patient returning call from Dr. Graciela Husbands .  Please call again .

## 2017-03-21 NOTE — Telephone Encounter (Signed)
Non-Formulary Exception request for Nadolol  faxed to [facility].

## 2017-03-22 ENCOUNTER — Ambulatory Visit (INDEPENDENT_AMBULATORY_CARE_PROVIDER_SITE_OTHER)
Admission: RE | Admit: 2017-03-22 | Discharge: 2017-03-22 | Disposition: A | Discharge status: Home / Self Care | Payer: Medicare Other | Source: Ambulatory Visit | Attending: Internal Medicine | Admitting: Internal Medicine

## 2017-03-22 ENCOUNTER — Telehealth: Payer: Self-pay | Admitting: Internal Medicine

## 2017-03-22 DIAGNOSIS — R0781 Pleurodynia: Secondary | ICD-10-CM | POA: Diagnosis not present

## 2017-03-22 DIAGNOSIS — R079 Chest pain, unspecified: Secondary | ICD-10-CM | POA: Diagnosis not present

## 2017-03-22 LAB — BASIC METABOLIC PANEL
BUN/Creatinine Ratio: 16 (ref 10–24)
BUN: 19 mg/dL (ref 8–27)
CO2: 28 mmol/L (ref 18–29)
Calcium: 9.5 mg/dL (ref 8.6–10.2)
Chloride: 99 mmol/L (ref 96–106)
Creatinine, Ser: 1.17 mg/dL (ref 0.76–1.27)
GFR calc Af Amer: 74 mL/min/{1.73_m2} (ref >59)
GFR calc non Af Amer: 64 mL/min/{1.73_m2} (ref >59)
Glucose: 112 mg/dL — ABNORMAL HIGH (ref 65–99)
Potassium: 4.1 mmol/L (ref 3.5–5.2)
Sodium: 141 mmol/L (ref 134–144)

## 2017-03-22 MED ORDER — IOPAMIDOL (ISOVUE-300) INJECTION 61%
80.0000 mL | Freq: Once | INTRAVENOUS | Status: AC | PRN
  Administered 2017-03-22: 80 mL via INTRAVENOUS

## 2017-03-22 NOTE — Telephone Encounter (Signed)
°  FYI   Request for Nadolol 40 mg has been approved effective yesterday for 1 year. Letters will be sent out to the patient.

## 2017-03-22 NOTE — Telephone Encounter (Signed)
**Note De-identified Shericka Johnstone Obfuscation** Noted  

## 2017-03-22 NOTE — Telephone Encounter (Signed)
**Note De-Identified Aixa Corsello Obfuscation** Fax received from [facility] stating that the pts tier exception on Nadolol has been approved. Approval good until 03/21/2018.

## 2017-03-27 ENCOUNTER — Telehealth: Payer: Self-pay | Admitting: Internal Medicine

## 2017-03-27 NOTE — Telephone Encounter (Signed)
Recent lab work & CT ordered by MD. Awaiting provider result note.

## 2017-03-27 NOTE — Telephone Encounter (Signed)
Mr.Mette is calling to get the test results . Please call

## 2017-03-28 DIAGNOSIS — F411 Generalized anxiety disorder: Secondary | ICD-10-CM | POA: Diagnosis not present

## 2017-03-28 DIAGNOSIS — R079 Chest pain, unspecified: Secondary | ICD-10-CM | POA: Diagnosis not present

## 2017-03-28 NOTE — Telephone Encounter (Signed)
Called patient with results- he states he is in his PCP office now. I advised I send the results of his BMP/ CT chest to Dr. Kateri Plummer. Results sent via EPIC.

## 2017-04-08 DIAGNOSIS — R52 Pain, unspecified: Secondary | ICD-10-CM | POA: Diagnosis not present

## 2017-04-08 DIAGNOSIS — R079 Chest pain, unspecified: Secondary | ICD-10-CM | POA: Diagnosis not present

## 2017-04-17 ENCOUNTER — Encounter: Payer: Self-pay | Admitting: Internal Medicine

## 2017-04-17 ENCOUNTER — Ambulatory Visit (INDEPENDENT_AMBULATORY_CARE_PROVIDER_SITE_OTHER): Payer: Medicare Other | Admitting: Internal Medicine

## 2017-04-17 ENCOUNTER — Encounter (INDEPENDENT_AMBULATORY_CARE_PROVIDER_SITE_OTHER): Payer: Self-pay

## 2017-04-17 VITALS — BP 118/78 | HR 57 | Ht 72.0 in | Wt 206.0 lb

## 2017-04-17 DIAGNOSIS — I4510 Unspecified right bundle-branch block: Secondary | ICD-10-CM

## 2017-04-17 DIAGNOSIS — I10 Essential (primary) hypertension: Secondary | ICD-10-CM | POA: Diagnosis not present

## 2017-04-17 DIAGNOSIS — I493 Ventricular premature depolarization: Secondary | ICD-10-CM | POA: Diagnosis not present

## 2017-04-17 NOTE — Progress Notes (Signed)
Patient Care Team: Farris HasMorrow, Aaron, MD as PCP - General (Family Medicine)   HPI  Sean Mendoza is a 71 y.o. 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 His PVCs largely queiscent tolerating his medications well The origin of these is not evident in the multiple records that we reviewed.. Still they are not a significant problem most of the time esp if he is exercising frequently   They seem to be aggravated by stress  He has a history of chest pain. He again developed chest pain this time   while moving a piano. It is positional. He had actually seen physical therapy and does some needling  for this with improvement  No dyspnea or peripheral edema  He had  an episode of transient diplopia. He underwent neurological evaluation after which he was told that it was probably a migraine and/or fatigue but not likely  TIA  Aspirin and more aggressive therapy for lipids have been recommended. In this regard we had started him on atorvastatin prior to this evaluation because of high 10 year risk calculation score      Past Medical History:  Diagnosis Date  . Anxiety   . Cough    wert on set 09/2010  . HTN (hypertension)   . Migraine   . PVC's (premature ventricular contractions)     Past Surgical History:  Procedure Laterality Date  . CERVICAL DISCECTOMY    . ELBOW SURGERY     . HERNIA REPAIR      Current Outpatient Prescriptions  Medication Sig Dispense Refill  . atorvastatin (LIPITOR) 20 MG tablet take 1 tablet by mouth once daily 90 tablet 1  . clonazePAM (KLONOPIN) 1 MG tablet Take 1/4 tablet by mouth once daily as needed as directed    . diltiazem (CARDIZEM CD) 120 MG 24 hr capsule take 1 capsule by mouth once daily 90 capsule 3  . FLUoxetine (PROZAC) 20 MG capsule Take 20 mg by mouth daily.      . nadolol (CORGARD) 40 MG tablet Take one tablet (40 mg) by mouth once daily as directed 30 tablet 6   No current facility-administered  medications for this visit.     Allergies  Allergen Reactions  . Ampicillin     REACTION: itching  . Sulfonamide Derivatives     unknown    Review of Systems negative except from HPI and PMH  Physical Exam BP 118/78   Pulse (!) 57   Ht 6' (1.829 m)   Wt 206 lb (93.4 kg)   SpO2 97%   BMI 27.94 kg/m  Well developed and nourished in no acute distress HENT normal Neck supple with JVP-flat Clear Regular rate and rhythm, no murmurs or gallops Chest wall tenderness Abd-soft with active BS No Clubbing cyanosis edema Skin-warm and dry A & Oriented  Grossly normal sensory and motor function  Electrocardiogram demonstrates  NSR 55 Intervals 18/13/46 Right bundle branch block Unchanged  Labs requested from PCp yet ready review  Assessment and  Plan  PVCs  quiescent  HTN    Hypercholesterolemia     RBBB  Chest pain --typical and atypical features  Chest pain is almost certainly musculoskeletal  Blood pressure well controlled on current medications  Tolerating statin therapy -- will have to get from PCP   PVCs quiescent.  Encouraged him to work on his anxiety/stress management

## 2017-04-17 NOTE — Patient Instructions (Signed)
Medication Instructions:  Your physician recommends that you continue on your current medications as directed. Please refer to the Current Medication list given to you today.   Labwork: None ordered  Testing/Procedures: None ordered  Follow-Up:  Your physician wants you to follow-up in: 1 year with Dr. Klein. You will receive a reminder letter in the mail two months in advance. If you don't receive a letter, please call our office to schedule the follow-up appointment.   Any Other Special Instructions Will Be Listed Below (If Applicable).     If you need a refill on your cardiac medications before your next appointment, please call your pharmacy.   

## 2017-04-19 ENCOUNTER — Other Ambulatory Visit: Payer: Self-pay | Admitting: Internal Medicine

## 2017-05-06 ENCOUNTER — Other Ambulatory Visit: Payer: Self-pay | Admitting: Internal Medicine

## 2017-05-16 ENCOUNTER — Other Ambulatory Visit: Payer: Self-pay | Admitting: Internal Medicine

## 2017-06-11 DIAGNOSIS — M1712 Unilateral primary osteoarthritis, left knee: Secondary | ICD-10-CM | POA: Diagnosis not present

## 2017-06-20 DIAGNOSIS — T148XXA Other injury of unspecified body region, initial encounter: Secondary | ICD-10-CM | POA: Diagnosis not present

## 2017-06-20 DIAGNOSIS — G4700 Insomnia, unspecified: Secondary | ICD-10-CM | POA: Diagnosis not present

## 2017-06-20 DIAGNOSIS — J329 Chronic sinusitis, unspecified: Secondary | ICD-10-CM | POA: Diagnosis not present

## 2017-06-20 DIAGNOSIS — M1712 Unilateral primary osteoarthritis, left knee: Secondary | ICD-10-CM | POA: Diagnosis not present

## 2017-06-20 DIAGNOSIS — F411 Generalized anxiety disorder: Secondary | ICD-10-CM | POA: Diagnosis not present

## 2017-06-21 DIAGNOSIS — J329 Chronic sinusitis, unspecified: Secondary | ICD-10-CM | POA: Diagnosis not present

## 2017-06-21 DIAGNOSIS — M26629 Arthralgia of temporomandibular joint, unspecified side: Secondary | ICD-10-CM | POA: Diagnosis not present

## 2017-06-25 DIAGNOSIS — H5310 Unspecified subjective visual disturbances: Secondary | ICD-10-CM | POA: Diagnosis not present

## 2017-06-28 DIAGNOSIS — M1712 Unilateral primary osteoarthritis, left knee: Secondary | ICD-10-CM | POA: Diagnosis not present

## 2017-07-12 DIAGNOSIS — R42 Dizziness and giddiness: Secondary | ICD-10-CM | POA: Diagnosis not present

## 2017-07-12 DIAGNOSIS — J324 Chronic pansinusitis: Secondary | ICD-10-CM | POA: Diagnosis not present

## 2017-07-26 DIAGNOSIS — J343 Hypertrophy of nasal turbinates: Secondary | ICD-10-CM | POA: Diagnosis not present

## 2017-07-26 DIAGNOSIS — J324 Chronic pansinusitis: Secondary | ICD-10-CM | POA: Diagnosis not present

## 2017-07-26 DIAGNOSIS — J3089 Other allergic rhinitis: Secondary | ICD-10-CM | POA: Diagnosis not present

## 2017-07-26 DIAGNOSIS — J342 Deviated nasal septum: Secondary | ICD-10-CM | POA: Diagnosis not present

## 2017-07-26 DIAGNOSIS — J349 Unspecified disorder of nose and nasal sinuses: Secondary | ICD-10-CM | POA: Diagnosis not present

## 2017-08-13 ENCOUNTER — Telehealth: Payer: Self-pay | Admitting: Internal Medicine

## 2017-08-13 NOTE — Telephone Encounter (Signed)
2 separate [facility]es [facility] [address] requesting refills for clonazepam & fluoxetine.   I left a message on the pharmacy line that these will need to be filled by the patient's PCP.

## 2017-09-12 DIAGNOSIS — M25551 Pain in right hip: Secondary | ICD-10-CM | POA: Diagnosis not present

## 2017-09-12 DIAGNOSIS — M1711 Unilateral primary osteoarthritis, right knee: Secondary | ICD-10-CM | POA: Diagnosis not present

## 2017-09-16 DIAGNOSIS — Z85828 Personal history of other malignant neoplasm of skin: Secondary | ICD-10-CM | POA: Diagnosis not present

## 2017-09-16 DIAGNOSIS — Z419 Encounter for procedure for purposes other than remedying health state, unspecified: Secondary | ICD-10-CM | POA: Diagnosis not present

## 2017-09-16 DIAGNOSIS — L570 Actinic keratosis: Secondary | ICD-10-CM | POA: Diagnosis not present

## 2017-09-17 DIAGNOSIS — J00 Acute nasopharyngitis [common cold]: Secondary | ICD-10-CM | POA: Diagnosis not present

## 2017-10-09 ENCOUNTER — Other Ambulatory Visit: Payer: Self-pay | Admitting: *Deleted

## 2017-10-09 MED ORDER — NADOLOL 40 MG PO TABS: 40.0000 mg | ORAL_TABLET | Freq: Every day | ORAL | 2 refills | Status: DC

## 2017-10-31 DIAGNOSIS — R2231 Localized swelling, mass and lump, right upper limb: Secondary | ICD-10-CM | POA: Diagnosis not present

## 2017-10-31 DIAGNOSIS — F411 Generalized anxiety disorder: Secondary | ICD-10-CM | POA: Diagnosis not present

## 2017-10-31 DIAGNOSIS — G4700 Insomnia, unspecified: Secondary | ICD-10-CM | POA: Diagnosis not present

## 2017-11-05 DIAGNOSIS — M799 Soft tissue disorder, unspecified: Secondary | ICD-10-CM | POA: Diagnosis not present

## 2017-11-05 DIAGNOSIS — M79644 Pain in right finger(s): Secondary | ICD-10-CM | POA: Diagnosis not present

## 2017-12-02 DIAGNOSIS — R300 Dysuria: Secondary | ICD-10-CM | POA: Diagnosis not present

## 2017-12-04 DIAGNOSIS — Z125 Encounter for screening for malignant neoplasm of prostate: Secondary | ICD-10-CM | POA: Diagnosis not present

## 2017-12-04 DIAGNOSIS — E7800 Pure hypercholesterolemia, unspecified: Secondary | ICD-10-CM | POA: Diagnosis not present

## 2017-12-04 DIAGNOSIS — I10 Essential (primary) hypertension: Secondary | ICD-10-CM | POA: Diagnosis not present

## 2017-12-05 ENCOUNTER — Other Ambulatory Visit: Payer: Self-pay | Admitting: Family Medicine

## 2017-12-05 DIAGNOSIS — R300 Dysuria: Secondary | ICD-10-CM

## 2017-12-09 ENCOUNTER — Other Ambulatory Visit: Payer: Medicare Other

## 2017-12-09 DIAGNOSIS — L821 Other seborrheic keratosis: Secondary | ICD-10-CM | POA: Diagnosis not present

## 2017-12-09 DIAGNOSIS — L570 Actinic keratosis: Secondary | ICD-10-CM | POA: Diagnosis not present

## 2017-12-09 DIAGNOSIS — Z85828 Personal history of other malignant neoplasm of skin: Secondary | ICD-10-CM | POA: Diagnosis not present

## 2017-12-17 DIAGNOSIS — E785 Hyperlipidemia, unspecified: Secondary | ICD-10-CM | POA: Diagnosis not present

## 2017-12-17 DIAGNOSIS — Z0000 Encounter for general adult medical examination without abnormal findings: Secondary | ICD-10-CM | POA: Diagnosis not present

## 2018-01-02 DIAGNOSIS — G4752 REM sleep behavior disorder: Secondary | ICD-10-CM | POA: Diagnosis not present

## 2018-01-02 DIAGNOSIS — R42 Dizziness and giddiness: Secondary | ICD-10-CM | POA: Diagnosis not present

## 2018-01-03 DIAGNOSIS — H8113 Benign paroxysmal vertigo, bilateral: Secondary | ICD-10-CM | POA: Diagnosis not present

## 2018-01-09 DIAGNOSIS — H8113 Benign paroxysmal vertigo, bilateral: Secondary | ICD-10-CM | POA: Diagnosis not present

## 2018-01-16 DIAGNOSIS — H8113 Benign paroxysmal vertigo, bilateral: Secondary | ICD-10-CM | POA: Diagnosis not present

## 2018-01-19 DIAGNOSIS — G4752 REM sleep behavior disorder: Secondary | ICD-10-CM | POA: Diagnosis not present

## 2018-01-22 DIAGNOSIS — G4752 REM sleep behavior disorder: Secondary | ICD-10-CM | POA: Diagnosis not present

## 2018-03-06 DIAGNOSIS — R42 Dizziness and giddiness: Secondary | ICD-10-CM | POA: Diagnosis not present

## 2018-03-06 DIAGNOSIS — G43109 Migraine with aura, not intractable, without status migrainosus: Secondary | ICD-10-CM | POA: Diagnosis not present

## 2018-03-06 DIAGNOSIS — H903 Sensorineural hearing loss, bilateral: Secondary | ICD-10-CM | POA: Diagnosis not present

## 2018-03-12 DIAGNOSIS — H02403 Unspecified ptosis of bilateral eyelids: Secondary | ICD-10-CM | POA: Diagnosis not present

## 2018-03-26 DIAGNOSIS — G4752 REM sleep behavior disorder: Secondary | ICD-10-CM | POA: Diagnosis not present

## 2018-04-10 ENCOUNTER — Ambulatory Visit: Payer: Medicare Other | Admitting: Internal Medicine

## 2018-04-10 ENCOUNTER — Encounter: Payer: Self-pay | Admitting: Internal Medicine

## 2018-04-10 VITALS — BP 122/84 | HR 54 | Ht 72.0 in | Wt 211.0 lb

## 2018-04-10 DIAGNOSIS — I10 Essential (primary) hypertension: Secondary | ICD-10-CM | POA: Diagnosis not present

## 2018-04-10 DIAGNOSIS — I493 Ventricular premature depolarization: Secondary | ICD-10-CM | POA: Diagnosis not present

## 2018-04-10 DIAGNOSIS — I4510 Unspecified right bundle-branch block: Secondary | ICD-10-CM

## 2018-04-10 NOTE — Progress Notes (Signed)
Patient Care Team: [name], MD as PCP - General (Family Medicine)   HPI  Sean Mendoza is a 72 y.o. 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 His PVCs largely queiscent tolerating his medications well   The patient denies chest pain, shortness of breath, nocturnal dyspnea, orthopnea or peripheral edema.  There have been no palpitations, lightheadedness or syncope. \  His most recent issue has been vertigo.  He has been evaluated for this by neurology and ENT.  Blood pressures in the home have been 130      Past Medical History:  Diagnosis Date  . Anxiety   . Cough    wert on set 09/2010  . HTN (hypertension)   . Migraine   . PVC's (premature ventricular contractions)     Past Surgical History:  Procedure Laterality Date  . CERVICAL DISCECTOMY    . ELBOW SURGERY     . HERNIA REPAIR      Current Outpatient Medications  Medication Sig Dispense Refill  . atorvastatin (LIPITOR) 20 MG tablet take 1 tablet by mouth once daily 90 tablet 3  . clonazePAM (KLONOPIN) 1 MG tablet Take 0.5 mg by mouth daily.     Marland Kitchen diltiazem (CARDIZEM CD) 120 MG 24 hr capsule Take 1 capsule (120 mg total) by mouth daily. 90 capsule 3  . FLUoxetine (PROZAC) 20 MG capsule Take 20 mg by mouth daily.      Marland Kitchen levETIRAcetam (KEPPRA XR) 500 MG 24 hr tablet Take 1 tablet by mouth daily.    . Melatonin 5 MG TABS Take 1 tablet by mouth at bedtime.    . nadolol (CORGARD) 40 MG tablet Take 20 mg by mouth daily.     No current facility-administered medications for this visit.     Allergies  Allergen Reactions  . Ampicillin     REACTION: itching  . Sulfonamide Derivatives     unknown    Review of Systems negative except from HPI and PMH  Physical Exam BP 122/84   Pulse (!) 54   Ht 6' (1.829 m)   Wt 211 lb (95.7 kg)   SpO2 97%   BMI 28.62 kg/m  Well developed and nourished in no acute distress HENT normal Neck supple with JVP-flat Clear Regular  rate and rhythm, no murmurs or gallops Abd-soft with active BS No Clubbing cyanosis edema Skin-warm and dry A & Oriented  Grossly normal sensory and motor function   Electrocardiogram Personally reviewed   54 17/13/46 RBBB  Assessment and  Plan  PVCs  quiescent  HTN    Hypercholesterolemia     RBBB   PVCs remain quiet.  Blood pressure is well controlled.  Not sure the mechanism of his vertigo.  He is being followed by neurology.

## 2018-04-10 NOTE — Patient Instructions (Signed)

## 2018-04-19 DIAGNOSIS — J0100 Acute maxillary sinusitis, unspecified: Secondary | ICD-10-CM | POA: Diagnosis not present

## 2018-04-21 DIAGNOSIS — H02413 Mechanical ptosis of bilateral eyelids: Secondary | ICD-10-CM | POA: Diagnosis not present

## 2018-04-21 DIAGNOSIS — H02831 Dermatochalasis of right upper eyelid: Secondary | ICD-10-CM | POA: Diagnosis not present

## 2018-04-21 DIAGNOSIS — H02423 Myogenic ptosis of bilateral eyelids: Secondary | ICD-10-CM | POA: Diagnosis not present

## 2018-04-21 DIAGNOSIS — H02834 Dermatochalasis of left upper eyelid: Secondary | ICD-10-CM | POA: Diagnosis not present

## 2018-04-23 ENCOUNTER — Telehealth: Payer: Self-pay | Admitting: Internal Medicine

## 2018-04-23 ENCOUNTER — Other Ambulatory Visit: Payer: Self-pay | Admitting: Internal Medicine

## 2018-04-23 MED ORDER — DILTIAZEM HCL ER COATED BEADS 120 MG PO CP24: 120.0000 mg | ORAL_CAPSULE | Freq: Every day | ORAL | 3 refills | Status: DC

## 2018-04-23 MED ORDER — ATORVASTATIN CALCIUM 20 MG PO TABS: 20.0000 mg | ORAL_TABLET | Freq: Every day | ORAL | 3 refills | Status: DC

## 2018-04-23 NOTE — Telephone Encounter (Signed)
°*  STAT* If patient is at the pharmacy, call can be transferred to refill team.   1. Which medications need to be refilled? (please list name of each medication and dose if known)  Atorvastatin  Diltiazem cd   2. Which pharmacy/location (including street and city if local pharmacy) [facility]  3. Do they need a 30 day or 90 day supply? 90day

## 2018-04-23 NOTE — Telephone Encounter (Signed)
Pt's  Medication was sent to pt's pharmacy as requested. Confirmation received.  °

## 2018-04-23 NOTE — Addendum Note (Signed)
Addended by: Demetrios Loll on: 04/23/2018 12:12 PM   Modules accepted: Orders

## 2018-04-23 NOTE — Telephone Encounter (Signed)
Pt's medication was sent to pt's pharmacy as requested. Confirmation received.  °

## 2018-05-12 DIAGNOSIS — H02834 Dermatochalasis of left upper eyelid: Secondary | ICD-10-CM | POA: Diagnosis not present

## 2018-05-12 DIAGNOSIS — H02831 Dermatochalasis of right upper eyelid: Secondary | ICD-10-CM | POA: Diagnosis not present

## 2018-05-27 DIAGNOSIS — L570 Actinic keratosis: Secondary | ICD-10-CM | POA: Diagnosis not present

## 2018-05-27 DIAGNOSIS — Z85828 Personal history of other malignant neoplasm of skin: Secondary | ICD-10-CM | POA: Diagnosis not present

## 2018-05-27 DIAGNOSIS — L821 Other seborrheic keratosis: Secondary | ICD-10-CM | POA: Diagnosis not present

## 2018-06-16 DIAGNOSIS — G43909 Migraine, unspecified, not intractable, without status migrainosus: Secondary | ICD-10-CM | POA: Diagnosis not present

## 2018-06-16 DIAGNOSIS — E785 Hyperlipidemia, unspecified: Secondary | ICD-10-CM | POA: Diagnosis not present

## 2018-06-16 DIAGNOSIS — I10 Essential (primary) hypertension: Secondary | ICD-10-CM | POA: Diagnosis not present

## 2018-06-16 DIAGNOSIS — H539 Unspecified visual disturbance: Secondary | ICD-10-CM | POA: Diagnosis not present

## 2018-06-16 DIAGNOSIS — H53123 Transient visual loss, bilateral: Secondary | ICD-10-CM | POA: Diagnosis not present

## 2018-06-16 DIAGNOSIS — H02831 Dermatochalasis of right upper eyelid: Secondary | ICD-10-CM | POA: Diagnosis not present

## 2018-06-16 DIAGNOSIS — R350 Frequency of micturition: Secondary | ICD-10-CM | POA: Diagnosis not present

## 2018-06-17 DIAGNOSIS — G453 Amaurosis fugax: Secondary | ICD-10-CM | POA: Diagnosis not present

## 2018-06-19 ENCOUNTER — Other Ambulatory Visit (HOSPITAL_COMMUNITY): Payer: Self-pay | Admitting: Neurology

## 2018-06-19 ENCOUNTER — Other Ambulatory Visit: Payer: Self-pay

## 2018-06-19 ENCOUNTER — Ambulatory Visit (HOSPITAL_COMMUNITY): Payer: Medicare Other | Attending: Cardiovascular Disease

## 2018-06-19 DIAGNOSIS — Z87891 Personal history of nicotine dependence: Secondary | ICD-10-CM | POA: Diagnosis not present

## 2018-06-19 DIAGNOSIS — Z8249 Family history of ischemic heart disease and other diseases of the circulatory system: Secondary | ICD-10-CM | POA: Diagnosis not present

## 2018-06-19 DIAGNOSIS — G453 Amaurosis fugax: Secondary | ICD-10-CM | POA: Insufficient documentation

## 2018-06-19 DIAGNOSIS — I071 Rheumatic tricuspid insufficiency: Secondary | ICD-10-CM | POA: Diagnosis not present

## 2018-06-19 DIAGNOSIS — Z981 Arthrodesis status: Secondary | ICD-10-CM | POA: Diagnosis not present

## 2018-06-19 DIAGNOSIS — I4510 Unspecified right bundle-branch block: Secondary | ICD-10-CM | POA: Diagnosis not present

## 2018-06-19 DIAGNOSIS — I10 Essential (primary) hypertension: Secondary | ICD-10-CM | POA: Diagnosis not present

## 2018-06-20 ENCOUNTER — Telehealth: Payer: Self-pay | Admitting: Internal Medicine

## 2018-06-20 NOTE — Telephone Encounter (Signed)
Pt's neurologist ordered the Echo. I advised pt to speak with the ordering MD. If Dr Fara OldenSeaux needs further advisement from Dr Graciela HusbandsKlein, he may forward them to Dr Graciela HusbandsKlein.   Pt verbalized understanding and had no additional questions.

## 2018-06-20 NOTE — Telephone Encounter (Signed)
Follow up   [facility] calling back to request echo. Tyron Russelldvised Annette to fax to Denny PeonKim M. [phone])

## 2018-06-20 NOTE — Telephone Encounter (Signed)
Pt calling and wanting nurse to give him results of Echo because [facility] has closed at 12pm.

## 2018-06-20 NOTE — Telephone Encounter (Signed)
Letter head received. Will fax records.

## 2018-06-20 NOTE — Telephone Encounter (Signed)
New Message      Dr. Fara OldenSeaux is  Calling for the  Echocardiogram 2-D complete results. Spoke with  Drinda Buttsnnette her fax # is  Fax [phone]. Their office closes at noon today and Dr. Fara OldenSeaux will be on vacation next week they would like results as soon as possible.

## 2018-07-18 DIAGNOSIS — M1712 Unilateral primary osteoarthritis, left knee: Secondary | ICD-10-CM | POA: Diagnosis not present

## 2018-07-23 DIAGNOSIS — M1712 Unilateral primary osteoarthritis, left knee: Secondary | ICD-10-CM | POA: Diagnosis not present

## 2018-07-30 DIAGNOSIS — M1712 Unilateral primary osteoarthritis, left knee: Secondary | ICD-10-CM | POA: Diagnosis not present

## 2018-08-25 DIAGNOSIS — H02834 Dermatochalasis of left upper eyelid: Secondary | ICD-10-CM | POA: Diagnosis not present

## 2018-08-25 DIAGNOSIS — H02413 Mechanical ptosis of bilateral eyelids: Secondary | ICD-10-CM | POA: Diagnosis not present

## 2018-08-25 DIAGNOSIS — H02831 Dermatochalasis of right upper eyelid: Secondary | ICD-10-CM | POA: Diagnosis not present

## 2018-08-25 DIAGNOSIS — H02423 Myogenic ptosis of bilateral eyelids: Secondary | ICD-10-CM | POA: Diagnosis not present

## 2018-09-12 DIAGNOSIS — G4752 REM sleep behavior disorder: Secondary | ICD-10-CM | POA: Diagnosis not present

## 2018-09-12 DIAGNOSIS — G453 Amaurosis fugax: Secondary | ICD-10-CM | POA: Diagnosis not present

## 2018-09-25 IMAGING — CT CT ANGIO CHEST
2 of 9 series · 19 of 46 positions shown · IV contrast (ISOVUE 370)
Comparison: None.

CLINICAL DATA: Chest pain.

EXAM:
CT ANGIOGRAPHY CHEST WITH CONTRAST
TECHNIQUE: Multidetector CT imaging of the chest was performed using the
standard protocol during bolus administration of intravenous
contrast. Multiplanar CT image reconstructions and MIPs were
obtained to evaluate the vascular anatomy.
CONTRAST:  80 mL of Isovue 370 intravenously.

[Series 6: thins · axial · 0.70mm/px · z∈[-251,+1]mm · 16 of 284 slices shown]
[im 16/284  lung]
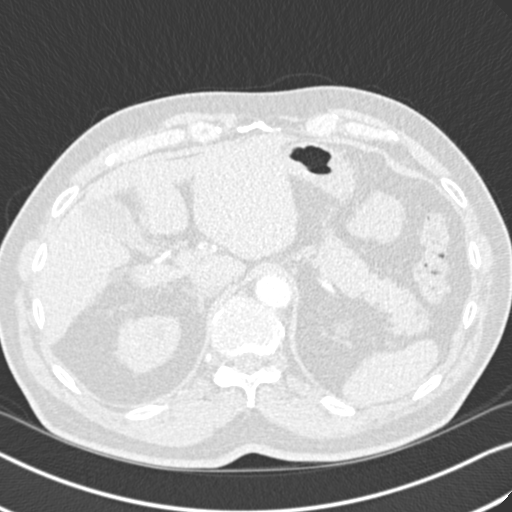
[im 32/284  soft-tissue]
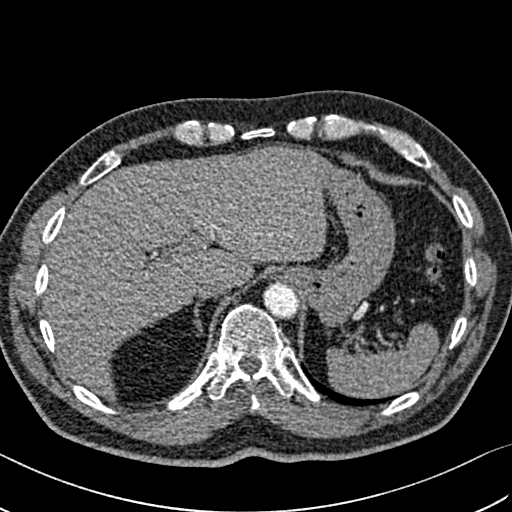
[im 48/284  lung]
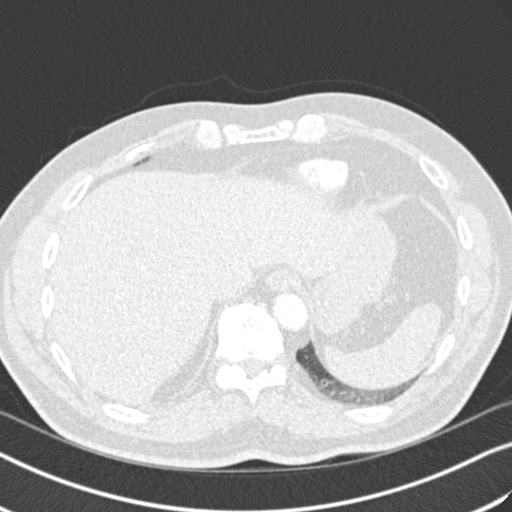
[im 63/284  soft-tissue]
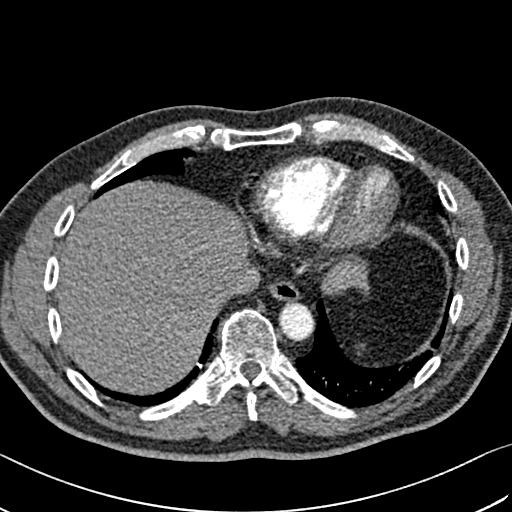
[im 79/284  lung]
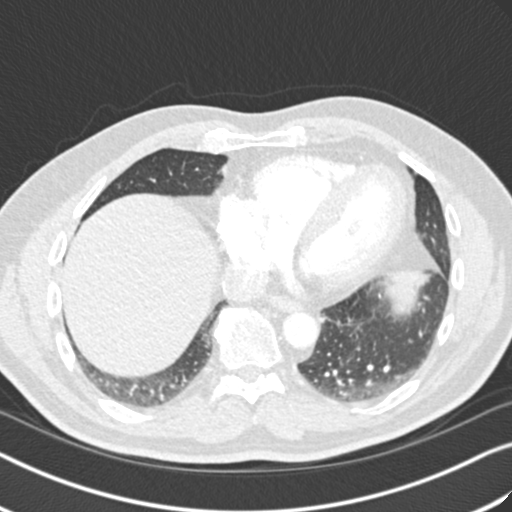
[im 95/284  soft-tissue]
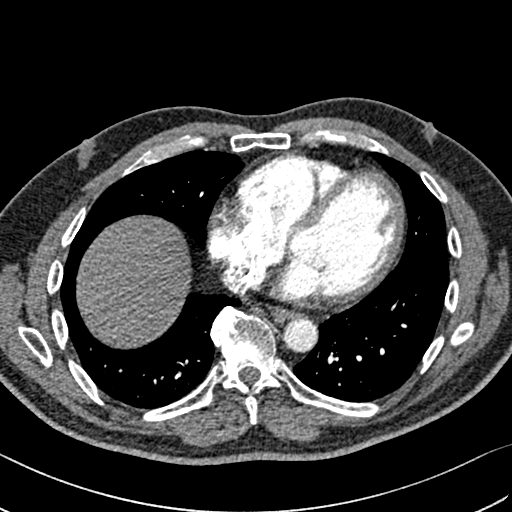
[im 111/284  lung]
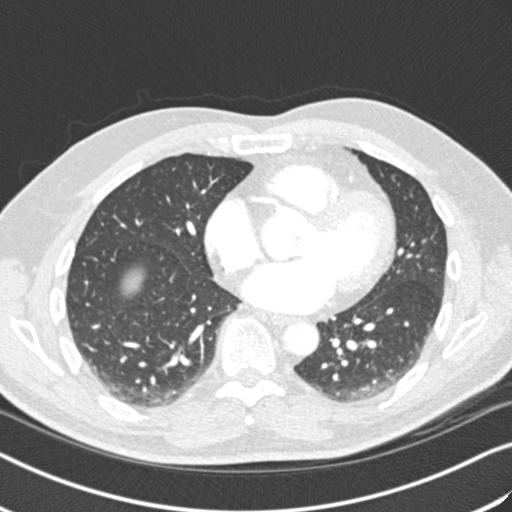
[im 126/284  soft-tissue]
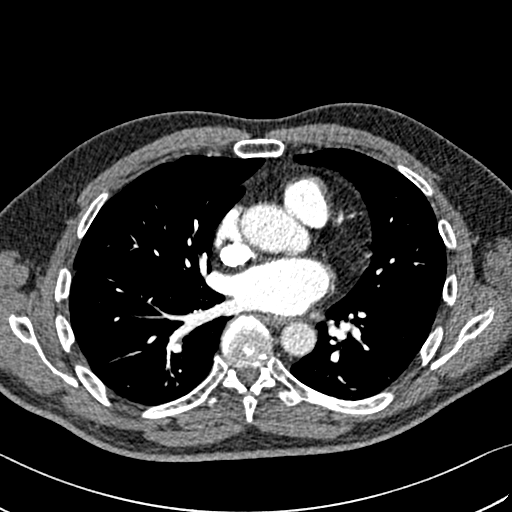
[im 158/284  lung]
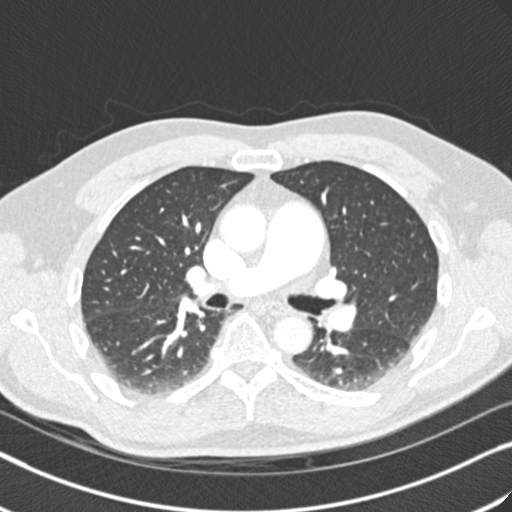
[im 173/284  soft-tissue]
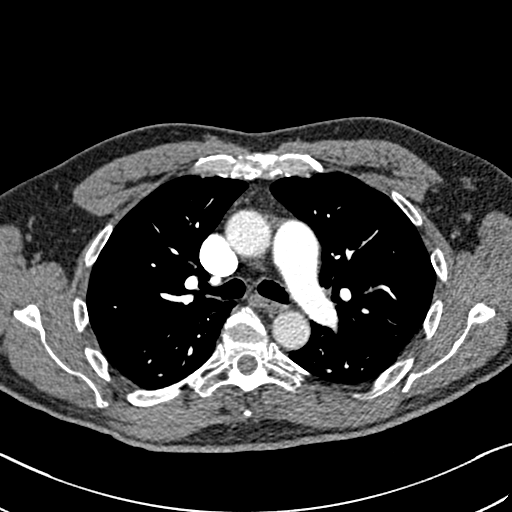
[im 189/284  lung]
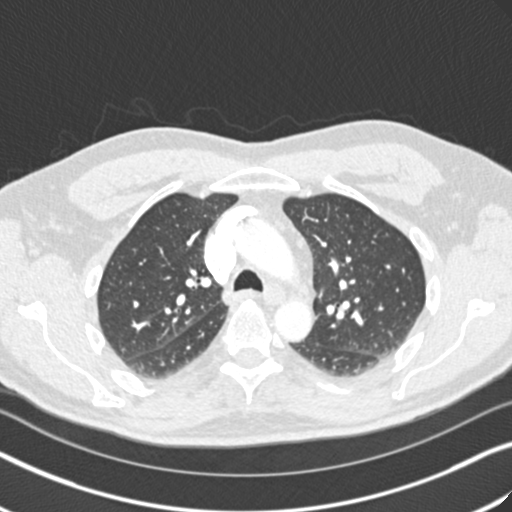
[im 205/284  soft-tissue]
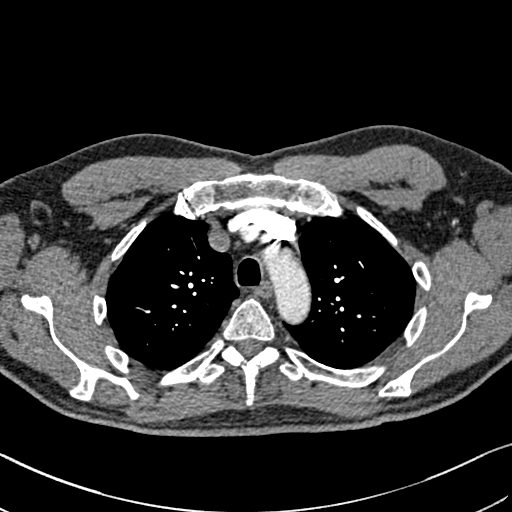
[im 221/284  lung]
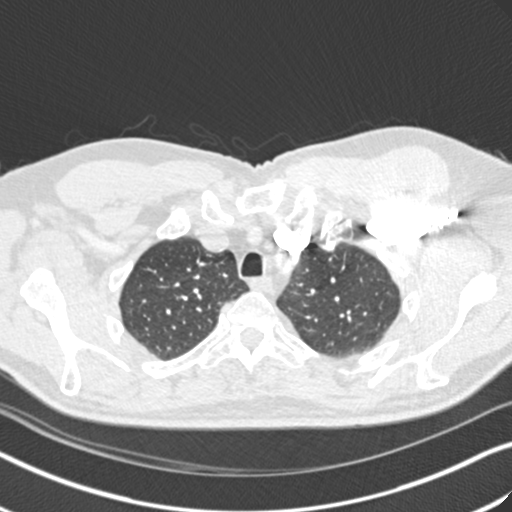
[im 236/284  soft-tissue]
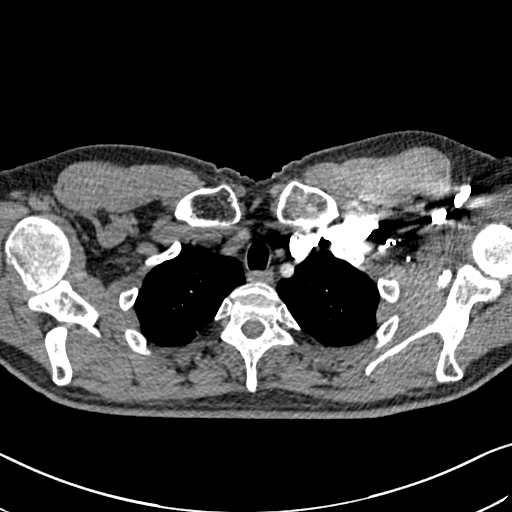
[im 252/284  lung]
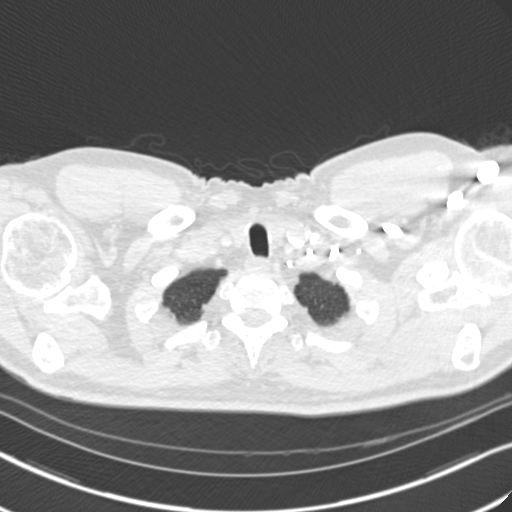
[im 268/284  soft-tissue]
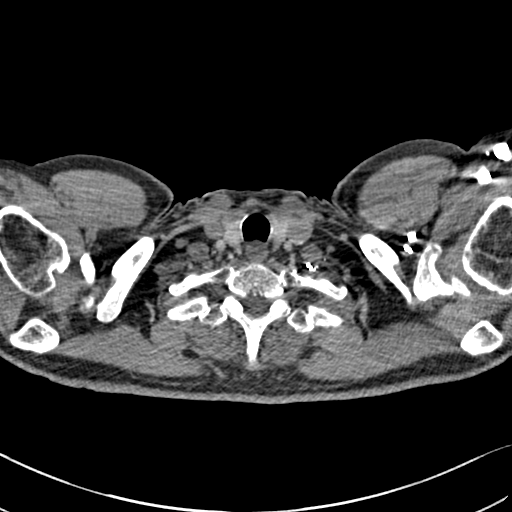

[Series 8: coronal mpr · coronal · 0.59mm/px · 3 of 115 slices shown]
[im 29/115  soft-tissue]
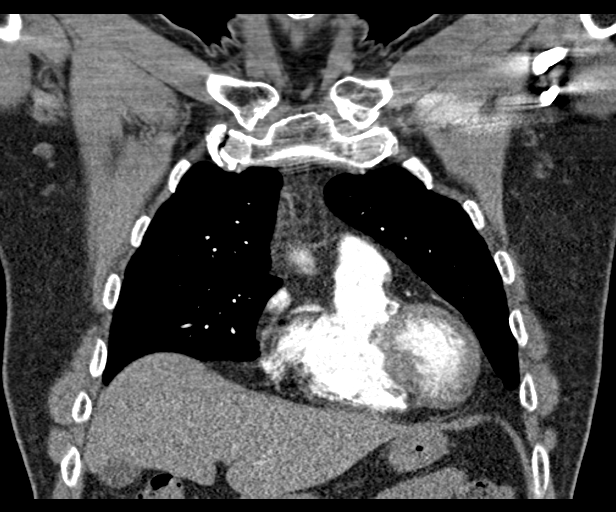
[im 58/115  soft-tissue]
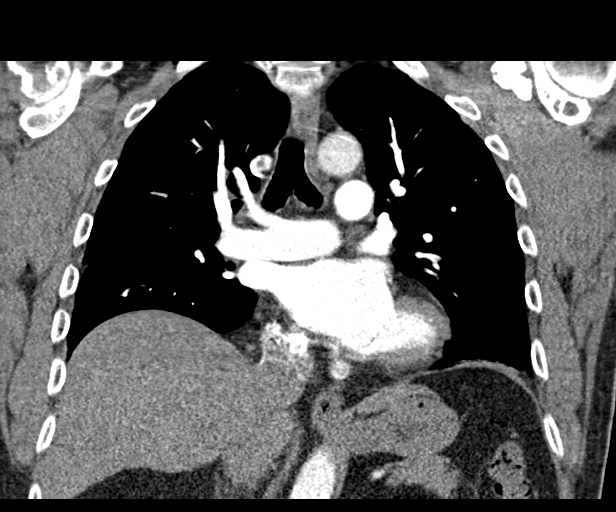
[im 86/115  soft-tissue]
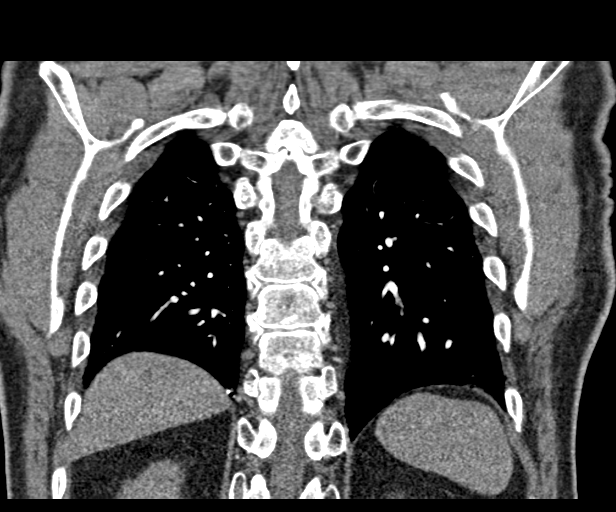

[19 of 46 positions shown; findings below may reference images not displayed]

FINDINGS: Cardiovascular: Satisfactory opacification of the pulmonary arteries
to the segmental level. No evidence of pulmonary embolism. Normal
heart size. No pericardial effusion.

Mediastinum/Nodes: No enlarged mediastinal, hilar, or axillary lymph
nodes. Thyroid gland, trachea, and esophagus demonstrate no
significant findings.

Lungs/Pleura: Lungs are clear. No pleural effusion or pneumothorax.

Upper Abdomen: No acute abnormality.

Musculoskeletal: No chest wall abnormality. No acute or significant
osseous findings.

Review of the MIP images confirms the above findings.
IMPRESSION: No evidence of pulmonary embolus. No acute abnormality seen in the
chest.

## 2018-10-20 DIAGNOSIS — H57813 Brow ptosis, bilateral: Secondary | ICD-10-CM | POA: Diagnosis not present

## 2018-10-20 DIAGNOSIS — H02423 Myogenic ptosis of bilateral eyelids: Secondary | ICD-10-CM | POA: Diagnosis not present

## 2018-10-20 DIAGNOSIS — H02831 Dermatochalasis of right upper eyelid: Secondary | ICD-10-CM | POA: Diagnosis not present

## 2018-10-20 DIAGNOSIS — H02413 Mechanical ptosis of bilateral eyelids: Secondary | ICD-10-CM | POA: Diagnosis not present

## 2018-10-20 DIAGNOSIS — H53453 Other localized visual field defect, bilateral: Secondary | ICD-10-CM | POA: Diagnosis not present

## 2018-10-20 DIAGNOSIS — H02834 Dermatochalasis of left upper eyelid: Secondary | ICD-10-CM | POA: Diagnosis not present

## 2018-12-18 ENCOUNTER — Telehealth: Payer: Self-pay

## 2018-12-18 ENCOUNTER — Other Ambulatory Visit: Payer: Self-pay

## 2018-12-18 MED ORDER — NADOLOL 40 MG PO TABS: ORAL_TABLET | ORAL | 3 refills | Status: DC

## 2018-12-18 NOTE — Telephone Encounter (Signed)
[facility]... RX sent in nadolol 40mg  a day.. pt says he is taking a whole tab but sometimes he cuts it in half based on his symptoms but says he has been doing very well on it.

## 2018-12-22 DIAGNOSIS — E785 Hyperlipidemia, unspecified: Secondary | ICD-10-CM | POA: Diagnosis not present

## 2018-12-22 DIAGNOSIS — Z0000 Encounter for general adult medical examination without abnormal findings: Secondary | ICD-10-CM | POA: Diagnosis not present

## 2018-12-22 DIAGNOSIS — R413 Other amnesia: Secondary | ICD-10-CM | POA: Diagnosis not present

## 2018-12-22 DIAGNOSIS — R001 Bradycardia, unspecified: Secondary | ICD-10-CM | POA: Diagnosis not present

## 2018-12-22 DIAGNOSIS — I10 Essential (primary) hypertension: Secondary | ICD-10-CM | POA: Diagnosis not present

## 2018-12-24 DIAGNOSIS — I499 Cardiac arrhythmia, unspecified: Secondary | ICD-10-CM | POA: Diagnosis not present

## 2018-12-25 ENCOUNTER — Telehealth: Payer: Self-pay | Admitting: Internal Medicine

## 2018-12-25 NOTE — Telephone Encounter (Signed)
Per Andrey Campanile at [facility], Pt presented for EKG yesterday and was in NSB with HR of 59. Pt did not want to discontinue carvedilol at this time.

## 2018-12-25 NOTE — Telephone Encounter (Signed)
New message     Andrey Campanile from [facility]  [phone] stated that she wants to update DR. Graciela Husbands on pt EKG.

## 2019-01-06 DIAGNOSIS — J019 Acute sinusitis, unspecified: Secondary | ICD-10-CM | POA: Diagnosis not present

## 2019-01-06 DIAGNOSIS — J069 Acute upper respiratory infection, unspecified: Secondary | ICD-10-CM | POA: Diagnosis not present

## 2019-01-07 DIAGNOSIS — H9313 Tinnitus, bilateral: Secondary | ICD-10-CM | POA: Diagnosis not present

## 2019-01-07 DIAGNOSIS — H903 Sensorineural hearing loss, bilateral: Secondary | ICD-10-CM | POA: Diagnosis not present

## 2019-02-26 DIAGNOSIS — H02423 Myogenic ptosis of bilateral eyelids: Secondary | ICD-10-CM | POA: Diagnosis not present

## 2019-02-26 DIAGNOSIS — Z09 Encounter for follow-up examination after completed treatment for conditions other than malignant neoplasm: Secondary | ICD-10-CM | POA: Diagnosis not present

## 2019-03-17 DIAGNOSIS — Z8673 Personal history of transient ischemic attack (TIA), and cerebral infarction without residual deficits: Secondary | ICD-10-CM | POA: Diagnosis not present

## 2019-03-17 DIAGNOSIS — G4752 REM sleep behavior disorder: Secondary | ICD-10-CM | POA: Diagnosis not present

## 2019-03-17 DIAGNOSIS — G43109 Migraine with aura, not intractable, without status migrainosus: Secondary | ICD-10-CM | POA: Diagnosis not present

## 2019-04-09 ENCOUNTER — Telehealth: Payer: Self-pay

## 2019-04-09 NOTE — Telephone Encounter (Signed)
Virtual Visit Pre-Appointment Phone Call  "(Name), I am calling you today to discuss your upcoming appointment. We are currently trying to limit exposure to the virus that causes COVID-19 by seeing patients at home rather than in the office."  1. "What is the BEST phone number to call the day of the visit?" - include this in appointment notes  2. "Do you have or have access to (through a family member/friend) a smartphone with video capability that we can use for your visit?" a. If yes - list this number in appt notes as "cell" (if different from BEST phone #) and list the appointment type as a VIDEO visit in appointment notes b. If no - list the appointment type as a PHONE visit in appointment notes  3. Confirm consent - "In the setting of the current Covid19 crisis, you are scheduled for a (phone or video) visit with your provider on (date) at (time).  Just as we do with many in-office visits, in order for you to participate in this visit, we must obtain consent.  If you'd like, I can send this to your mychart (if signed up) or email for you to review.  Otherwise, I can obtain your verbal consent now.  All virtual visits are billed to your insurance company just like a normal visit would be.  By agreeing to a virtual visit, we'd like you to understand that the technology does not allow for your provider to perform an examination, and thus may limit your provider's ability to fully assess your condition. If your provider identifies any concerns that need to be evaluated in person, we will make arrangements to do so.  Finally, though the technology is pretty good, we cannot assure that it will always work on either your or our end, and in the setting of a video visit, we may have to convert it to a phone-only visit.  In either situation, we cannot ensure that we have a secure connection.  Are you willing to proceed?" STAFF: Did the patient verbally acknowledge consent to telehealth visit? Document  YES/NO here: YES  4. Advise patient to be prepared - "Two hours prior to your appointment, go ahead and check your blood pressure, pulse, oxygen saturation, and your weight (if you have the equipment to check those) and write them all down. When your visit starts, your provider will ask you for this information. If you have an Apple Watch or Kardia device, please plan to have heart rate information ready on the day of your appointment. Please have a pen and paper handy nearby the day of the visit as well."  5. Give patient instructions for MyChart download to smartphone OR Doximity/Doxy.me as below if video visit (depending on what platform provider is using)  6. Inform patient they will receive a phone call 15 minutes prior to their appointment time (may be from unknown caller ID) so they should be prepared to answer    TELEPHONE CALL NOTE  Sean Mendoza has been deemed a candidate for a follow-up tele-health visit to limit community exposure during the Covid-19 pandemic. I spoke with the patient via phone to ensure availability of phone/video source, confirm preferred email & phone number, and discuss instructions and expectations.  I reminded Sean Mendoza to be prepared with any vital sign and/or heart rhythm information that could potentially be obtained via home monitoring, at the time of his visit. I reminded Sean Mendoza to expect a phone call prior to  his visit.  Lajoyce Cornersmily R Jashua Knaak, CMA 04/09/2019 10:25 AM   INSTRUCTIONS FOR DOWNLOADING THE MYCHART APP TO SMARTPHONE  - The patient must first make sure to have activated MyChart and know their login information - If Apple, go to [employer] Store and type in MyChart in the search bar and download the app. If Android, ask patient to go to [employer] Play Store and type in [facility] in the search bar and download the app. The app is free but as with any other app downloads, their phone may require them to verify saved payment information or  Apple/Android password.  - The patient will need to then log into the app with their MyChart username and password, and select [facility] as their healthcare provider to link the account. When it is time for your visit, go to the MyChart app, find appointments, and click Begin Video Visit. Be sure to Select Allow for your device to access the Microphone and Camera for your visit. You will then be connected, and your provider will be with you shortly.  **If they have any issues connecting, or need assistance please contact MyChart service desk [phone])**  **If using a computer, in order to ensure the best quality for their visit they will need to use either of the following Internet Browsers: [employer] Edge, or Google Chrome**  IF USING DOXIMITY or DOXY.ME - The patient will receive a link just prior to their visit by text.     FULL LENGTH CONSENT FOR TELE-HEALTH VISIT   I hereby voluntarily request, consent and authorize [facility] and its employed or contracted physicians, physician assistants, nurse practitioners or other licensed health care professionals (the Practitioner), to provide me with telemedicine health care services (the "Services") as deemed necessary by the treating Practitioner. I acknowledge and consent to receive the Services by the Practitioner via telemedicine. I understand that the telemedicine visit will involve communicating with the Practitioner through live audiovisual communication technology and the disclosure of certain medical information by electronic transmission. I acknowledge that I have been given the opportunity to request an in-person assessment or other available alternative prior to the telemedicine visit and am voluntarily participating in the telemedicine visit.  I understand that I have the right to withhold or withdraw my consent to the use of telemedicine in the course of my care at any time, without affecting my right to future care  or treatment, and that the Practitioner or I may terminate the telemedicine visit at any time. I understand that I have the right to inspect all information obtained and/or recorded in the course of the telemedicine visit and may receive copies of available information for a reasonable fee.  I understand that some of the potential risks of receiving the Services via telemedicine include:  Marland Kitchen. Delay or interruption in medical evaluation due to technological equipment failure or disruption; . Information transmitted may not be sufficient (e.g. poor resolution of images) to allow for appropriate medical decision making by the Practitioner; and/or  . In rare instances, security protocols could fail, causing a breach of personal health information.  Furthermore, I acknowledge that it is my responsibility to provide information about my medical history, conditions and care that is complete and accurate to the best of my ability. I acknowledge that Practitioner's advice, recommendations, and/or decision may be based on factors not within their control, such as incomplete or inaccurate data provided by me or distortions of diagnostic images or specimens that may result from electronic transmissions.  I understand that the practice of medicine is not an exact science and that Practitioner makes no warranties or guarantees regarding treatment outcomes. I acknowledge that I will receive a copy of this consent concurrently upon execution via email to the email address I last provided but may also request a printed copy by calling the office of [facility].    I understand that my insurance will be billed for this visit.   I have read or had this consent read to me. . I understand the contents of this consent, which adequately explains the benefits and risks of the Services being provided via telemedicine.  . I have been provided ample opportunity to ask questions regarding this consent and the Services and have had  my questions answered to my satisfaction. . I give my informed consent for the services to be provided through the use of telemedicine in my medical care  By participating in this telemedicine visit I agree to the above.

## 2019-04-14 ENCOUNTER — Other Ambulatory Visit: Payer: Self-pay

## 2019-04-14 ENCOUNTER — Encounter: Payer: Self-pay | Admitting: Internal Medicine

## 2019-04-14 ENCOUNTER — Telehealth (INDEPENDENT_AMBULATORY_CARE_PROVIDER_SITE_OTHER): Payer: Medicare Other | Admitting: Internal Medicine

## 2019-04-14 VITALS — HR 50 | Ht 72.0 in | Wt 202.0 lb

## 2019-04-14 DIAGNOSIS — I10 Essential (primary) hypertension: Secondary | ICD-10-CM

## 2019-04-14 DIAGNOSIS — I4510 Unspecified right bundle-branch block: Secondary | ICD-10-CM

## 2019-04-14 DIAGNOSIS — I493 Ventricular premature depolarization: Secondary | ICD-10-CM | POA: Diagnosis not present

## 2019-04-14 NOTE — Progress Notes (Signed)
Electrophysiology TeleHealth Note   Due to national recommendations of social distancing due to COVID 19, an audio/video telehealth visit is felt to be most appropriate for this patient at this time.  See MyChart message from today for the patient's consent to telehealth for [facility].   Date:  04/14/2019   ID:  Sean LipaStephen W Mendoza, DOB [birth year], MRN [id number]  Location: patient's home  Provider location: [address], [place]  Evaluation Performed: Follow-up visit  PCP:  Farris HasMorrow, Aaron, MD  Car * Electrophysiologist:  SK   Chief Complaint: palpitations  History of Present Illness:    Sean LipaStephen W Mendoza is a 73 y.o. male who presents via audio/video conferencing for a telehealth visit today.  Since last being seen in our clinic for PVCs  the patient reports doing quite well  The patient denies SOB, chest pain edema or palpitations.  There has been no syncope or presyncope.  Has a recumbent cough ? PND but wife thinks it may be medication related   7/19 Echo normal-- for amaurosis fugax RX wi ASA and then stopped  Neuro ([facility]) notes reviewed  Also has REM behavioural disorder and migraine HA   The patient denies symptoms of fevers, chills, cough, or new SOB worrisome for COVID 19.    Past Medical History:  Diagnosis Date  . Anxiety   . Cough    wert on set 09/2010  . HTN (hypertension)   . Migraine   . PVC's (premature ventricular contractions)     Past Surgical History:  Procedure Laterality Date  . CERVICAL DISCECTOMY    . ELBOW SURGERY     . HERNIA REPAIR      Current Outpatient Medications  Medication Sig Dispense Refill  . aspirin EC 81 MG tablet Take 81 mg by mouth daily.    Marland Kitchen. atorvastatin (LIPITOR) 20 MG tablet Take 1 tablet (20 mg total) by mouth daily. 90 tablet 3  . clonazePAM (KLONOPIN) 1 MG tablet Take 0.5 mg by mouth daily.     Marland Kitchen. diltiazem (CARDIZEM CD) 120 MG 24 hr capsule Take 1 capsule (120 mg total) by mouth daily. 90  capsule 3  . FLUoxetine (PROZAC) 20 MG capsule Take 20 mg by mouth daily.      Marland Kitchen. levETIRAcetam (KEPPRA XR) 500 MG 24 hr tablet Take 1 tablet by mouth daily.    . Melatonin 5 MG TABS Take 1 tablet by mouth at bedtime.    . nadolol (CORGARD) 40 MG tablet Take 1/2-1 tablet by mouth once a day for palpitations. 90 tablet 3  . vitamin B-12 (CYANOCOBALAMIN) 1000 MCG tablet Take 1,000 mcg by mouth daily.     No current facility-administered medications for this visit.     Allergies:   Ampicillin and Sulfonamide derivatives   Social History:  The patient  reports that he has never smoked. He has never used smokeless tobacco. He reports current alcohol use. He reports that he does not use drugs.   Family History:  The patient's   family history includes Heart disease in his father.   ROS:  Please see the history of present illness.   All other systems are personally reviewed and negative.    Exam:    Vital Signs:  Pulse (!) 50   Ht 6' (1.829 m)   Wt 202 lb (91.6 kg)   BMI 27.40 kg/m    Well appearing, alert and conversant, regular work of breathing,  good skin color Eyes- anicteric,  neuro- grossly intact, skin- no apparent rash or lesions or cyanosis, mouth- oral mucosa is pink   Labs/Other Tests and Data Reviewed:    Recent Labs: No results found for requested labs within last 8760 hours.   Wt Readings from Last 3 Encounters:  04/14/19 202 lb (91.6 kg)  04/10/18 211 lb (95.7 kg)  04/17/17 206 lb (93.4 kg)     Other studies personally reviewed: Additional studies/ records that were reviewed today include: *notes from [facility]      ASSESSMENT & PLAN:    PVCs     HTN    Hypercholesterolemia     RBBB  PVC queiscient  BP well controlled when he goes to offices, but does not check at home  Handling the stress of COVID     COVID 19 screen The patient denies symptoms of COVID 19 at this time.  The importance of social distancing was discussed today.  Follow-up:   12 m    Current medicines are reviewed at length with the patient today.   The patient does not have concerns regarding his medicines.  The following changes were made today:   Offered a drug exclusion trial, but he has declined   Labs/ tests ordered today include:   No orders of the defined types were placed in this encounter.   Future tests ( post COVID )      Patient Risk:  after full review of this patients clinical status, I feel that they are at moderate  risk at this time.  Today, I have spent *19 minutes with the patient with telehealth technology discussing the above.  Signed, Sherryl Manges, MD  04/14/2019 1:45 PM     [facility] [address] [place] [address] [phone] (office) [phone] (fax)

## 2019-04-23 ENCOUNTER — Telehealth: Payer: Self-pay | Admitting: Cardiology

## 2019-04-24 ENCOUNTER — Other Ambulatory Visit: Payer: Self-pay | Admitting: Internal Medicine

## 2019-04-24 ENCOUNTER — Other Ambulatory Visit: Payer: Self-pay

## 2019-04-24 MED ORDER — DILTIAZEM HCL ER COATED BEADS 120 MG PO CP24: 120.0000 mg | ORAL_CAPSULE | Freq: Every day | ORAL | 3 refills | Status: DC

## 2019-04-24 MED ORDER — ATORVASTATIN CALCIUM 20 MG PO TABS: 20.0000 mg | ORAL_TABLET | Freq: Every day | ORAL | 3 refills | Status: DC

## 2019-04-24 NOTE — Telephone Encounter (Signed)
Pt's medication was sent to pt's pharmacy as requested. Confirmation received.  °

## 2019-04-24 NOTE — Telephone Encounter (Signed)
°*  STAT* If patient is at the pharmacy, call can be transferred to refill team.   1. Which medications need to be refilled? (please list name of each medication and dose if known) diltiazem (CARDIZEM CD) 120 MG 24 hr capsule  2. Which pharmacy/location (including street and city if local pharmacy) is medication to be sent to?  [id number] [place] [place] [facility] [address] [phone] (Phone) [phone] (Fax)    3. Do they need a 30 day or 90 day supply? 90

## 2019-06-17 DIAGNOSIS — Z85828 Personal history of other malignant neoplasm of skin: Secondary | ICD-10-CM | POA: Diagnosis not present

## 2019-06-17 DIAGNOSIS — L565 Disseminated superficial actinic porokeratosis (DSAP): Secondary | ICD-10-CM | POA: Diagnosis not present

## 2019-06-17 DIAGNOSIS — L820 Inflamed seborrheic keratosis: Secondary | ICD-10-CM | POA: Diagnosis not present

## 2019-06-17 DIAGNOSIS — L570 Actinic keratosis: Secondary | ICD-10-CM | POA: Diagnosis not present

## 2019-06-22 DIAGNOSIS — I10 Essential (primary) hypertension: Secondary | ICD-10-CM | POA: Diagnosis not present

## 2019-06-22 DIAGNOSIS — F411 Generalized anxiety disorder: Secondary | ICD-10-CM | POA: Diagnosis not present

## 2019-06-22 DIAGNOSIS — R42 Dizziness and giddiness: Secondary | ICD-10-CM | POA: Diagnosis not present

## 2019-08-17 DIAGNOSIS — R05 Cough: Secondary | ICD-10-CM | POA: Diagnosis not present

## 2019-08-28 DIAGNOSIS — Z23 Encounter for immunization: Secondary | ICD-10-CM | POA: Diagnosis not present

## 2019-10-23 ENCOUNTER — Telehealth: Payer: Self-pay | Admitting: Internal Medicine

## 2019-10-23 NOTE — Telephone Encounter (Signed)
I spoke to the patient who would like a call [phone]) from Dr Caryl Comes today regarding his Nadolol medication.  He is presently taking 20 mg, but that does not seem to be helping.  He was wondering if he should increase back up to 40 mg.  Please call him to discuss, as he will be driving from [place] to [place].

## 2019-10-23 NOTE — Telephone Encounter (Signed)
Patient is calling regarding some changes he stated Dr. Caryl Comes wanted to be notified about.

## 2019-10-23 NOTE — Telephone Encounter (Signed)
Follow Up  Pt is calling to provide a different number for contact. The number is [phone].  Please call.

## 2019-10-26 NOTE — Telephone Encounter (Signed)
Patient is following up from Friday 10/23/19 regarding increasing his medication.

## 2019-10-27 NOTE — Telephone Encounter (Signed)
Follow up     Pt is calling to follow up on his message from Friday regarsing his Medication. He says he would like for Dr Caryl Comes to call him back and to please call him at  [phone]  Please call

## 2019-10-27 NOTE — Telephone Encounter (Signed)
Pt states he spoke with Dr Caryl Comes about 3 weeks ago and was advised to decrease his Nadolol from 20mg  to 10mg  because he  was having a heart rate in the 50's.  Pt reports he was not having any PVC's on the 20mg  dose.  Pt now reports with the 10mg  his heart rate stays around 70 and B/P 120/70 but he is having PVC's again and can have 2-6 in a minute if he's checking his pulse.  Does not seem to notice if he is active.  Pt states he is under a great deal of stress and anxiety..  Denies CP, SOB and reports "plenty of energy"  Will route to B. Pamala Duffel, RN and Dr Caryl Comes for further review and to f/u with pt.

## 2019-10-28 NOTE — Telephone Encounter (Signed)
Left message for pt to call back to verify dose of Nadolol pt is currently taking.

## 2019-10-28 NOTE — Telephone Encounter (Signed)
Had some bradycardia so decreased Nadolol 20>>10;  HR improved but PVCs increased>> increased back to 20mg  but without bradycardia.   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

## 2019-11-02 NOTE — Telephone Encounter (Signed)
Spoke with pt who verifies he is currently taking Nadolol 20mg  bid per Dr Olin Pia telephone call on 10/28/2019.  Pt states he was advised by Dr Caryl Comes to call office after he has been back on the 20mg  bid dose of Nadolol x 1 month.  Pt states his last pulse check was 70 but does not recall what day that was.  Pt reports PVC's are starting to lessen.  Pt states he will let us know how he is doing in about a month or sooner if needed.

## 2019-11-09 ENCOUNTER — Telehealth: Payer: Self-pay | Admitting: Internal Medicine

## 2019-11-09 NOTE — Telephone Encounter (Signed)
New Message  Patient is calling in to go over his medication and how it is working for him. States that he was told to call and speak with Dr. Olin Pia nurse. Patient states that he has a few concerns about it. Please give patient a call back to discuss.

## 2019-11-09 NOTE — Telephone Encounter (Signed)
Left message to call office

## 2019-12-22 ENCOUNTER — Other Ambulatory Visit: Payer: Self-pay | Admitting: Internal Medicine

## 2019-12-22 ENCOUNTER — Telehealth: Payer: Self-pay | Admitting: Internal Medicine

## 2019-12-22 NOTE — Telephone Encounter (Signed)
Patient is calling requesting he get a sooner appointment than 03/07/20. He is requesting it be scheduled for the middle of February if possible due to him getting his vaccine on 12/31/19. There was no open appointment before 01/06/20 and he felt that was to soon. Please advise.

## 2019-12-24 DIAGNOSIS — Z8673 Personal history of transient ischemic attack (TIA), and cerebral infarction without residual deficits: Secondary | ICD-10-CM | POA: Diagnosis not present

## 2019-12-24 DIAGNOSIS — G4752 REM sleep behavior disorder: Secondary | ICD-10-CM | POA: Diagnosis not present

## 2019-12-24 DIAGNOSIS — G43109 Migraine with aura, not intractable, without status migrainosus: Secondary | ICD-10-CM | POA: Diagnosis not present

## 2019-12-24 NOTE — Telephone Encounter (Signed)
Phone call to pt who states appointment has been scheduled with Dr Graciela Husbands 02/08/2020.  Pt reports he is doing fine on his half dose of Nadolol other than having occasional palpitations.  Pt states he will sometimes have 3-4 in a minute that he feels.  Pt advised to keep upcoming appointment with Dr Graciela Husbands and continue medications as prescribed.

## 2019-12-29 ENCOUNTER — Ambulatory Visit: Payer: Medicare Other

## 2020-01-07 ENCOUNTER — Ambulatory Visit: Payer: Medicare Other

## 2020-01-15 ENCOUNTER — Ambulatory Visit: Payer: Medicare Other

## 2020-01-20 ENCOUNTER — Ambulatory Visit: Payer: Medicare Other | Admitting: Internal Medicine

## 2020-01-20 DIAGNOSIS — L570 Actinic keratosis: Secondary | ICD-10-CM | POA: Diagnosis not present

## 2020-01-20 DIAGNOSIS — D1801 Hemangioma of skin and subcutaneous tissue: Secondary | ICD-10-CM | POA: Diagnosis not present

## 2020-01-20 DIAGNOSIS — E782 Mixed hyperlipidemia: Secondary | ICD-10-CM | POA: Diagnosis not present

## 2020-01-20 DIAGNOSIS — Z85828 Personal history of other malignant neoplasm of skin: Secondary | ICD-10-CM | POA: Diagnosis not present

## 2020-01-20 DIAGNOSIS — L820 Inflamed seborrheic keratosis: Secondary | ICD-10-CM | POA: Diagnosis not present

## 2020-01-22 DIAGNOSIS — Z0000 Encounter for general adult medical examination without abnormal findings: Secondary | ICD-10-CM | POA: Diagnosis not present

## 2020-01-22 DIAGNOSIS — I10 Essential (primary) hypertension: Secondary | ICD-10-CM | POA: Diagnosis not present

## 2020-01-22 DIAGNOSIS — R001 Bradycardia, unspecified: Secondary | ICD-10-CM | POA: Diagnosis not present

## 2020-01-22 DIAGNOSIS — E785 Hyperlipidemia, unspecified: Secondary | ICD-10-CM | POA: Diagnosis not present

## 2020-02-08 ENCOUNTER — Encounter: Payer: Self-pay | Admitting: Internal Medicine

## 2020-02-08 ENCOUNTER — Other Ambulatory Visit: Payer: Self-pay

## 2020-02-08 ENCOUNTER — Ambulatory Visit (INDEPENDENT_AMBULATORY_CARE_PROVIDER_SITE_OTHER): Payer: Medicare Other | Admitting: Internal Medicine

## 2020-02-08 VITALS — BP 126/76 | HR 58 | Ht 72.0 in | Wt 208.0 lb

## 2020-02-08 DIAGNOSIS — R002 Palpitations: Secondary | ICD-10-CM | POA: Diagnosis not present

## 2020-02-08 DIAGNOSIS — I4510 Unspecified right bundle-branch block: Secondary | ICD-10-CM | POA: Diagnosis not present

## 2020-02-08 NOTE — Progress Notes (Signed)
Patient Care Team: [name], MD as PCP - General (Family Medicine)   HPI  Sean Mendoza is a 73 y.o. 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 His PVCs largely queiscent tolerating his medications well  He is not exercising.  He is aware of his PVCs he says almost only when he checks his pulse.  He was more aware of it a few months ago and increase his nadolol from 20--40.  Heart rates went into the 50s.  Lightheadedness got worse.  Since resuming 20 mg a day he continues to have problems with lightheadedness upon standing.  Denies exercise lightheadedness or shower lightheadedness.   Struggling with anxiety related to Covid.  Continues to see neurology      Past Medical History:  Diagnosis Date  . Anxiety   . Cough    wert on set 09/2010  . HTN (hypertension)   . Migraine   . PVC's (premature ventricular contractions)     Past Surgical History:  Procedure Laterality Date  . CERVICAL DISCECTOMY    . ELBOW SURGERY     . HERNIA REPAIR      Current Outpatient Medications  Medication Sig Dispense Refill  . Ascorbic Acid Buffered (BUFFERED VITAMIN C) 1000 MG CAPS Take 1 tablet by mouth daily.    Marland Kitchen aspirin EC 81 MG tablet Take 81 mg by mouth daily.    Marland Kitchen atorvastatin (LIPITOR) 20 MG tablet Take 1 tablet (20 mg total) by mouth daily. 90 tablet 3  . clonazePAM (KLONOPIN) 1 MG tablet Take 0.5 mg by mouth daily.     . Coenzyme Q10 (COQ-10 PO) Take 1 tablet by mouth daily.    Marland Kitchen diltiazem (CARDIZEM CD) 120 MG 24 hr capsule Take 1 capsule (120 mg total) by mouth daily. 90 capsule 3  . FLUoxetine (PROZAC) 20 MG capsule Take 20 mg by mouth daily.      Marland Kitchen levETIRAcetam (KEPPRA XR) 500 MG 24 hr tablet Take 1 tablet by mouth daily.    . magnesium gluconate (MAGONATE) 500 MG tablet Take 500 mg by mouth daily.    . Melatonin 5 MG TABS Take 1 tablet by mouth at bedtime.    . nadolol (CORGARD) 40 MG tablet TAKE 1/2 TO 1 TABLET BY MOUTH DAILY  FOR PALPITATIONS. Please keep upcoming appt in February with Dr. Graciela Husbands for future refills. Thank you 90 tablet 1  . vitamin B-12 (CYANOCOBALAMIN) 1000 MCG tablet Take 1,000 mcg by mouth daily.     No current facility-administered medications for this visit.    Allergies  Allergen Reactions  . Ampicillin     REACTION: itching  . Sulfonamide Derivatives     unknown    Review of Systems negative except from HPI and PMH  Physical Exam BP 126/76   Pulse (!) 58   Ht 6' (1.829 m)   Wt 208 lb (94.3 kg)   SpO2 98%   BMI 28.21 kg/m  Well developed and nourished in no acute distress HENT normal Neck supple with JVP-  flat   Clear Regular rate and rhythm, no murmurs or gallops Abd-soft with active BS No Clubbing cyanosis edema Skin-warm and dry A & Oriented  Grossly normal sensory and motor function  ECG sinus at 58 Interval 17/13/44 Axis left -37 Right bundle branch block   Assessment and  Plan  PVCs  quiescent  HTN    Hypercholesterolemia     RBBB/LAFB  \Orthostatic lightheadedness  Followed by neurology.  On aspirin and Keppra.  Orthostatic lightheadedness.  Orthostatic vital signs are unrevealing not sure if this is intermittent, but we will be stopping his nadolol (see below) into the degree that this is orthostatic hypotension that is intermittent and not seen today hopefully will improve  He is largely unaware of his PVCs unless he is taking his pulse.  Hence, with his relatively slow heart rate we will discontinue his nadolol.  We will wean him off going 20 q. OD x1 week and then stop.

## 2020-02-08 NOTE — Patient Instructions (Addendum)
Medication Instructions: Your physician has recommended you make the following change in your medication:  Begin taking your Nadolol 20mg   Every other day x 7 days then stop.  *If you need a refill on your cardiac medications before your next appointment, please call your pharmacy*   Lab Work: None ordered.  If you have labs (blood work) drawn today and your tests are completely normal, you will receive your results only by: MyChart Message (if you have MyChart) OR . A paper copy in the mail If you have any lab test that is abnormal or we need to change your treatment, we will call you to review the results.   Testing/Procedures: None ordered.   Follow-Up: At [facility], you and your health needs are our priority.  As part of our continuing mission to provide you with exceptional heart care, we have created designated Provider Care Teams.  These Care Teams include your primary Cardiologist (physician) and Advanced Practice Providers (APPs -  Physician Assistants and Nurse Practitioners) who all work together to provide you with the care you need, when you need it.  We recommend signing up for the patient portal called "MyChart".  Sign up information is provided on this After Visit Summary.  MyChart is used to connect with patients for Virtual Visits (Telemedicine).  Patients are able to view lab/test results, encounter notes, upcoming appointments, etc.  Non-urgent messages can be sent to your provider as well.   To learn more about what you can do with MyChart, go to [facility].    Your next appointment:  6 months   The format for your next appointment:  In person   Provider:  Dr [email or web address]

## 2020-03-07 ENCOUNTER — Ambulatory Visit: Payer: Medicare Other | Admitting: Internal Medicine

## 2020-03-31 DIAGNOSIS — G4752 REM sleep behavior disorder: Secondary | ICD-10-CM | POA: Diagnosis not present

## 2020-03-31 DIAGNOSIS — G43109 Migraine with aura, not intractable, without status migrainosus: Secondary | ICD-10-CM | POA: Diagnosis not present

## 2020-04-01 ENCOUNTER — Other Ambulatory Visit: Payer: Self-pay

## 2020-04-01 ENCOUNTER — Telehealth: Payer: Self-pay

## 2020-04-01 NOTE — Progress Notes (Signed)
Discontinue nadolol per Dr Graciela Husbands.  Nadolol removed from pt's medication list

## 2020-04-01 NOTE — Telephone Encounter (Signed)
LOV note with Dr. Graciela Husbands, pt was suppose to stop medication Nadolol, but this medication is still on pt's medication list. Pt's Dr. Edythe Lynn sent a message to Dr. Graciela Husbands asking him to start pt on Inderal and pt was calling to see if this was done and if so can Dr. Graciela Husbands prescribe this medication for him. Pt would like a call back concerning this matter. Please address

## 2020-04-04 DIAGNOSIS — D699 Hemorrhagic condition, unspecified: Secondary | ICD-10-CM | POA: Diagnosis not present

## 2020-04-04 DIAGNOSIS — F411 Generalized anxiety disorder: Secondary | ICD-10-CM | POA: Diagnosis not present

## 2020-04-05 ENCOUNTER — Encounter: Payer: Self-pay | Admitting: Internal Medicine

## 2020-04-05 MED ORDER — PROPRANOLOL HCL 10 MG PO TABS: 10.0000 mg | ORAL_TABLET | Freq: Two times a day (BID) | ORAL | 0 refills | Status: DC

## 2020-04-05 NOTE — Telephone Encounter (Signed)
Spoke with pt and advised per Dr Graciela Husbands he is agreeable to prescribe Inderal 10mg  - 1 tablet by mouth bid.  #60 with 0/RF.  It will be up to Dr 07-28-1974 to continue prescribing after this initial Rx.  Pt verbalizes understanding and agrees with current plan.

## 2020-04-05 NOTE — Telephone Encounter (Signed)
error 

## 2020-04-05 NOTE — Telephone Encounter (Signed)
Patient calling to follow up on getting a prescription for the Inderal. Patient states he is leaving town tomorrow and needs a call back today.

## 2020-04-06 ENCOUNTER — Telehealth: Payer: Self-pay | Admitting: Internal Medicine

## 2020-04-06 NOTE — Telephone Encounter (Signed)
Victorino Dike from Dr. Dorna Bloom office called to request the patient start inderal 10mg . I informed her the patient already has a prescription for the medication sent to the pharmacy yesterday.

## 2020-04-07 DIAGNOSIS — Z0000 Encounter for general adult medical examination without abnormal findings: Secondary | ICD-10-CM | POA: Diagnosis not present

## 2020-04-07 DIAGNOSIS — R7309 Other abnormal glucose: Secondary | ICD-10-CM | POA: Diagnosis not present

## 2020-04-07 DIAGNOSIS — E785 Hyperlipidemia, unspecified: Secondary | ICD-10-CM | POA: Diagnosis not present

## 2020-04-07 DIAGNOSIS — Z125 Encounter for screening for malignant neoplasm of prostate: Secondary | ICD-10-CM | POA: Diagnosis not present

## 2020-04-25 ENCOUNTER — Other Ambulatory Visit: Payer: Self-pay | Admitting: Internal Medicine

## 2020-04-27 ENCOUNTER — Other Ambulatory Visit: Payer: Self-pay

## 2020-04-27 MED ORDER — PROPRANOLOL HCL 10 MG PO TABS: 10.0000 mg | ORAL_TABLET | Freq: Two times a day (BID) | ORAL | 9 refills | Status: DC

## 2020-05-03 DIAGNOSIS — D692 Other nonthrombocytopenic purpura: Secondary | ICD-10-CM | POA: Diagnosis not present

## 2020-05-03 DIAGNOSIS — L570 Actinic keratosis: Secondary | ICD-10-CM | POA: Diagnosis not present

## 2020-05-03 DIAGNOSIS — L820 Inflamed seborrheic keratosis: Secondary | ICD-10-CM | POA: Diagnosis not present

## 2020-05-03 DIAGNOSIS — Z85828 Personal history of other malignant neoplasm of skin: Secondary | ICD-10-CM | POA: Diagnosis not present

## 2020-05-20 ENCOUNTER — Other Ambulatory Visit: Payer: Self-pay | Admitting: Internal Medicine

## 2020-07-07 DIAGNOSIS — G43109 Migraine with aura, not intractable, without status migrainosus: Secondary | ICD-10-CM | POA: Diagnosis not present

## 2020-07-07 DIAGNOSIS — F419 Anxiety disorder, unspecified: Secondary | ICD-10-CM | POA: Diagnosis not present

## 2020-07-07 DIAGNOSIS — G4752 REM sleep behavior disorder: Secondary | ICD-10-CM | POA: Diagnosis not present

## 2020-08-03 DIAGNOSIS — I10 Essential (primary) hypertension: Secondary | ICD-10-CM | POA: Diagnosis not present

## 2020-08-03 DIAGNOSIS — Z23 Encounter for immunization: Secondary | ICD-10-CM | POA: Diagnosis not present

## 2020-08-03 DIAGNOSIS — E785 Hyperlipidemia, unspecified: Secondary | ICD-10-CM | POA: Diagnosis not present

## 2020-08-03 DIAGNOSIS — F411 Generalized anxiety disorder: Secondary | ICD-10-CM | POA: Diagnosis not present

## 2020-08-22 DIAGNOSIS — M1712 Unilateral primary osteoarthritis, left knee: Secondary | ICD-10-CM | POA: Diagnosis not present

## 2020-08-22 DIAGNOSIS — M170 Bilateral primary osteoarthritis of knee: Secondary | ICD-10-CM | POA: Diagnosis not present

## 2020-08-22 DIAGNOSIS — M1711 Unilateral primary osteoarthritis, right knee: Secondary | ICD-10-CM | POA: Diagnosis not present

## 2020-09-16 DIAGNOSIS — L820 Inflamed seborrheic keratosis: Secondary | ICD-10-CM | POA: Diagnosis not present

## 2020-09-16 DIAGNOSIS — L570 Actinic keratosis: Secondary | ICD-10-CM | POA: Diagnosis not present

## 2020-09-16 DIAGNOSIS — Z85828 Personal history of other malignant neoplasm of skin: Secondary | ICD-10-CM | POA: Diagnosis not present

## 2020-09-22 ENCOUNTER — Ambulatory Visit (INDEPENDENT_AMBULATORY_CARE_PROVIDER_SITE_OTHER): Payer: Medicare Other | Admitting: Psychiatry

## 2020-09-22 ENCOUNTER — Other Ambulatory Visit: Payer: Self-pay

## 2020-09-22 ENCOUNTER — Encounter: Payer: Self-pay | Admitting: Psychiatry

## 2020-09-22 VITALS — BP 114/78 | HR 68 | Ht 72.0 in | Wt 200.0 lb

## 2020-09-22 DIAGNOSIS — F422 Mixed obsessional thoughts and acts: Secondary | ICD-10-CM | POA: Diagnosis not present

## 2020-09-22 DIAGNOSIS — G4752 REM sleep behavior disorder: Secondary | ICD-10-CM | POA: Diagnosis not present

## 2020-09-22 DIAGNOSIS — G250 Essential tremor: Secondary | ICD-10-CM

## 2020-09-22 MED ORDER — PROPRANOLOL HCL 10 MG PO TABS: ORAL_TABLET | ORAL | 2 refills | Status: DC
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
## 2020-09-25 ENCOUNTER — Encounter: Payer: Self-pay | Admitting: Psychiatry

## 2020-10-12 DIAGNOSIS — G43109 Migraine with aura, not intractable, without status migrainosus: Secondary | ICD-10-CM | POA: Diagnosis not present

## 2020-10-12 DIAGNOSIS — Z8673 Personal history of transient ischemic attack (TIA), and cerebral infarction without residual deficits: Secondary | ICD-10-CM | POA: Diagnosis not present

## 2020-10-12 DIAGNOSIS — G4752 REM sleep behavior disorder: Secondary | ICD-10-CM | POA: Diagnosis not present

## 2020-10-20 NOTE — Progress Notes (Signed)
Assessment/Plan:   1.  Idiopathic Parkinson's disease.  The patient has tremor, bradykinesia, rigidity and mild postural instability.  -We discussed the diagnosis as well as pathophysiology of the disease.  We discussed treatment options as well as prognostic indicators.  Patient education was provided.   -We decided to change the timing of his levodopa so that he takes carbidopa/levodopa 25/100, 1 tablet at 10 AM (when he wakes up)/2 PM/6 PM.  I told him that the muscle spasm he experienced the other night was not associated with levodopa.  He was leery.  We discussed r/b/se  -I will refer the patient [facility] for [facility].  We talked about the importance of safe, cardiovascular exercise in Parkinson's disease.  -We discussed community resources in the area including patient support groups and community exercise programs for PD and pt education was provided to the patient.  -Patient and wife asked many questions and I answered those to the best of my ability today.  2.  RBD  -Continue clonazepam 0.5 mg at bedtime.  3.  GAD/depression  -Seeing Dr. Jennelle Human.  Just changed to Lexapro.  4.  Migraine with aura  -Patient on Keppra.  -Patient following with Edythe Lynn, MD   5.  Palpitations  -Patient was originally on nadolol, which was ultimately changed to Inderal to see if that would help with tremor.  It did not.  He asked me what to do with the Inderal now.  I told him he would need to ask cardiology about that as I do not personally need the Inderal for treatment of tremor (also at low dose).  6.  Patient given the option of coming back here or following in full with Edythe Lynn, MD.  He would like to follow with Edythe Lynn, MD for HA and to have his Parkinson's managed here.  That is okay with me.  I will plan on seeing him back in the next 5 months, sooner should new neurologic issues arise.   Subjective:   Sean Mendoza was  seen today in the movement disorders clinic for neurologic consultation at the request of Edythe Lynn, MD.  Wife with patient and supplements hx.   The consultation is for the evaluation of parkinsonism.  Numerous records are reviewed.  Patient under the care of [facility] neurology for dizziness/migraine/REM behavior disorder for many years.  He is on Keppra for migraine prevention and clonazepam 0.5 mg at bedtime for RBD.  He was started on Inderal for tremor(changed from nadolol - used by cardiology for palpitations), which was increased to 20 mg in the AM, 10 mg in the evening by Dr. Jennelle Human in October, 2021.  Patient was last seen by Edythe Lynn, MD on October 12, 2020.  Much of the visit was spent discussing migraine.  However, they also discussed his "longstanding history of a resting tremor in the right hand that is now present near constantly."  He was started on levodopa on the 10/12/20 visit.  Pt reports that he started taking 1 po tid this past Tuesday.  Last night he reports the "night from hell" with muscle spasms in the shoulder blades.  Hx of cervical spine surgery.    Takes carbidopa/levodopa 25/100 at 10am (wakes up then), 4pm, 11pm (bedtime)   Specific Symptoms:  Tremor: Yes.   has been prescribed Inderal/nadolol for tremor but pt didn't think that helpful.  Tremor x 1.5 years.  Tremor in the R hand - started  with eating, drinking but now notes R hand tremor at rest.  No tremor in the R leg.  Wife notes tremor in the lower lip Family hx of similar:   (niece at 38 just this week dx with Parkinsons Disease - fathers side; cousin on fathers side with PD) Voice: intermittently hoarse per wife  Sleep: sleeps well  Vivid Dreams:  some  Acting out dreams:  Yes.   Wet Pillows: No. Postural symptoms:  No.  Falls?  No. Bradykinesia symptoms: no bradykinesia noted Loss of smell:  Potentially.  He doesn't notice it but wife did a smell test with him and he "failed it." Loss of taste:   No. Urinary Incontinence:  No. Difficulty Swallowing:  No. Handwriting, micrographia: its deterioriated but "not tiny" Trouble with ADL's:  No.  Trouble buttoning clothing: No. Depression:  Yes.  And anxiety.  Following with Dr. Jennelle Human.  Dr. Alwyn Ren notes reviewed from October 21. Memory changes:  Yes.  , mild Hallucinations:  No.  visual distortions: No. but trouble with floaters and aura N/V:  No. Lightheaded:  No., not typically.  Remembers one time that he had this and has this associated with migraine  Syncope: No. Diplopia:  One time years ago Dyskinesia:  No. Prior exposure to reglan/antipsychotics: No.  Neuroimaging of the brain has previously been performed.  Patient had MRI brain without gadolinium on October 31, 2015.  Films are not available.  Report states that it is normal.    PREVIOUS MEDICATIONS: Keppra (migraine prevention); Inderal; levodopa; clonazepam (RBD)  ALLERGIES:   Allergies  Allergen Reactions  . Ampicillin     REACTION: itching  . Sulfonamide Derivatives     unknown    CURRENT MEDICATIONS:  Current Outpatient Medications  Medication Instructions  . aspirin EC 81 mg, Oral, Daily  . atorvastatin (LIPITOR) 20 MG tablet TAKE 1 TABLET BY MOUTH EVERY DAY  . clonazePAM (KLONOPIN) 0.5 mg, Oral, Daily  . Coenzyme Q10 (COQ-10 PO) 1 tablet, Oral, Daily  . diltiazem (CARDIZEM CD) 120 MG 24 hr capsule TAKE 1 CAPSULE BY MOUTH EVERY DAY  . escitalopram (LEXAPRO) 10 mg, Oral, Daily  . levETIRAcetam (KEPPRA XR) 500 mg, Oral, Daily  . MAGNESIUM GLUCONATE PO 400 mg, Oral, Daily  . propranolol (INDERAL) 10 MG tablet 1-2 tablets daily every 12 hours for tremor  . vitamin B-12 (CYANOCOBALAMIN) 1,000 mcg, Oral, Daily    Objective:   VITALS:   Vitals:   10/21/20 0929  BP: 127/74  Pulse: 64  SpO2: 96%  Weight: 199 lb (90.3 kg)  Height: 6' (1.829 m)    GEN:  The patient appears stated age and is in NAD. HEENT:  Normocephalic, atraumatic.  The mucous  membranes are moist. The superficial temporal arteries are without ropiness or tenderness. CV:  RRR Lungs:  CTAB Neck/HEME:  There are no carotid bruits bilaterally.  Neurological examination:  Orientation: The patient is alert and oriented x3.  Cranial nerves: There is good facial symmetry. Extraocular muscles are intact. The visual fields are full to confrontational testing. The speech is fluent and clear. Soft palate rises symmetrically and there is no tongue deviation. Hearing is intact to conversational tone. Sensation: Sensation is intact to light and pinprick throughout (facial, trunk, extremities). Vibration is intact at the bilateral big toe. There is no extinction with double simultaneous stimulation. There is no sensory dermatomal level identified. Motor: Strength is 5/5 in the bilateral upper and lower extremities.   Shoulder shrug is equal and symmetric.  There is no pronator drift. Deep tendon reflexes: Deep tendon reflexes are 2/4 at the bilateral biceps, triceps, brachioradialis, patella and achilles. Plantar responses are downgoing bilaterally.  Did not take carbidopa/levodopa today:  Movement examination: Tone: There is mild increased tone in the Carl only with distraction procedures.  Tone elsewhere is nl.   Abnormal movements:  there is RUE rest tremor that slightly increases with distraction Coordination:  There is mild decremation with RAM's, with hand opening and closing and finger taps on the right Gait and Station: The patient has no difficulty arising out of a deep-seated chair without the use of the hands. The patient's stride length is good.  The patient has a neg pull test.     I have reviewed and interpreted the following labs independently   Chemistry      Component Value Date/Time   NA 141 03/21/2017 1557   K 4.1 03/21/2017 1557   CL 99 03/21/2017 1557   CO2 28 03/21/2017 1557   BUN 19 03/21/2017 1557   CREATININE 1.17 03/21/2017 1557      Component Value  Date/Time   CALCIUM 9.5 03/21/2017 1557   ALKPHOS 58 02/09/2011 0845   AST 23 02/09/2011 0845   ALT 31 02/09/2011 0845   BILITOT 1.7 (H) 02/09/2011 0845      No results found for: TSH Lab Results  Component Value Date   WBC 7.4 02/09/2011   HGB 15.5 02/09/2011   HCT 47.3 02/09/2011   MCV 97.1 02/09/2011   PLT 195 02/09/2011     Total time spent on today's visit was 75 minutes, including both face-to-face time and nonface-to-face time.  Time included that spent on review of records (prior notes available to me/labs/imaging if pertinent), discussing treatment and goals, answering patient's questions and coordinating care.  Cc:  [name], MD

## 2020-10-21 ENCOUNTER — Ambulatory Visit: Payer: Medicare Other | Admitting: Neurology

## 2020-10-21 ENCOUNTER — Other Ambulatory Visit: Payer: Self-pay

## 2020-10-21 ENCOUNTER — Encounter: Payer: Self-pay | Admitting: Neurology

## 2020-10-21 ENCOUNTER — Telehealth: Payer: Self-pay | Admitting: Psychiatry

## 2020-10-21 VITALS — BP 127/74 | HR 64 | Ht 72.0 in | Wt 199.0 lb

## 2020-10-21 DIAGNOSIS — G4752 REM sleep behavior disorder: Secondary | ICD-10-CM | POA: Diagnosis not present

## 2020-10-21 DIAGNOSIS — G20 Parkinson's disease: Secondary | ICD-10-CM

## 2020-10-21 DIAGNOSIS — F411 Generalized anxiety disorder: Secondary | ICD-10-CM | POA: Diagnosis not present

## 2020-10-21 DIAGNOSIS — R002 Palpitations: Secondary | ICD-10-CM

## 2020-10-21 NOTE — Telephone Encounter (Signed)
Pt called to report he has been diagnosed with Parkinson's since his visit 10/21 with [facility] and would like to advise Dr. Larose Kells is giving him Dopamine. Pt can't tell any change with meds from apt on 10/21 with [facility].   Call @ [phone]

## 2020-10-21 NOTE — Patient Instructions (Addendum)
1.  Take carbidopa/levodopa 25/100 at 10am/2pm/6pm 2.  You have been referred to [facility] for therapy. They will call you directly to schedule an appointment.  Please call [phone] if you do not hear from them.  3.  Exercise!  . Local  Online Groups, Led by Lynwood Dawley, MSW, LCSW, [facility] o [employer] Parkinson's Group :  Wednesday, December 09, 2019 at 2 pm - [employer] Parkinson's Patient Education Group will be Wednesday December 2nd at 2pm via [place].   - Upcoming [employer] Meetings:  Wednesdays at 2 pm:       12/09/2019, 01/06/2020 o Parkinson's Care [facility] Group:    3rd Mondays, Contact Lynwood Dawley o Atypical Parkinsonian Patient Group:   4th Wednesdays, Contact Lynwood Dawley If you are interested in participating in these online groups with Maralyn Sago, please contact her directly for how to join those meetings.  Her contact information is [email or web address].  She will send you a link to join the [employer].  . Parkinson Foundation:  [email or web address] o PD Health at Home continues:  Mindfulness Mondays, Expert Briefing Tuesdays, Wellness Wednesdays, Take Time Thursdays, Fitness Fridays (New videos for Fitness Fridays!) o Upcoming Webinar:  Motor Symptoms Co-Management:  Occupational Therapy and Neurology.  Tuesday, February 9th at 1 pm  o Please check out their website to sign up for emails and see their full online offerings  . [employer] Foundation:  [email or web address] o Third Thursday Webinar:  Night Fight with Parkinson's:  Acting Out Dreams, Insomnia, and Other Sleep Issues.  Thursday, December 24, 2019 at 12 noon. o Check out additional information under the [employer]  . [employer] Foundation:  [email or web address] o Upcoming webinar:  Speech, Voice, and Parkinson's, with Orlin Hilding.  (From Lonia Blood, PT:  Jonny Ruiz is a personal friend of mine; he is very knowledgeable and you will definitely want to check  this out!) o Check out additional information to Live Well Today on their website  . Parkinson and Movement Disorders (PMD) Alliance:  www.pmdalliance.org o [employer]:  Online Education Events o Stay Connected Social Distancing Series:  Community of Experts discuss how to combat isolation, cope with stress, stay resilient, live well with Parkinson's in this COVID era.  You can register for upcoming online events and view past events from the series. o Sign up for emails, which are sent Mondays and Thursdays to give you updates on programming and online offerings  . Parkinson's Association of [place]:  www.parkinsonassociation.org o Information on online support groups, online exercises, Parkinson's exercises and more     . [employer]: o Financial trader Opportunities:  www.well-springsolutions.org/caregiver-education/caregiver-support-group.  You may also contact Loleta Chance at [email or web address] or [phone].   o [employer] Navigator:  Just1Navigator program, a free service to help individuals and families through the journey of determining care for older adults.  The "Navigator" is a [phone], who will speak with a prospective client and/or loved ones to provide an assessment of the situation and a set of recommendations for a personalized care plan -- all free of charge, and whether [employer] offers the needed service or not. If the need is not a service we provide, we are well-connected with reputable programs in town that we can refer you to.  www.well-springsolutions.org or to speak with the Navigator, call [phone].   . Additional links for movement activities: o Parkinson's Wellness Recovery (PWR! Moves)  www.pwr4life.org - Info on the PWR! Virtual Experience:  You will have  access to our expertise through self-assessment, guided plans that start with the PD-specific fundamentals, educational content, tips, Q&A with an  expert, and a growing Engineering geologist of PD-specific pre-recorded and live exercise classes of varying types and intensity - both physical and cognitive! If that is not enough, we offer 1:1 wellness consultations (in-person or virtual) to personalize your PWR! Dance movement psychotherapist.  - Check out the PWR! Move of the month on the Parkinson Wellness Recovery website:  [email or web address].[email or web address] o [employer]  Fridays:  - As part of the PD Health @ Home program, this free 10-part video series focuses each week on one aspect of fitness designed to support people living with Parkinson's. Created and taught by Parkinson's expert and Doctor of Physical Therapy, Warrick Parisian. -  http://www.morris.com/ - Dance for PD website is offering free, live-stream classes throughout the week:  [email or web address]

## 2020-10-22 NOTE — Telephone Encounter (Signed)
Please review

## 2020-10-24 NOTE — Telephone Encounter (Signed)
Increase Lexapro from 10 to 20 mg 1 month ago.  We need to wait the full 2 months as planned because anxiety and OCD in particular tend to take several weeks to improve.  I got the information about the diagnosis of Parkinson's and his treatment and will keep that in consideration as we make decisions.

## 2020-10-24 NOTE — Telephone Encounter (Signed)
Sean Mendoza message to call back

## 2020-10-25 ENCOUNTER — Telehealth: Payer: Self-pay | Admitting: Psychiatry

## 2020-10-25 NOTE — Telephone Encounter (Signed)
See previous message

## 2020-10-25 NOTE — Telephone Encounter (Signed)
Please clarify your response. Patient states he started Lexaprol 10 mg on 09/22/2020 and there was nothing discussed about increasing to 20 mg. Are you wanting him to increase his dose?

## 2020-10-25 NOTE — Telephone Encounter (Signed)
Have him continue lexapro 10 mg daily for 2 mos total.  If still having problems with anxiety or if there is depression, increase it then to 20 mg daily. thanks

## 2020-10-25 NOTE — Telephone Encounter (Signed)
Rtc to patient and clarified with him to continue Lexapro 10 mg for 2 months, then if not improved increase to 20 mg. He agreed and verbalized understanding. Also discussed his propranolol and increasing dose as needed at times during the day. He is currently taking 20 mg in the am and 10 mg at hs. We adjusted his timing to 20 mg in the am and 10 mg in the evening and if he feels like he needs another 10 mg around 12-1 pm it's okay to add. He agreed and understood. Will call back with further concerns.

## 2020-10-25 NOTE — Telephone Encounter (Signed)
Bufford Lope returned your call. Please call him back at [phone].

## 2020-10-26 ENCOUNTER — Other Ambulatory Visit: Payer: Self-pay | Admitting: Psychiatry

## 2020-10-26 DIAGNOSIS — G250 Essential tremor: Secondary | ICD-10-CM

## 2020-10-26 NOTE — Telephone Encounter (Signed)
Error

## 2020-11-09 ENCOUNTER — Other Ambulatory Visit: Payer: Self-pay | Admitting: Internal Medicine

## 2020-11-11 ENCOUNTER — Ambulatory Visit: Payer: Medicare Other | Attending: Neurology | Admitting: Physical Therapy

## 2020-11-11 ENCOUNTER — Encounter: Payer: Self-pay | Admitting: Physical Therapy

## 2020-11-11 ENCOUNTER — Other Ambulatory Visit: Payer: Self-pay

## 2020-11-11 DIAGNOSIS — R2689 Other abnormalities of gait and mobility: Secondary | ICD-10-CM | POA: Insufficient documentation

## 2020-11-11 NOTE — Therapy (Signed)
[facility] [address] [place], [place], [address] Phone: [phone]   Fax:  [phone]  Physical Therapy Evaluation  Patient Details  Name: Sean Mendoza MRN: [id number] Date of Birth: [birth year] Referring Provider (PT): Kerin Salenat, Rebecca, DO   Encounter Date: 11/11/2020   PT End of Session - 11/11/20 1208    Visit Number 1    Number of Visits 3    Date for PT Re-Evaluation 12/11/20    Authorization Type BCBS Medicare    PT Start Time 1102    PT Stop Time 1147    PT Time Calculation (min) 45 min    Activity Tolerance Patient tolerated treatment well    Behavior During Therapy [facility] for tasks assessed/performed           Past Medical History:  Diagnosis Date  . Amaurosis fugax   . Anxiety   . Cough    wert on set 09/2010  . HTN (hypertension)   . Migraine   . PVC's (premature ventricular contractions)     Past Surgical History:  Procedure Laterality Date  . CERVICAL DISCECTOMY     x 2  . ELBOW SURGERY Left    x 3  . HERNIA REPAIR     inguinal x 2    There were no vitals filed for this visit.    Subjective Assessment - 11/11/20 1105    Subjective Was diagnosed with PD a couple weeks ago by Dr. Arbutus Leasat. Notices tremors in R hand are worse when he gets nervous. Notices his handwriting isn't as good as it was. Has not noticed any changes in his balance or walking. Walks 3 miles a day, also bought a Forensic scientistordic track and it will be coming in soon. Was lifting weights 3x a week at the gym prior to the delta variant.    Pertinent History PD, anxiety, HTN, migraine    Patient Stated Goals would love to keep everything the way it is.    Currently in Pain? No/denies              [facility] PT Assessment - 11/11/20 1114      Assessment   Medical Diagnosis PD    Referring Provider (PT) Tat, Lurena Joinerebecca, DO    Onset Date/Surgical Date 10/21/20    Hand Dominance Right    Prior Therapy previous PT for neck surgeries       Precautions   Precautions None      Balance Screen   Has the patient fallen in the past 6 months No    Has the patient had a decrease in activity level because of a fear of falling?  No    Is the patient reluctant to leave their home because of a fear of falling?  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 [employer]    Leisure golfs a couple times a week, sing and perform on  stage, painting, gardening, world traveler, working out      Observation/Other Assessments   Observations mild resting R hand tremor      Sensation   Light Touch Appears Intact    Additional Comments has had some tingling in the R ankle last week      Coordination   Gross Motor Movements are Fluid and Coordinated Yes      ROM / Strength   AROM / PROM / Strength Strength      Strength   Overall Strength Comments 5/5 B MMT    Strength Assessment Site Hip;Knee;Ankle    Right/Left Hip Right;Left      Transfers   Transfers Sit to Stand;Stand to Sit    Sit to Stand Without upper extremity assist;6: Modified independent (Device/Increase time)    Five time sit to stand comments  8.5 seconds    Stand to Sit 6: Modified independent (Device/Increase time);Without upper extremity assist    Comments 30 second chair stand: 18 sit <> stands      Ambulation/Gait   Ambulation/Gait Yes    Ambulation/Gait Assistance 6: Modified independent (Device/Increase time)    Assistive device None    Gait Pattern Step-through pattern;Decreased arm swing - right     Ambulation Surface Level;Indoor    Gait velocity 7.12 seconds = 4.6 ft/sec      Standardized Balance Assessment   Standardized Balance Assessment Mini-BESTest;Timed Up and Go Test      Mini-BESTest   Sit To Stand Normal: Comes to stand without use of hands and stabilizes independently.    Rise to Toes Normal: Stable for 3 s with maximum height.    Stand on one leg (left) Normal: 20 s.    Stand on one leg (right) Normal: 20 s.    Stand on one leg - lowest score 2    Compensatory Stepping Correction - Forward Normal: Recovers independently with a single, large step (second realignement is allowed).    Compensatory Stepping Correction - Backward Normal: Recovers independently with a single, large step    Compensatory Stepping Correction - Left Lateral Normal: Recovers independently with 1 step (crossover or lateral OK)    Compensatory Stepping Correction - Right Lateral Normal: Recovers independently with 1 step (crossover or lateral OK)    Stepping Corredtion Lateral - lowest score 2    Stance - Feet together, eyes open, firm surface  Normal: 30s    Stance - Feet together, eyes closed, foam surface  Normal: 30s    Incline - Eyes Closed Normal: Stands independently 30s and aligns with gravity    Change in Gait Speed Normal: Significantly changes walkling speed without imbalance    Walk with head turns - Horizontal Normal: performs head turns with no change in gait speed and good balance    Walk with pivot turns Normal: Turns with feet close FAST (< 3 steps) with good balance.    Step over obstacles Normal: Able to step over box with minimal change of gait speed and with good balance.    Timed UP & GO with Dual Task Normal: No noticeable change in sitting, standing or walking while backward counting when compared to TUG without    Mini-BEST total score 28      Timed Up and Go Test   Normal TUG (seconds) 4.91    Cognitive TUG (seconds) 4.88   starting at 77 and counting backwards by 3  Objective measurements completed on examination: See above findings.               PT Education - 11/11/20 1208    Education Details POC, clinical findings, being scheduled for 1-2 visits to provide education about resources and initial PD specific exercise program, answered pt's questions in regards to recent diagnosis and purpose of PT    Person(s) Educated Patient    Methods Explanation    Comprehension Verbalized understanding            PT Short Term Goals - 11/11/20 1210      PT SHORT TERM GOAL #1   Title ALL STGS = LTGS             PT Long Term Goals - 11/11/20 1210      PT LONG TERM GOAL #1   Title Pt will verbalize understanding of local PD specific resources.    Time 2    Period Weeks    Status New    Target Date 11/25/20      PT LONG TERM GOAL #2   Title Pt will be independent with PD specific exercise program.    Time 2    Period Weeks    Status New                  Plan - 11/11/20 1214    Clinical Impression Statement Patient is a 74 year old male referred to Neuro OPPT for PD (newly diagnosed 10/2020). Pt's PMH is significant for: anxiety, HTN, migraine. The following deficits were present during the exam:  decr R arm swing initially during gait, mild R hand resting tremor. Pt is not a fall risk based on outcome measurements. Pt scored a 28/28 on the miniBEST, performed the TUG in 4.91 seconds and cog TUG in 4.88, and gait speed is 4.6 ft/sec.  Pt is currently very active - pt golfs frequently, walks 3 times a week, and has recently purchased a Nordic track system. Pt would benefit from 1-2 skilled PT sessions in order to provide pt with community resources and Parkinson's specific exercise program based on pt's recent diagnosis in conjunction to what pt is already performing at home.    Personal Factors and Comorbidities Comorbidity 3+    Comorbidities PD, anxiety, HTN, migraine    Examination-Activity  Limitations --   N/A   Examination-Participation Restrictions --   N/A   Stability/Clinical Decision Making Stable/Uncomplicated    Clinical Decision Making Low    Rehab Potential Excellent    PT Frequency 1x / week    PT Duration 3 weeks   2-3 weeks   PT Treatment/Interventions Neuromuscular re-education;ADLs/Self Care Home Management;Therapeutic activities    PT Next Visit Plan provide community resources, get pt started on PD specific HEP for PWR moves    Consulted and Agree with Plan of Care Patient           Patient will benefit from skilled therapeutic intervention in order to improve the following deficits and impairments:  Other (comment) (pt would benefit from PT to begin PD specific exercise program due to pt's recent diagnosis)  Visit Diagnosis: Other abnormalities of gait and mobility     Problem List Patient Active Problem List   Diagnosis Date Noted  . Sleep apnea 03/19/2011  . Hypertension 03/19/2011  . Right bundle branch block 03/19/2011  . COUGH 11/02/2010  . DYSLIPIDEMIA 06/15/2010  . Palpitations 06/15/2010    Drake Leach, PT, DPT  11/11/2020,  12:21 PM  [facility] [address] [place], [place], [address] Phone: [phone]   Fax:  [phone]  Name: Sean Mendoza MRN: [id number] Date of Birth: 07-16-1946

## 2020-11-15 ENCOUNTER — Ambulatory Visit: Payer: Medicare Other | Admitting: Physical Therapy

## 2020-11-15 ENCOUNTER — Other Ambulatory Visit: Payer: Self-pay

## 2020-11-15 ENCOUNTER — Encounter: Payer: Self-pay | Admitting: Physical Therapy

## 2020-11-15 DIAGNOSIS — R2689 Other abnormalities of gait and mobility: Secondary | ICD-10-CM

## 2020-11-15 NOTE — Therapy (Signed)
[facility] [address] [place], [place], [address] Phone: [phone]   Fax:  [phone]  Physical Therapy Treatment/Discharge Summary  Patient Details  Name: Sean Mendoza MRN: [id number] Date of Birth: [birth year] Referring Provider (PT): Alonza Bogus, DO   Encounter Date: 11/15/2020   PT End of Session - 11/15/20 1140    Visit Number 2    Number of Visits 3    Date for PT Re-Evaluation 12/11/20    Authorization Type BCBS Medicare    PT Start Time 1102    PT Stop Time 1139    PT Time Calculation (min) 37 min    Activity Tolerance Patient tolerated treatment well    Behavior During Therapy [facility] for tasks assessed/performed           Past Medical History:  Diagnosis Date  . Amaurosis fugax   . Anxiety   . Cough    wert on set 09/2010  . HTN (hypertension)   . Migraine   . PVC's (premature ventricular contractions)     Past Surgical History:  Procedure Laterality Date  . CERVICAL DISCECTOMY     x 2  . ELBOW SURGERY Left    x 3  . HERNIA REPAIR     inguinal x 2    There were no vitals filed for this visit.   Subjective Assessment - 11/15/20 1107    Subjective Reports that he still has been having a tingly sensation in the bottom of his R leg.    Pertinent History PD, anxiety, HTN, migraine    Patient Stated Goals would love to keep everything the way it is.    Currently in Pain? No/denies                             [facility] Adult PT Treatment/Exercise - 11/15/20 1134      Therapeutic Activites    Therapeutic Activities Other Therapeutic Activities    Other Therapeutic Activities discussed community fitness going forwards- pt reports that he walks (when the weather isn't too cold), will be getting a nordic stationary cycle bike soon. Also lifts weights and sometimes does yoga/pilates with his wife and golfs. Educated on continued exercise with PD. Provided pt with handout  for local PD resources (online and exercise options). Scheduling pt for PD screens in 6 months as best practice for follow up, pt verbalized understanding             Pt performs PWR! Moves in standing position:   PWR! Up for improved posture 2 x 10 reps   PWR! Rock for improved weight shifting x10 reps with weight shifting through hips and then looking up and hands, then performing with lifting contralateral leg for dynamic SLS 2 x 10 reps no issues or imbalance noted.   PWR! Twist for improved trunk rotation - 2 x10 reps, initial cueing to stop and reset in the middle   PWR! Step for improved step initiation - 2 x 10 reps B, initial cues for incr RLE foot clearance when stepping back to midline   Discussed intensity and frequency of performing exercises at home 1-2x per day for 5 days a week. Discussed would also be good to perform before golf to loosen up. Pt reporting feeling looser after performing.         PT Education - 11/15/20 1140    Education Details see TA. standing PWR moves.  Person(s) Educated Patient    Methods Explanation;Demonstration;Verbal cues    Comprehension Verbalized understanding;Returned demonstration            PT Short Term Goals - 11/11/20 1210      PT SHORT TERM GOAL #1   Title ALL STGS = LTGS             PT Long Term Goals - 11/15/20 1143      PT LONG TERM GOAL #1   Title Pt will verbalize understanding of local PD specific resources.    Baseline provided on 11/15/20    Time 2    Period Weeks    Status Achieved      PT LONG TERM GOAL #2   Title Pt will be independent with PD specific exercise program.    Baseline began standing PWR moves on 11/15/20    Time 2    Period Weeks    Status Achieved             PHYSICAL THERAPY DISCHARGE SUMMARY  Visits from Start of Care: 2  Current functional level related to goals / functional outcomes: See goals/initial eval.   Remaining deficits: N/A, intermittent decr R arm  swing compared to L    Education / Equipment: HEP, local community resources.   Plan: Patient agrees to discharge.  Patient goals were met. Patient is being discharged due to meeting the stated rehab goals.  ?????          Plan - 11/15/20 1349    Clinical Impression Statement Pt returns for follow-up for 1 visit to establish PD specific HEP and be provided with local PD resources. Established standing PWR moves for pt to perform, with pt needing verbal and demo cues for technique. Pt initially with decr foot clearance with R foot when stepping back to midline during PWR Step, but improved with incr reps and intensity. Pt with no issues, reports his back felt better after performing. Pt also has an aerobic exercise program - walks, and will be getting a nordic track stationary cycling bike in the mail shortly as well as lifts weights at the gym with no issues. Educated on continued importance of exercise with PD. Pt with no additional questions at this time. Will be scheduled back in 6 months for returns PD screens, pt in agreement.    Personal Factors and Comorbidities Comorbidity 3+    Comorbidities PD, anxiety, HTN, migraine    Examination-Activity Limitations --   N/A   Examination-Participation Restrictions --   N/A   Stability/Clinical Decision Making Stable/Uncomplicated    Rehab Potential Excellent    PT Frequency 1x / week    PT Duration 3 weeks   2-3 weeks   PT Treatment/Interventions Neuromuscular re-education;ADLs/Self Care Home Management;Therapeutic activities    PT Next Visit Plan D/C    Consulted and Agree with Plan of Care Patient           Patient will benefit from skilled therapeutic intervention in order to improve the following deficits and impairments:  Other (comment) (pt would benefit from PT to begin PD specific exercise program due to pt's recent diagnosis)  Visit Diagnosis: Other abnormalities of gait and mobility     Problem List Patient Active  Problem List   Diagnosis Date Noted  . Sleep apnea 03/19/2011  . Hypertension 03/19/2011  . Right bundle branch block 03/19/2011  . COUGH 11/02/2010  . DYSLIPIDEMIA 06/15/2010  . Palpitations 06/15/2010    Arliss Journey, PT, DPT  11/15/2020, 2:00 PM  [facility] [address] [place], [place], [address] Phone: [phone]   Fax:  [phone]  Name: Sean Mendoza MRN: [id number] Date of Birth: [birth year]

## 2020-11-17 ENCOUNTER — Encounter: Payer: Self-pay | Admitting: Psychiatry

## 2020-11-17 ENCOUNTER — Other Ambulatory Visit: Payer: Self-pay

## 2020-11-17 ENCOUNTER — Ambulatory Visit (INDEPENDENT_AMBULATORY_CARE_PROVIDER_SITE_OTHER): Payer: Medicare Other | Admitting: Psychiatry

## 2020-11-17 DIAGNOSIS — F331 Major depressive disorder, recurrent, moderate: Secondary | ICD-10-CM | POA: Diagnosis not present

## 2020-11-17 DIAGNOSIS — G4752 REM sleep behavior disorder: Secondary | ICD-10-CM | POA: Diagnosis not present

## 2020-11-17 DIAGNOSIS — F422 Mixed obsessional thoughts and acts: Secondary | ICD-10-CM | POA: Diagnosis not present

## 2020-11-17 MED ORDER — FLUOXETINE HCL 20 MG PO CAPS
ORAL_CAPSULE | ORAL | 1 refills | Status: DC
Start: 2020-11-17 — End: 2020-12-09

## 2020-11-17 NOTE — Patient Instructions (Addendum)
Start fluoxetine 1 daily and 1/2 of Lexapro tablet for 1 week, Then increase fluoxetine to 2 capsules and stop Lexapro

## 2020-11-17 NOTE — Progress Notes (Signed)
[name] [id number] 1946-07-11 74 y.o.  Subjective:   Patient ID:  Sean Mendoza is a 74 y.o. (DOB 09-03-1946) male.  Chief Complaint:  Chief Complaint  Patient presents with  . Follow-up  . Anxiety  . Medication Reaction  . Depression    HPI [name] presents to the office today for follow-up of OCD and uncontrolled REM sleep behavior disorder.  First seen 09/22/2020.  He had just started Lexapro 10 mg daily on 09/22/2020.  He was also taking propranolol as needed for tremor.  11/17/2020 appointment with the following noted: seen with wife, [name] 10/21/2020 Dr. Lurena Joiner Tat diagnosed Parkinson's disease and continued clonazepam 0.5 mg nightly for REM behavior sleep disorder and added Sinemet and then allowed propranolol for tremor and history of palpitations.  On Lexapro for 2 mos without benefit.  More anxious and depressed than when on Lexapro.  Feels tingling sensations in varying places on body and usually on ankle but sometimes on head.  Tingling sensation started 2-3 weeks ago.   Biggest anxiety is over PD and having to take multiple meds.  Still active.  Did PT at [facility] and did well.  More negative and no joy. [name] says she's never seen him this depressed without much initiative and activity.  Stays in bed a lot.  Covid increased anxiety and it hasn't gotten better.   Past Psychiatric Medication Trials:Prozac 20 mg daily for 30 years Lexapro 10 daily    Review of Systems:  Review of Systems  Skin:       tingling  Neurological: Positive for tremors. Negative for weakness.    Medications: I have reviewed the patient's current medications.  Current Outpatient Medications  Medication Sig Dispense Refill  . aspirin EC 81 MG tablet Take 81 mg by mouth daily.    Marland Kitchen atorvastatin (LIPITOR) 20 MG tablet TAKE 1 TABLET BY MOUTH EVERY DAY 90 tablet 3  . carbidopa-levodopa (SINEMET IR) 25-100 MG tablet Take 1 tablet by mouth 3 (three) times daily.    .  clonazePAM (KLONOPIN) 1 MG tablet Take 0.5 mg by mouth daily.     . Coenzyme Q10 (COQ-10 PO) Take 1 tablet by mouth daily.    Marland Kitchen diltiazem (CARDIZEM CD) 120 MG 24 hr capsule TAKE 1 CAPSULE BY MOUTH EVERY DAY 90 capsule 3  . levETIRAcetam (KEPPRA XR) 500 MG 24 hr tablet Take 500 mg by mouth daily.    Marland Kitchen MAGNESIUM GLUCONATE PO Take 400 mg by mouth daily.     . propranolol (INDERAL) 10 MG tablet TAKE 1-2 TABLETS EVERY 12 HOURS FOR TREMOR 360 tablet 0  . vitamin B-12 (CYANOCOBALAMIN) 1000 MCG tablet Take 1,000 mcg by mouth daily.    Marland Kitchen FLUoxetine (PROZAC) 20 MG capsule 1 daily for 1 week, then 2 daily 60 capsule 1   No current facility-administered medications for this visit.    Medication Side Effects: None  Allergies:  Allergies  Allergen Reactions  . Ampicillin     REACTION: itching  . Sulfonamide Derivatives     unknown    Past Medical History:  Diagnosis Date  . Amaurosis fugax   . Anxiety   . Cough    wert on set 09/2010  . HTN (hypertension)   . Migraine   . PVC's (premature ventricular contractions)     Family History  Problem Relation Age of Onset  . Heart disease Father   . Depression Brother   . Other Brother  mental breakdown  . Ehlers-Danlos syndrome Daughter        becets syndrome; POTS  . Other Daughter        drug abuse    Social History   Socioeconomic History  . Marital status: Married    Spouse name: Not on file  . Number of children: Not on file  . Years of education: Not on file  . Highest education level: Not on file  Occupational History  . Not on file  Tobacco Use  . Smoking status: Former Games developer  . Smokeless tobacco: Never Used  . Tobacco comment: quit 30 years ago  Vaping Use  . Vaping Use: Never used  Substance and Sexual Activity  . Alcohol use: Yes    Comment: 2 glasses wine/week  . Drug use: No  . Sexual activity: Not on file  Other Topics Concern  . Not on file  Social History Narrative   Married with 4 children   VP  sales   Former smoker.  Quit in 1979.  Smoked for approx 5 yrs. Smoked up to 2 ppd   ETOH- wine every other day   Travels "constantly"   Social Determinants of Health   Financial Resource Strain: Not on file  Food Insecurity: Not on file  Transportation Needs: Not on file  Physical Activity: Not on file  Stress: Not on file  Social Connections: Not on file  Intimate Partner Violence: Not on file    Past Medical History, Surgical history, Social history, and Family history were reviewed and updated as appropriate.   Please see review of systems for further details on the patient's review from today.   Objective:   Physical Exam:  There were no vitals taken for this visit.  Physical Exam Constitutional:      General: He is not in acute distress. Musculoskeletal:        General: No deformity.  Neurological:     Mental Status: He is alert and oriented to person, place, and time.     Coordination: Coordination normal.  Psychiatric:        Attention and Perception: Attention and perception normal. He does not perceive auditory or visual hallucinations.        Mood and Affect: Mood is anxious and depressed. Affect is not labile, blunt, angry or inappropriate.        Speech: Speech normal.        Behavior: Behavior normal.        Thought Content: Thought content normal. Thought content is not paranoid or delusional. Thought content does not include homicidal or suicidal ideation. Thought content does not include homicidal or suicidal plan.        Cognition and Memory: Cognition and memory normal.        Judgment: Judgment normal.     Comments: Insight intact obsessing     Lab Review:     Component Value Date/Time   NA 141 03/21/2017 1557   K 4.1 03/21/2017 1557   CL 99 03/21/2017 1557   CO2 28 03/21/2017 1557   GLUCOSE 112 (H) 03/21/2017 1557   GLUCOSE 83 02/09/2011 0845   BUN 19 03/21/2017 1557   CREATININE 1.17 03/21/2017 1557   CALCIUM 9.5 03/21/2017 1557   PROT  7.1 02/09/2011 0845   ALBUMIN 3.9 02/09/2011 0845   AST 23 02/09/2011 0845   ALT 31 02/09/2011 0845   ALKPHOS 58 02/09/2011 0845   BILITOT 1.7 (H) 02/09/2011 0845   GFRNONAA 64 03/21/2017 1557  GFRAA 74 03/21/2017 1557       Component Value Date/Time   WBC 7.4 02/09/2011 0845   RBC 4.87 02/09/2011 0845   HGB 15.5 02/09/2011 0845   HCT 47.3 02/09/2011 0845   PLT 195 02/09/2011 0845   MCV 97.1 02/09/2011 0845   MCH 31.8 02/09/2011 0845   MCHC 32.8 02/09/2011 0845   RDW 12.5 02/09/2011 0845   LYMPHSABS 1.9 02/09/2011 0845   MONOABS 1.2 (H) 02/09/2011 0845   EOSABS 0.2 02/09/2011 0845   BASOSABS 0.0 02/09/2011 0845    No results found for: POCLITH, LITHIUM   No results found for: PHENYTOIN, PHENOBARB, VALPROATE, CBMZ   .res Assessment: Plan:    Cayden was seen today for follow-up, anxiety, medication reaction and depression.  Diagnoses and all orders for this visit:  Major depressive disorder, recurrent episode, moderate (HCC) -     FLUoxetine (PROZAC) 20 MG capsule; 1 daily for 1 week, then 2 daily  Mixed obsessional thoughts and acts -     FLUoxetine (PROZAC) 20 MG capsule; 1 daily for 1 week, then 2 daily  Uncontrolled REM sleep behavior disorder  Patient has been switched from fluoxetine 20 to Lexapro 10 a couple of months ago by primary care doctor for depression and OCD.  He is gotten worse instead of better.  His wife agrees with that she has not seen him this depressed in a long time.  He had no significant side effect problems with the fluoxetine.  We discussed the options of increasing Lexapro to 20 mg.  However very often OCD requires high dose of an SSRI and there is cardiovascular risk increasing Lexapro above 20.  We discussed the other option of switching back to Prozac and increasing the dose of that medication.  He chose the latter option.  We discussed the transition and SSRI withdrawal  Start fluoxetine 1 daily and 1/2 of Lexapro tablet for 1  week, Then increase fluoxetine to 2 capsules and stop Lexapro  Supportive therapy and processing around the new diagnosis of Parkinson's disease.  He is doubting the diagnosis and heavily researching on the Internet.  He is also feeling more depressed and hopeless.  We discussed how depression can cause a sense of hopelessness and seeing the glass "half empty" even when it in fact is half full or more.  We discussed cognitive behavior therapy techniques including distraction and limiting time around obsessing and ruminating on Parkinson's disease.  Educated wife about his depression and OCD.  FU 6-8 weeks  Meredith Staggers, MD, DFAPA  Please see After Visit Summary for patient specific instructions.  Future Appointments  Date Time Provider Department Center  01/03/2021  2:00 PM Duke Salvia, MD [facility]  01/09/2021  9:45 AM Cottle, Steva Ready., MD [facility] None  02/20/2021 10:00 AM Cottle, Steva Ready., MD [facility] None  04/13/2021 11:15 AM Tat, Octaviano Batty, DO [facility] None  04/25/2021  1:15 PM Barron Alvine, CCC-SLP [facility]  04/25/2021  1:30 PM Gean Maidens, PT [facility]  04/25/2021  1:45 PM Marlis Edelson, OT [facility]    No orders of the defined types were placed in this encounter.   -------------------------------

## 2020-12-09 ENCOUNTER — Other Ambulatory Visit: Payer: Self-pay | Admitting: Psychiatry

## 2020-12-09 DIAGNOSIS — F422 Mixed obsessional thoughts and acts: Secondary | ICD-10-CM

## 2020-12-09 DIAGNOSIS — F331 Major depressive disorder, recurrent, moderate: Secondary | ICD-10-CM

## 2020-12-15 ENCOUNTER — Other Ambulatory Visit: Payer: Self-pay | Admitting: Psychiatry

## 2020-12-15 DIAGNOSIS — F331 Major depressive disorder, recurrent, moderate: Secondary | ICD-10-CM

## 2020-12-15 DIAGNOSIS — F422 Mixed obsessional thoughts and acts: Secondary | ICD-10-CM

## 2020-12-31 DIAGNOSIS — R42 Dizziness and giddiness: Secondary | ICD-10-CM | POA: Insufficient documentation

## 2021-01-03 ENCOUNTER — Encounter: Payer: Self-pay | Admitting: Internal Medicine

## 2021-01-03 ENCOUNTER — Ambulatory Visit: Payer: Medicare Other | Admitting: Internal Medicine

## 2021-01-03 ENCOUNTER — Other Ambulatory Visit: Payer: Self-pay

## 2021-01-03 VITALS — BP 120/80 | HR 72 | Ht 72.0 in | Wt 196.0 lb

## 2021-01-03 DIAGNOSIS — R42 Dizziness and giddiness: Secondary | ICD-10-CM | POA: Diagnosis not present

## 2021-01-03 DIAGNOSIS — G250 Essential tremor: Secondary | ICD-10-CM | POA: Diagnosis not present

## 2021-01-03 DIAGNOSIS — R002 Palpitations: Secondary | ICD-10-CM | POA: Diagnosis not present

## 2021-01-03 DIAGNOSIS — Z8659 Personal history of other mental and behavioral disorders: Secondary | ICD-10-CM | POA: Diagnosis not present

## 2021-01-03 DIAGNOSIS — G20 Parkinson's disease: Secondary | ICD-10-CM | POA: Diagnosis not present

## 2021-01-03 DIAGNOSIS — Z8679 Personal history of other diseases of the circulatory system: Secondary | ICD-10-CM | POA: Diagnosis not present

## 2021-01-03 DIAGNOSIS — I4510 Unspecified right bundle-branch block: Secondary | ICD-10-CM

## 2021-01-03 NOTE — Progress Notes (Signed)
Patient Care Team: [name], MD as PCP - General (Family Medicine)   HPI  Sean Mendoza is a 75 y.o. 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 His PVCs largely queiscent tolerating his medications well  He is not exercising.  He is aware of his PVCs he says almost only when he checks his pulse.  He was more aware of it a few months ago and increase his nadolol from 20--40.  Heart rates went into the 50s.  Lightheadedness got worse.  Since resuming 20 mg a day he continues to have problems with lightheadedness upon standing.  Denies exercise lightheadedness or shower lightheadedness.   Struggling with anxiety related to Covid. Also has had deaths of some close friends which has been existentially disconcerting  Continues to see neurology-- possible dx of Parkinsons, but has gone to [facility]   with a much better physician experience  PVCs in clusters     Past Medical History:  Diagnosis Date  . Amaurosis fugax   . Anxiety   . Cough    wert on set 09/2010  . HTN (hypertension)   . Migraine   . PVC's (premature ventricular contractions)     Past Surgical History:  Procedure Laterality Date  . CERVICAL DISCECTOMY     x 2  . ELBOW SURGERY Left    x 3  . HERNIA REPAIR     inguinal x 2    Current Outpatient Medications  Medication Sig Dispense Refill  . aspirin EC 81 MG tablet Take 81 mg by mouth daily.    Marland Kitchen atorvastatin (LIPITOR) 20 MG tablet TAKE 1 TABLET BY MOUTH EVERY DAY 90 tablet 3  . carbidopa-levodopa (SINEMET IR) 25-100 MG tablet Take 1 tablet by mouth 3 (three) times daily.    . clonazePAM (KLONOPIN) 1 MG tablet Take 0.5 mg by mouth daily.     . Coenzyme Q10 (COQ-10 PO) Take 1 tablet by mouth daily.    Marland Kitchen diltiazem (CARDIZEM CD) 120 MG 24 hr capsule TAKE 1 CAPSULE BY MOUTH EVERY DAY 90 capsule 3  . FLUoxetine (PROZAC) 20 MG capsule TAKE 1 CAPSULE BY MOUTH EVERY DAY FOR 1 WEEK, THEN 2 CAPSULE DAILY THEREAFTER (STOP  ESCITALOPRAM) 60 capsule 0  . levETIRAcetam (KEPPRA XR) 500 MG 24 hr tablet Take 500 mg by mouth daily.    Marland Kitchen MAGNESIUM GLUCONATE PO Take 400 mg by mouth daily.     . Melatonin 3 MG CAPS Take 1 capsule by mouth at bedtime.    . propranolol (INDERAL) 10 MG tablet TAKE 1-2 TABLETS EVERY 12 HOURS FOR TREMOR 360 tablet 0  . vitamin B-12 (CYANOCOBALAMIN) 1000 MCG tablet Take 1,000 mcg by mouth daily.     No current facility-administered medications for this visit.    Allergies  Allergen Reactions  . Ampicillin     REACTION: itching  . Promethazine Hcl Other (See Comments)  . Sulfonamide Derivatives     unknown    Review of Systems negative except from HPI and PMH  Physical Exam BP 120/80   Pulse 72   Ht 6' (1.829 m)   Wt 196 lb (88.9 kg)   SpO2 97%   BMI 26.58 kg/m  Well developed and nourished in no acute distress HENT normal Neck supple with JVP-  flat  Clear Regular rate and rhythm, no murmurs or gallops Abd-soft with active BS No Clubbing cyanosis edema Skin-warm and dry A & Oriented  Grossly normal sensory and motor  function  ECG sinus @ 72 18/13/41 -48   Assessment and  Plan  PVCs  quiescent  HTN    Hypercholesterolemia     RBBB/LAFB  Orthostatic lightheadedness  parkinsons  GAD/Depression   Migraine--Keppra   ? parkinsons  PVCs are relatively quiet will continue to take diltiazem and use inderal prn for palp  Currently undergoing eval of tremor ? parkinsons  Will hold off on any med decision regarding PVCs or BP until that is completed  Discussed the major stresses and the bleesing of a new grandchild

## 2021-01-03 NOTE — Patient Instructions (Signed)
 Instructions:  Your physician recommends that you continue on your current medications as directed. Please refer to the Current Medication list given to you today.  *If you need a refill on your cardiac medications before your next appointment, please call your pharmacy*   Lab Work: None ordered.  If you have labs (blood work) drawn today and your tests are completely normal, you will receive your results only by: Marland Kitchen MyChart Message (if you have MyChart) OR . A paper copy in the mail If you have any lab test that is abnormal or we need to change your treatment, we will call you to review the results.   Testing/Procedures: None ordered.    Follow-Up: At [facility], you and your health needs are our priority.  As part of our continuing mission to provide you with exceptional heart care, we have created designated Provider Care Teams.  These Care Teams include your primary Cardiologist (physician) and Advanced Practice Providers (APPs -  Physician Assistants and Nurse Practitioners) who all work together to provide you with the care you need, when you need it.  We recommend signing up for the patient portal called "MyChart".  Sign up information is provided on this After Visit Summary.  MyChart is used to connect with patients for Virtual Visits (Telemedicine).  Patients are able to view lab/test results, encounter notes, upcoming appointments, etc.  Non-urgent messages can be sent to your provider as well.   To learn more about what you can do with MyChart, go to [email or web address].    Your next appointment:   12 month(s)  The format for your next appointment:   In Person  Provider:   Sherryl Manges, MD

## 2021-01-04 NOTE — Addendum Note (Signed)
Addended by: Winifred Olive on: 01/04/2021 01:36 PM   Modules accepted: Orders

## 2021-01-09 ENCOUNTER — Encounter: Payer: Self-pay | Admitting: Psychiatry

## 2021-01-09 ENCOUNTER — Telehealth (INDEPENDENT_AMBULATORY_CARE_PROVIDER_SITE_OTHER): Payer: Medicare Other | Admitting: Psychiatry

## 2021-01-09 DIAGNOSIS — F422 Mixed obsessional thoughts and acts: Secondary | ICD-10-CM | POA: Diagnosis not present

## 2021-01-09 DIAGNOSIS — G250 Essential tremor: Secondary | ICD-10-CM | POA: Diagnosis not present

## 2021-01-09 DIAGNOSIS — F331 Major depressive disorder, recurrent, moderate: Secondary | ICD-10-CM | POA: Diagnosis not present

## 2021-01-09 DIAGNOSIS — G4752 REM sleep behavior disorder: Secondary | ICD-10-CM | POA: Diagnosis not present

## 2021-01-09 MED ORDER — CLONAZEPAM 1 MG PO TABS: 1.0000 mg | ORAL_TABLET | Freq: Every day | ORAL | 2 refills | Status: DC

## 2021-01-09 MED ORDER — FLUOXETINE HCL 40 MG PO CAPS: 40.0000 mg | ORAL_CAPSULE | Freq: Every day | ORAL | 0 refills | Status: DC
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
## 2021-01-31 DIAGNOSIS — G20 Parkinson's disease: Secondary | ICD-10-CM | POA: Diagnosis not present

## 2021-02-16 ENCOUNTER — Other Ambulatory Visit: Payer: Self-pay | Admitting: Psychiatry

## 2021-02-16 DIAGNOSIS — F331 Major depressive disorder, recurrent, moderate: Secondary | ICD-10-CM

## 2021-02-16 DIAGNOSIS — F422 Mixed obsessional thoughts and acts: Secondary | ICD-10-CM

## 2021-02-20 ENCOUNTER — Ambulatory Visit: Payer: Medicare Other | Admitting: Psychiatry

## 2021-02-27 DIAGNOSIS — Z0000 Encounter for general adult medical examination without abnormal findings: Secondary | ICD-10-CM | POA: Diagnosis not present

## 2021-02-27 DIAGNOSIS — I10 Essential (primary) hypertension: Secondary | ICD-10-CM | POA: Diagnosis not present

## 2021-02-27 DIAGNOSIS — E785 Hyperlipidemia, unspecified: Secondary | ICD-10-CM | POA: Diagnosis not present

## 2021-02-27 DIAGNOSIS — D7589 Other specified diseases of blood and blood-forming organs: Secondary | ICD-10-CM | POA: Diagnosis not present

## 2021-02-27 DIAGNOSIS — R7309 Other abnormal glucose: Secondary | ICD-10-CM | POA: Diagnosis not present

## 2021-03-02 DIAGNOSIS — M1712 Unilateral primary osteoarthritis, left knee: Secondary | ICD-10-CM | POA: Diagnosis not present

## 2021-03-02 DIAGNOSIS — M170 Bilateral primary osteoarthritis of knee: Secondary | ICD-10-CM | POA: Diagnosis not present

## 2021-03-03 ENCOUNTER — Telehealth: Payer: Self-pay | Admitting: Psychiatry

## 2021-03-03 NOTE — Telephone Encounter (Signed)
RTC   Dr. Larose Kells  REM BSD developing into PD. His anxiety escalated with new DX. On clonazepam and stopped melatonin.  Gabapentin might be used also.

## 2021-03-03 NOTE — Telephone Encounter (Signed)
Lucy left a message she is a neurologist and she would like to talk to you about Azel. Please give her a call at [phone]

## 2021-03-08 DIAGNOSIS — M1712 Unilateral primary osteoarthritis, left knee: Secondary | ICD-10-CM | POA: Diagnosis not present

## 2021-03-13 ENCOUNTER — Telehealth: Payer: Self-pay | Admitting: Psychiatry

## 2021-03-13 NOTE — Telephone Encounter (Signed)
Please review

## 2021-03-13 NOTE — Telephone Encounter (Signed)
Pt called about a possible medication interaction. He is taking Neurontin 300mg  per day from his neurologist. You have added Fluoxetine 40mg . Is this safe to take together?

## 2021-03-13 NOTE — Telephone Encounter (Signed)
Absolutely safe together

## 2021-03-14 NOTE — Telephone Encounter (Signed)
Pt informed

## 2021-03-17 DIAGNOSIS — M1712 Unilateral primary osteoarthritis, left knee: Secondary | ICD-10-CM | POA: Diagnosis not present

## 2021-03-23 ENCOUNTER — Encounter: Payer: Self-pay | Admitting: Psychiatry

## 2021-03-23 ENCOUNTER — Telehealth (INDEPENDENT_AMBULATORY_CARE_PROVIDER_SITE_OTHER): Payer: Medicare Other | Admitting: Psychiatry

## 2021-03-23 DIAGNOSIS — G4752 REM sleep behavior disorder: Secondary | ICD-10-CM

## 2021-03-23 DIAGNOSIS — F422 Mixed obsessional thoughts and acts: Secondary | ICD-10-CM | POA: Diagnosis not present

## 2021-03-23 DIAGNOSIS — F331 Major depressive disorder, recurrent, moderate: Secondary | ICD-10-CM

## 2021-03-23 MED ORDER — CLONAZEPAM 1 MG PO TABS: 1.0000 mg | ORAL_TABLET | Freq: Every day | ORAL | 1 refills | Status: DC

## 2021-03-23 MED ORDER — FLUOXETINE HCL 40 MG PO CAPS: 40.0000 mg | ORAL_CAPSULE | Freq: Every day | ORAL | 1 refills | Status: DC
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
## 2021-04-13 ENCOUNTER — Ambulatory Visit: Payer: Medicare Other | Admitting: Neurology

## 2021-04-25 ENCOUNTER — Telehealth: Payer: Self-pay | Admitting: Occupational Therapy

## 2021-04-25 ENCOUNTER — Telehealth: Payer: Self-pay | Admitting: Physical Therapy

## 2021-04-25 ENCOUNTER — Ambulatory Visit: Payer: Medicare Other | Admitting: Occupational Therapy

## 2021-04-25 ENCOUNTER — Other Ambulatory Visit: Payer: Self-pay

## 2021-04-25 ENCOUNTER — Ambulatory Visit: Payer: Medicare Other

## 2021-04-25 ENCOUNTER — Ambulatory Visit: Payer: Medicare Other | Attending: Neurology | Admitting: Physical Therapy

## 2021-04-25 ENCOUNTER — Telehealth: Payer: Self-pay

## 2021-04-25 DIAGNOSIS — R4701 Aphasia: Secondary | ICD-10-CM

## 2021-04-25 DIAGNOSIS — R29818 Other symptoms and signs involving the nervous system: Secondary | ICD-10-CM | POA: Insufficient documentation

## 2021-04-25 DIAGNOSIS — K117 Disturbances of salivary secretion: Secondary | ICD-10-CM | POA: Diagnosis not present

## 2021-04-25 DIAGNOSIS — R2689 Other abnormalities of gait and mobility: Secondary | ICD-10-CM | POA: Insufficient documentation

## 2021-04-25 DIAGNOSIS — Z79899 Other long term (current) drug therapy: Secondary | ICD-10-CM | POA: Diagnosis not present

## 2021-04-25 DIAGNOSIS — G20 Parkinson's disease: Secondary | ICD-10-CM | POA: Diagnosis not present

## 2021-04-25 NOTE — Telephone Encounter (Signed)
Dr. Rubin Payor- Sean Mendoza was screened today for speech therapy concerns at our multi-disciplinary PD clinic at [facility]. He endorsed increased difficulties with word finding and showed interest in a short course of speech therapy to improve this deficit and teach him some techniques he can perform at home to improve word finding.  If you agree, please order via Epic or fax script to [phone] for ST evaluate and treat.  Please feel free to call with questions. Thank you.  Verdie Mosher, ,MS, CCC-SLP [phone]

## 2021-04-25 NOTE — Therapy (Signed)
[facility] [address] [place], [place], [address] Phone: [phone]   Fax:  [phone]  Patient Details  Name: Sean Mendoza MRN: [id number] Date of Birth: 1946-07-22 Referring Provider:  Vladimir Faster, DO/Pt has transitioned care to Dr. Rubin Payor  Encounter Date: 04/25/2021  Physical Therapy Parkinson's Disease Screen   Timed Up and Go test: 8.28 sec (compared to 4.91 sec)  10 meter walk test: 7.25 sec = 4.52 ft/sec (compared to 4.6 ft/sec)  5 time sit to stand test: 7.84 sec (compared to 8.5 sec)  Reports feeling a little lightheaded at times upon getting up too quickly.  Balance seems to be okay.  Patient would benefit from Physical Therapy evaluation due to pt's seeing Dr. Neva Seat (with Dr. Carl[facility] practice) earlier today and they are recommending PT eval.  He would like to proceed with PT eval per their recommendations.       Jolette Lana W. 04/25/2021, 1:32 PM  Lonia Blood, PT 04/25/21 1:51 PM Phone: [phone] Fax: [phone]    [facility] [address] [place], [place], [address] Phone: [phone]   Fax:  [phone]

## 2021-04-25 NOTE — Therapy (Signed)
[facility] [address] [place], [place], [address] Phone: [phone]   Fax:  [phone]  Patient Details  Name: Sean Mendoza MRN: [id number] Date of Birth: [birth year] Referring Provider:  [name], MD  Encounter Date: 04/25/2021   Occupational Therapy Parkinson's Disease Screen  Hand dominance:  right   Physical Performance Test item #4 (donning/doffing jacket):  12.94 sec  Fastening/unfastening 3 buttons in:  20.13sec  9-hole peg test:    RUE  21.84sec        LUE  32.75 sec  Box & Blocks Test:   RUE  56 blocks        LUE  55 blocks  Change in ability to perform ADLs/IADLs:  Handwriting is not as smooth.    Pt would benefit from occupational therapy evaluation to establish HEP and provide education for decr risk of future complications related to PD     [facility] 04/25/2021, 1:36 PM  [facility] [address] [place], [place], [address] Phone: [phone]   Fax:  [phone]   Willa Frater, OTR/L [facility] [address] [place], [place]  [address] [phone] phone [phone] 04/25/21 1:37 PM

## 2021-04-25 NOTE — Therapy (Signed)
[facility] [address] [place], [place], [address] Phone: [phone]   Fax:  [phone]  Patient Details  Name: Sean Mendoza MRN: [id number] Date of Birth: 1946/07/22 Referring Provider:  Jaquita Folds, MD  Encounter Date: 04/25/2021    Speech Therapy Parkinson's Disease Screen   Decibel Level today: low 70s dB  (WNL=70-72 dB) with sound level meter 30cm away from pt's masked mouth. Pt's conversational volume is WNL.  Pt endorses recent increased difficulty in finding words/anomia in conversation. He is interested in a short course of speech therapy targeting this and to teach him tasks to perform at home to improve word finding.  Pt denies overt s/sx difficulty in swallowing warranting further evaluation.  Pt would benefit from speech-language eval for anomia- please order via Epic or fax prescription for ST eval and treat to [phone].    [facility] ,MS, CCC-SLP  04/25/2021, 1:50 PM  [facility] [address] [place], [place], [address] Phone: [phone]   Fax:  [phone]

## 2021-04-25 NOTE — Telephone Encounter (Signed)
Mr.Wanda Facundo was screened today for physical therapy concerns at our multi-disciplinary PD clinic at [facility]. He reports recent MD order at PT visit and pt is in agreement to request full PT evaluation.   If you agree, please order via Epic or fax script to [phone] for PT evaluate and treat.  Please feel free to call with questions. Thank you.  Lonia Blood, PT 04/25/21 2:52 PM Phone: [phone] Fax: [phone]   (Faxed request 04/25/2021)

## 2021-04-25 NOTE — Telephone Encounter (Signed)
Dr. Rubin Payor,  [name] (DOB 03-17-1946) was screened today for Occupational therapy concerns at our multi-disciplinary PD clinic at [facility].  He reports mild changes with handwriting and desire to establish PD-specific HEP and education of strategies to reduce risk of future complications.    If you agree, please order via Epic or fax script to [phone] for OT evaluate and treat.  Please feel free to call with questions. Thank you.  Willa Frater, OTR/L [facility] [address] [place], [place]  [address] [phone] phone [phone] 04/25/21 2:56 PM

## 2021-04-27 DIAGNOSIS — L570 Actinic keratosis: Secondary | ICD-10-CM | POA: Diagnosis not present

## 2021-04-27 DIAGNOSIS — L720 Epidermal cyst: Secondary | ICD-10-CM | POA: Diagnosis not present

## 2021-04-27 DIAGNOSIS — Z85828 Personal history of other malignant neoplasm of skin: Secondary | ICD-10-CM | POA: Diagnosis not present

## 2021-05-02 ENCOUNTER — Other Ambulatory Visit (HOSPITAL_BASED_OUTPATIENT_CLINIC_OR_DEPARTMENT_OTHER): Payer: Self-pay

## 2021-05-02 ENCOUNTER — Other Ambulatory Visit: Payer: Self-pay

## 2021-05-02 ENCOUNTER — Ambulatory Visit: Payer: Medicare Other | Attending: Internal Medicine

## 2021-05-02 DIAGNOSIS — Z23 Encounter for immunization: Secondary | ICD-10-CM

## 2021-05-02 MED ORDER — COVID-19 MRNA VACC (MODERNA) 100 MCG/0.5ML IM SUSP
INTRAMUSCULAR | 0 refills | Status: DC
  Filled 2021-05-02: qty 0.25, 1d supply, fill #0

## 2021-05-02 NOTE — Progress Notes (Signed)
   Covid-19 Vaccination Clinic  Name:  Sean Mendoza    MRN: [id number] DOB: 1946/10/19  05/02/2021  [name] was observed post Covid-19 immunization for 15 minutes without incident. He was provided with Vaccine Information Sheet and instruction to access the V-Safe system.   [name] was instructed to call 911 with any severe reactions post vaccine: Marland Kitchen Difficulty breathing  . Swelling of face and throat  . A fast heartbeat  . A bad rash all over body  . Dizziness and weakness   Immunizations Administered    Name Date Dose VIS Date Route   Moderna Covid-19 Booster Vaccine 05/02/2021  2:38 PM 0.25 mL 09/21/2020 Intramuscular   Manufacturer: Moderna   Lot: [id number]   NDC: 45809-983-38

## 2021-05-14 ENCOUNTER — Other Ambulatory Visit: Payer: Self-pay | Admitting: Internal Medicine

## 2021-05-15 ENCOUNTER — Other Ambulatory Visit: Payer: Self-pay

## 2021-05-15 DIAGNOSIS — G250 Essential tremor: Secondary | ICD-10-CM

## 2021-05-15 MED ORDER — ATORVASTATIN CALCIUM 20 MG PO TABS: 1.0000 | ORAL_TABLET | Freq: Every day | ORAL | 2 refills | Status: DC

## 2021-05-15 MED ORDER — PROPRANOLOL HCL 10 MG PO TABS: ORAL_TABLET | ORAL | 2 refills | Status: DC

## 2021-05-15 NOTE — Telephone Encounter (Signed)
Yes, please refill as indicated in last OV Dr. Graciela Husbands had with the Sean Mendoza.

## 2021-05-15 NOTE — Telephone Encounter (Signed)
Pt is requesting a refill on propranolol. This medication was refilled by another provider. Would Dr. Graciela Husbands like to refill this medication, pt stated that he is leaving for [place] tomorrow and needs his medication before he goes out of the country. Please address

## 2021-06-02 ENCOUNTER — Ambulatory Visit: Payer: Medicare Other | Admitting: Neurology

## 2021-06-06 ENCOUNTER — Other Ambulatory Visit: Payer: Self-pay

## 2021-06-06 ENCOUNTER — Encounter: Payer: Self-pay | Admitting: Physical Therapy

## 2021-06-06 ENCOUNTER — Ambulatory Visit: Payer: Medicare Other

## 2021-06-06 ENCOUNTER — Encounter: Payer: Self-pay | Admitting: Occupational Therapy

## 2021-06-06 ENCOUNTER — Ambulatory Visit: Payer: Medicare Other | Attending: Family Medicine | Admitting: Physical Therapy

## 2021-06-06 ENCOUNTER — Ambulatory Visit: Payer: Medicare Other | Admitting: Occupational Therapy

## 2021-06-06 DIAGNOSIS — R29818 Other symptoms and signs involving the nervous system: Secondary | ICD-10-CM

## 2021-06-06 DIAGNOSIS — R2689 Other abnormalities of gait and mobility: Secondary | ICD-10-CM | POA: Insufficient documentation

## 2021-06-06 DIAGNOSIS — R278 Other lack of coordination: Secondary | ICD-10-CM

## 2021-06-06 DIAGNOSIS — R471 Dysarthria and anarthria: Secondary | ICD-10-CM | POA: Insufficient documentation

## 2021-06-06 DIAGNOSIS — R4701 Aphasia: Secondary | ICD-10-CM | POA: Diagnosis not present

## 2021-06-06 DIAGNOSIS — R251 Tremor, unspecified: Secondary | ICD-10-CM

## 2021-06-06 DIAGNOSIS — R293 Abnormal posture: Secondary | ICD-10-CM | POA: Diagnosis not present

## 2021-06-06 NOTE — Therapy (Signed)
[facility] [address] [place], [place], [address] Phone: [phone]   Fax:  [phone]  Physical Therapy Evaluation  Patient Details  Name: Sean Mendoza MRN: [id number] Date of Birth: 07/06/1946 Referring Provider (PT): Jaquita Folds   Encounter Date: 06/06/2021   PT End of Session - 06/06/21 1512     Visit Number 1    Number of Visits 5    Date for PT Re-Evaluation 07/07/21    Authorization Type BCBS Medicare    PT Start Time 1320    PT Stop Time 1401    PT Time Calculation (min) 41 min    Activity Tolerance Patient tolerated treatment well    Behavior During Therapy [facility] for tasks assessed/performed             Past Medical History:  Diagnosis Date   Amaurosis fugax    Anxiety    Cough    wert on set 09/2010   HTN (hypertension)    Migraine    PVC's (premature ventricular contractions)     Past Surgical History:  Procedure Laterality Date   CERVICAL DISCECTOMY     x 2   ELBOW SURGERY Left    x 3   HERNIA REPAIR     inguinal x 2    There were no vitals filed for this visit.    Subjective Assessment - 06/06/21 1506     Subjective Feels he is doing well. Has had to slow down exercises lately due to tingling sensation in neck, R shoulder and R arm; feels it may be due to playing too much golf.  He is to see neurologist about this next week.  Reports occasionally he has dizziness/lightheadedness upon standing, but doesn't generally bother him.  Reports he occasionally does his previous PT exercises (standing PWR! Moves), but they have gotten easy.  No falls.    Pertinent History PD, anxiety, HTN, migraine, essential tremor, orthostatic hypotension, OCD-like behavior, cervical discectomy x 2    Patient Stated Goals want to make sure to keep doing all the things I enjoy.    Currently in Pain? No/denies             BP measures in sitting (manual cuff) 140/88, in standing (manual  cuff) 136/82.  No c/o dizziness in standing today.   [facility] PT Assessment - 06/06/21 1325       Assessment   Medical Diagnosis Parkinson's Disease    Referring Provider (PT) Jaquita Folds    Onset Date/Surgical Date 04/25/21   from screen   Hand Dominance Right    Prior Therapy PT last year-11/2020 for several visits.      Precautions   Precautions None      Balance Screen   Has the patient fallen in the past 6 months No    Has the patient had a decrease in activity level because of a fear of falling?  No    Is the patient reluctant to leave their home because of a fear of falling?  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, cardio 30-45 min 3-4x/wk, enjoys swimming, golf, yardwork, walking in neighborhood      Observation/Other Assessments   Focus on Therapeutic Outcomes (FOTO)  NA      Sensation   Light Touch Appears Intact      Coordination   Gross Motor Movements are Fluid and Coordinated Yes      Posture/Postural Control   Posture/Postural Control Postural limitations    Postural Limitations Rounded Shoulders      Tone   Assessment Location Right Lower Extremity;Left Lower Extremity      ROM / Strength   AROM / PROM / Strength Strength;AROM      Strength   Overall Strength Within functional limits for tasks performed    Overall Strength Comments 5/5 Bilat MMT grossly assessed      Transfers   Transfers Sit to Stand;Stand to Sit    Sit to Stand Without upper  extremity assist;6: Modified independent (Device/Increase time)    Five time sit to stand comments  6.93    Stand to Sit 6: Modified independent (Device/Increase time);Without upper extremity assist      Ambulation/Gait   Ambulation/Gait Yes    Ambulation/Gait Assistance 6: Modified independent (Device/Increase time)    Assistive device None    Gait Pattern Step-through pattern;Decreased arm swing - right    Ambulation Surface Level;Indoor    Gait velocity 6.81 sec =4.82 ft/sec      Standardized Balance Assessment   Standardized Balance Assessment Mini-BESTest;Timed Up and Go Test      Mini-BESTest   Sit To Stand Normal: Comes to stand without use of hands and stabilizes independently.    Rise to Toes Normal: Stable for 3 s with maximum height.    Stand on one leg (left) Normal: 20 s.    Stand on one leg (right) Normal: 20 s.    Stand on one leg - lowest score 2    Compensatory Stepping Correction - Forward Normal: Recovers independently with a single, large step (second realignement is allowed).    Compensatory Stepping Correction - Backward Normal: Recovers independently with a single, large step    Compensatory Stepping Correction - Left Lateral Normal: Recovers independently with 1 step (crossover or lateral OK)    Compensatory Stepping Correction - Right Lateral Normal: Recovers independently with 1 step (crossover or lateral OK)    Stepping Corredtion Lateral - lowest score 2    Stance - Feet together, eyes open, firm surface  Normal: 30s    Stance - Feet together, eyes closed, foam surface  Normal: 30s    Incline - Eyes Closed Normal: Stands independently 30s and aligns with gravity    Change in Gait Speed Normal: Significantly changes walkling speed without imbalance    Walk with head turns - Horizontal Normal: performs head turns with no change in gait speed and good balance    Walk with pivot turns Normal: Turns with feet close FAST (< 3 steps) with good balance.    Step  over obstacles Normal: Able to step over box with minimal change of gait speed and with good balance.    Timed UP & GO with Dual Task Normal: No noticeable change in sitting, standing or walking while backward counting when compared to TUG without    Mini-BEST total  score 28      Timed Up and Go Test   Normal TUG (seconds) 7.28    Manual TUG (seconds) 7.38    Cognitive TUG (seconds) 6.85    TUG Comments Scores >10% difference indicate increased difficulty with dual tasking.      RLE Tone   RLE Tone Within Functional Limits      LLE Tone   LLE Tone Within Functional Limits                        Objective measurements completed on examination: See above findings.               PT Education - 06/06/21 1512     Education Details POC, Eval results    Person(s) Educated Patient    Methods Explanation    Comprehension Verbalized understanding              PT Short Term Goals - 11/11/20 1210       PT SHORT TERM GOAL #1   Title ALL STGS = LTGS               PT Long Term Goals - 06/06/21 1518       PT LONG TERM GOAL #1   Title Pt will verbalize understanding of optimal PD fitness program post-discharge.    Time 4    Period Weeks    Status New      PT LONG TERM GOAL #2   Title Pt will be independent with PD specific exercise program.    Time 4    Period Weeks    Status New                    Plan - 06/06/21 1513     Clinical Impression Statement Pt is a 75 year old male who presents to OPPT with history of Parkinson's disease (has had DaT scan to confirm dx).  He was previously seen for PT in December 2021 to establish Parkinson's specific HEP.  He presents today with decreased timing and coordination of gait and abnormal posture.  He would benefit from short course of therapy to review and upgrade Parkinson's specific HEP to make sure he is conitnueing to perform Parkinson's specific exercises as part of his routine.  He is  very active; however, he has recently experienced R neck/shoulder/UE tingling and is to see MD to determine cause of this, as pt has hx of 2 neck surgeries.    Personal Factors and Comorbidities Comorbidity 3+    Comorbidities PD, anxiety, HTN, migraine, essential tremor, orthostatic hypotension, OCD-like behavior, cervical discectomy x 2    Examination-Activity Limitations --   NA   Examination-Participation Restrictions Community Activity   Community fitness   Stability/Clinical Decision Making Stable/Uncomplicated    Clinical Decision Making Low    Rehab Potential Excellent    PT Frequency 1x / week    PT Duration 4 weeks   plus eval; do not anticipate pt will need all these visits   PT Treatment/Interventions Neuromuscular re-education;ADLs/Self Care Home Management;Therapeutic activities;Gait training;Functional mobility training;Therapeutic exercise    PT Next Visit Plan Review standing PWR! Moves exercises and provide opportunities for challenge-compliant surfaces, FLOW; comprehensive exercise program post d/c    Consulted and Agree with Plan of Care Patient             Patient will benefit from skilled therapeutic intervention in order to improve the following deficits and  impairments:  Abnormal gait, Decreased mobility, Postural dysfunction  Visit Diagnosis: Other abnormalities of gait and mobility  Abnormal posture  Other symptoms and signs involving the nervous system     Problem List Patient Active Problem List   Diagnosis Date Noted   Orthostatic lightheadedness 12/31/2020   Sleep apnea 03/19/2011   Hypertension 03/19/2011   Right bundle branch block 03/19/2011   COUGH 11/02/2010   DYSLIPIDEMIA 06/15/2010   Palpitations 06/15/2010    Areesha Dehaven W. 06/06/2021, 3:20 PM Lonia BloodAmy Grainger Mccarley, PT 06/06/21 3:22 PM Phone: [phone] Fax: [phone]  [facility] [address] [place], [place],  [address] Phone: [phone]   Fax:  [phone]  Name: Sean Mendoza MRN: [id number] Date of Birth: 02-May-1946

## 2021-06-06 NOTE — Therapy (Signed)
[facility] [address] [place], [place], [address] Phone: [phone]   Fax:  [phone]  Occupational Therapy Evaluation  Patient Details  Name: Sean Mendoza MRN: [id number] Date of Birth: 1946/06/26 Referring Provider (OT): Dr. Jaquita Folds   Encounter Date: 06/06/2021   OT End of Session - 06/06/21 1452     Visit Number 1    Number of Visits 5    Date for OT Re-Evaluation 07/30/21   start delay due to scheduling   Authorization Type BCBS Medicare    OT Start Time 1234    OT Stop Time 1318    OT Time Calculation (min) 44 min    Activity Tolerance Patient tolerated treatment well    Behavior During Therapy [facility] for tasks assessed/performed             Past Medical History:  Diagnosis Date   Amaurosis fugax    Anxiety    Cough    wert on set 09/2010   HTN (hypertension)    Migraine    PVC's (premature ventricular contractions)     Past Surgical History:  Procedure Laterality Date   CERVICAL DISCECTOMY     x 2   ELBOW SURGERY Left    x 3   HERNIA REPAIR     inguinal x 2    There were no vitals filed for this visit.   Subjective Assessment - 06/06/21 1242     Subjective  I don't want it to take over my life    Pertinent History Parkinson's Disease.  PMH:  anxiety, HTN, migraine, Dyslipidemia, sleep apnea, R bundle branch block, Orthostatic hypotension, hx of 2 cervical surgeries, hx of L elbow surgery for tendonitis.    Limitations Pt reports some tingling R side of head/face primarily when looking down--will see Dr. Lelon Perla 06/14/21 for this    Patient Stated Goals stay active    Currently in Pain? No/denies               [facility] OT Assessment - 06/06/21 1242       Assessment   Medical Diagnosis Parkinson's Disease    Referring Provider (OT) Dr. Jaquita Folds    Onset Date/Surgical Date 04/25/21   PD Screen   Hand Dominance Right    Prior Therapy PT last year       Precautions   Precautions None      Balance Screen   Has the patient fallen in the past 6 months No      Home  Environment   Family/patient expects to be discharged to: Private residence    Lives With Spouse      Prior Function   Level of Independence Independent    Vocation Retired   [employer] for [employer] golf, swim, music/singing, planting garden, Dealer, bicycle, snorkling/scuba driving, day trading, walking      ADL   Eating/Feeding --   independent   Biomedical scientist    Lower Body Bathing Independent    Upper Body Dressing Independent    Lower Body Dressing Modified independent   may need to touch counter with LB dressing   Toilet Transfer Modified independent    Toileting - Clothing Manipulation Modified independent    Toileting -  Hygiene Modified Independent    Tub/Shower Transfer Independent    Transfers/Ambulation Related to ADL's Pt reports mild difficulty getting out of recliner  IADL   Prior Level of Function Light Housekeeping independent    Light Housekeeping --   independent   Prior Level of Function Meal Prep Independent with grilling    Meal Prep --   likes to Engineer, site own vehicle      Mobility   Mobility Status Independent      Written Expression   Dominant Hand Right    Handwriting 100% legible   very minimal micrographia inconsistently     Vision - History   Baseline Vision Wears glasses only for reading    Additional Comments vision changes with migraines only      Cognition   Overall Cognitive Status Within Functional Limits for tasks assessed   pt report that "memory is not as sharp"     Observation/Other Assessments   Other Surveys  Select    Physical Performance Test   Yes    Simulated Eating Time (seconds) 11.62   holds spoon at end   Donning Doffing Jacket Time (seconds) 12.94 sec (at screen 04/25/21)    Donning Doffing Jacket Comments  fastening/unfastening 3 buttons in 21.62sec      Posture/Postural Control   Posture/Postural Control Postural limitations    Postural Limitations Rounded Shoulders      Sensation   Additional Comments reports tingling R side of head/face--will follow up with Dr. Dutch Quint for this      Coordination   9 Hole Peg Test Right;Left    Right 9 Hole Peg Test 31.19    Left 9 Hole Peg Test 28.91    Box and Blocks R-55blocks, L-61blocks    Tremors occasional R hand tremor per pt report      Tone   Assessment Location Right Upper Extremity;Left Upper Extremity      RUE Tone   RUE Tone --   ? very minimal RUE     LUE Tone   LUE Tone Within Functional Limits                             OT Education - 06/06/21 1451     Education Details Typical cognitive changes associated PD; OT eval results/POC    Person(s) Educated Patient    Methods Explanation    Comprehension Verbalized understanding                 OT Long Term Goals - 06/06/21 1511       OT LONG TERM GOAL #1   Title Pt will be independent with PD-specific HEP.--check LTGs 07/30/21 (due to delayed start)    Time 4    Period Weeks    Status New      OT LONG TERM GOAL #2   Title Pt will verbalize understanding of adaptive strategies to incr ease with ADLs/IADLs and decr risk of future complications related to PD.    Time 4    Period Weeks    Status New      OT LONG TERM GOAL #3   Title Pt will verbalize understanding of ways to decr risk of future complications related to PD.    Time 4    Period Weeks    Status New                   Plan - 06/06/21 1453     Clinical Impression Statement Pt is a 75 y.o. male referred to occupational therapy for Parkinson's disease.  Pt  is indpendent and very active with leisure activities and IADLs.  Pt with PMH that includes: anxiety/OCD, HTN, migraine, Dyslipidemia, sleep apnea, R bundle branch block, Orthostatic hypotension, hx of 2 cervical  surgeries, hx of L elbow surgeries.  Pt is very high level and present with mild deficits with posture, hypokinesia, and decr coordination.  Pt would benefit from occupational therapy to address these deficits and provide education on ways to decr risk of future complications, strategies for incr ease with ADLs/IADLs, and to establish PD-specific HEP.    OT Occupational Profile and History Detailed Assessment- Review of Records and additional review of physical, cognitive, psychosocial history related to current functional performance    Occupational performance deficits (Please refer to evaluation for details): ADL's;IADL's;Leisure    Body Structure / Function / Physical Skills ADL;Dexterity;IADL;FMC;Coordination;Decreased knowledge of use of DME;UE functional use;Mobility    Rehab Potential Excellent    Clinical Decision Making Limited treatment options, no task modification necessary    Comorbidities Affecting Occupational Performance: May have comorbidities impacting occupational performance    Modification or Assistance to Complete Evaluation  No modification of tasks or assist necessary to complete eval    OT Frequency 1x / week    OT Duration 4 weeks   +eval (delayed start due to scheduling)   OT Treatment/Interventions Self-care/ADL training;DME and/or AE instruction;Therapeutic activities;Therapeutic exercise;Functional Mobility Training;Neuromuscular education;Patient/family education;Manual Therapy    Plan large amplitude movement strategies for ADLs/IADLs, initiate complex coordination HEP, (following session: PWR! moves in quadruped)    Consulted and Agree with Plan of Care Patient             Patient will benefit from skilled therapeutic intervention in order to improve the following deficits and impairments:   Body Structure / Function / Physical Skills: ADL, Dexterity, IADL, FMC, Coordination, Decreased knowledge of use of DME, UE functional use, Mobility       Visit  Diagnosis: Other symptoms and signs involving the nervous system  Other lack of coordination  Abnormal posture  Tremor    Problem List Patient Active Problem List   Diagnosis Date Noted   Orthostatic lightheadedness 12/31/2020   Sleep apnea 03/19/2011   Hypertension 03/19/2011   Right bundle branch block 03/19/2011   COUGH 11/02/2010   DYSLIPIDEMIA 06/15/2010   Palpitations 06/15/2010    [facility] 06/06/2021, 3:15 PM  [facility] [address] [place], [place], [address] Phone: [phone]   Fax:  [phone]  Name: Sean Mendoza MRN: [id number] Date of Birth: July 12, 1946  Willa Frater, OTR/L [facility] [address] [place], [place]  [address] [phone] phone [phone] 06/06/21 3:15 PM

## 2021-06-07 NOTE — Therapy (Signed)
[facility] [address] [place], [place], [address] Phone: [phone]   Fax:  [phone]  Speech Language Pathology Evaluation  Patient Details  Name: Sean Mendoza MRN: [id number] Date of Birth: 04/16/1946 Referring Provider (SLP): Clarnce Flock MD   Encounter Date: 06/06/2021   End of Session - 06/07/21 0843     Visit Number 1    Number of Visits 1    Date for SLP Re-Evaluation 06/07/21    SLP Start Time 1404    SLP Stop Time  1445    SLP Time Calculation (min) 41 min    Activity Tolerance Patient tolerated treatment well             Past Medical History:  Diagnosis Date   Amaurosis fugax    Anxiety    Cough    wert on set 09/2010   HTN (hypertension)    Migraine    PVC's (premature ventricular contractions)     Past Surgical History:  Procedure Laterality Date   CERVICAL DISCECTOMY     x 2   ELBOW SURGERY Left    x 3   HERNIA REPAIR     inguinal x 2    There were no vitals filed for this visit.   Subjective Assessment - 06/07/21 0830     Subjective "She's going to do a couple follow ups with me." (pt, re: PT)                SLP Evaluation [facility] - 06/07/21 0830       SLP Visit Information   SLP Received On 06/06/21    Referring Provider (SLP) Clarnce Flock. MD    Medical Diagnosis Parkinson's Disease      Subjective   Patient/Family Stated Goal Pt desires to see if there is need for ST at this time      General Information   HPI Sean Mendoza has been recently diagnosed with PD, confirmed with DAT scan. During a screen on 04-25-21, he endorsed some word finding issues and it was thought wise to bring him in for evaluation to assess this, and obtain baselines for the future.    Mobility Status Pt has PT eval today      Prior Functional Status   Cognitive/Linguistic Baseline Within functional limits      Cognition   Overall Cognitive Status Within Functional Limits for tasks  assessed      Auditory Comprehension   Overall Auditory Comprehension Impaired      Verbal Expression   Overall Verbal Expression Impaired    Naming --   pt reports some mild word finding deficits however today in 15-20 minutes of mod complex conversation today pt without notable anomia   Other Verbal Expression Comments With evens presented from [facility] (BNT-2), pt scored 27/30, or WNL based upon two separate studies of odds/evens on BNT-2. Pt was happy with his current skills, given the fact SLP also introduced him to and instructed him how to complete Semantic Feature Analysis (SFA), and Verb-Network Strengthening Technique (VNeST).      Oral Motor/Sensory Function   Overall Oral Motor/Sensory Function Impaired    Labial ROM Within Functional Limits    Labial Symmetry Within Functional Limits    Labial Coordination WFL    Lingual ROM Within Functional Limits    Lingual Symmetry Within Functional Limits    Lingual Strength Within Functional Limits    Lingual Coordination Reduced   (in lt <-> rt  labial margin alternative motion task)     Motor Speech   Overall Motor Speech Appears within functional limits for tasks assessed      Standardized Assessments   Standardized Assessments  [place] Naming Test-2nd edition                             SLP Education - 06/07/21 [phone]     Education Details procedure with SFA and VNeST    Person(s) Educated Patient    Methods Explanation;Handout;Demonstration;Verbal cues    Comprehension Verbalized understanding;Returned demonstration;Verbal cues required                  Plan - 06/07/21 0844     Clinical Impression Statement Pt presents today with WNL word finding skills as tested with [facility] Test -2, WNL cognition based upon informal testing and observation by SLP during evaluation today, WNL speech volume (low 70s dB), and no reported cognitive deficits. Pt should return for a screen when  scheduled following his current therapy course with PT and OT.    Speech Therapy Frequency One time visit    Consulted and Agree with Plan of Care Patient             Patient will benefit from skilled therapeutic intervention in order to improve the following deficits and impairments:   Dysarthria and anarthria - Plan: SLP plan of care cert/re-cert  Aphasia - Plan: SLP plan of care cert/re-cert    Problem List Patient Active Problem List   Diagnosis Date Noted   Orthostatic lightheadedness 12/31/2020   Sleep apnea 03/19/2011   Hypertension 03/19/2011   Right bundle branch block 03/19/2011   COUGH 11/02/2010   DYSLIPIDEMIA 06/15/2010   Palpitations 06/15/2010    Amere Bricco ,MS, CCC-SLP  06/07/2021, 8:49 AM  [facility] [address] [place], [place], [address] Phone: [phone]   Fax:  [phone]  Name: Sean Mendoza MRN: [id number] Date of Birth: 04-Oct-1946

## 2021-06-07 NOTE — Patient Instructions (Signed)
  VNeST and SFA handouts were provided to pt

## 2021-06-14 DIAGNOSIS — I10 Essential (primary) hypertension: Secondary | ICD-10-CM | POA: Diagnosis not present

## 2021-06-14 DIAGNOSIS — M5412 Radiculopathy, cervical region: Secondary | ICD-10-CM | POA: Diagnosis not present

## 2021-06-29 ENCOUNTER — Other Ambulatory Visit: Payer: Self-pay

## 2021-06-29 ENCOUNTER — Ambulatory Visit: Payer: Medicare Other | Admitting: Physical Therapy

## 2021-06-29 ENCOUNTER — Ambulatory Visit: Payer: Medicare Other | Admitting: Occupational Therapy

## 2021-06-29 DIAGNOSIS — R29818 Other symptoms and signs involving the nervous system: Secondary | ICD-10-CM

## 2021-06-29 DIAGNOSIS — R278 Other lack of coordination: Secondary | ICD-10-CM | POA: Diagnosis not present

## 2021-06-29 DIAGNOSIS — R2689 Other abnormalities of gait and mobility: Secondary | ICD-10-CM | POA: Diagnosis not present

## 2021-06-29 DIAGNOSIS — R4701 Aphasia: Secondary | ICD-10-CM | POA: Diagnosis not present

## 2021-06-29 DIAGNOSIS — R293 Abnormal posture: Secondary | ICD-10-CM | POA: Diagnosis not present

## 2021-06-29 DIAGNOSIS — R471 Dysarthria and anarthria: Secondary | ICD-10-CM | POA: Diagnosis not present

## 2021-06-29 DIAGNOSIS — R251 Tremor, unspecified: Secondary | ICD-10-CM | POA: Diagnosis not present

## 2021-06-29 NOTE — Patient Instructions (Addendum)
Coordination Exercises  Perform the following exercises for 15 minutes 1-2 times per day. Perform with both hand(s). Perform using big movements.  Deal cards: Hold 1/2 or whole deck in your hand. Use thumb to push card off top of deck with one big push. Flip card or pen between each finger. Rotate ball with fingertips: Pick up with fingers/thumb and move as much as you can with each turn/movement (clockwise and counter-clockwise). Juggle 2 balls: Do not go fast. Pause after each toss. Rotate 2 golf balls around in hand x 5 reps clockwise Rt hand, counterclockwise Lt hand Pick up 5 coins one at a time and hold in palm. Then, move coins from palm to fingertips one at a time to stack. Practice writing: Slow down, write big, and focus on forming each letter. Practice typing, playing piano Learning a new song for singing and playing piano   Performing Daily Activities with Big Movements  Pick at least one activity a day and perform with BIG, DELIBERATE movements/effort. This can make the activity easier and turn daily activities into exercise!  If you are standing during the activity, make sure to keep feet apart and stand with good/big/PWR! UP posture.  Examples: Dressing - Push arms in sleeves, twist when putting on jacket, push foot into pants, open hands to pull down shirt/put on socks/pull up pants Bathing - Wash/dry with long strokes Brushing your teeth - Big, slow movements Cutting food - Long deliberate cuts Opening jar/bottle - Move as much as you can with each turn Picking up a cup/bottle - Open hand up big and get object all the way in palm Hanging up clothes/getting clothes down from closet - Reach with big effort Putting away groceries/dishes - Reach with big effort Wiping counter/table - Move in big, long strokes Stirring while cooking - Exaggerate movement Sweeping - Move arms in big, long strokes Vacuuming - Push with big movement Folding clothes - Exaggerate arm  movements Washing car - Move in big, long strokes Raking - Move arms in big, long strokes Changing light bulb - Move as much as you can with each turn Using a screwdriver - Move as much as you can with each turn Walking into a store/restaurant - Walk with big steps, swing arms if able Standing up from a chair/recliner/sofa - Scoot forward, lean forward, and stand with big effort

## 2021-06-29 NOTE — Therapy (Signed)
[facility] [address] [place], [place], [address] Phone: [phone]   Fax:  [phone]  Physical Therapy Treatment  Patient Details  Name: Sean Mendoza MRN: [id number] Date of Birth: January 27, 1946 Referring Provider (PT): Jaquita Folds   Encounter Date: 06/29/2021   PT End of Session - 06/29/21 1933     Visit Number 2    Number of Visits 5    Date for PT Re-Evaluation 07/07/21    Authorization Type BCBS Medicare    PT Start Time 1406    PT Stop Time 1445    PT Time Calculation (min) 39 min    Activity Tolerance Patient tolerated treatment well    Behavior During Therapy [facility] for tasks assessed/performed             Past Medical History:  Diagnosis Date   Amaurosis fugax    Anxiety    Cough    wert on set 09/2010   HTN (hypertension)    Migraine    PVC's (premature ventricular contractions)     Past Surgical History:  Procedure Laterality Date   CERVICAL DISCECTOMY     x 2   ELBOW SURGERY Left    x 3   HERNIA REPAIR     inguinal x 2    There were no vitals filed for this visit.   Subjective Assessment - 06/29/21 1407     Subjective Surgeon said everything looked good as far as the spine; they took me off the neurotin (which they thought might be causing some of the tingling sensation around R ear).  No falls.    Pertinent History PD, anxiety, HTN, migraine, essential tremor, orthostatic hypotension, OCD-like behavior, cervical discectomy x 2    Patient Stated Goals want to make sure to keep doing all the things I enjoy.    Currently in Pain? No/denies                Pt performs PWR! Moves in standing position x 20 reps   PWR! Up for improved posture-cues for BIG open with hands and scapular retraction  PWR! Rock for improved weighshifting-cues for tall posture through stance leg and looking at hand  PWR! Twist for improved trunk rotation-pt performs well  PWR! Step for  improved step initiation -pt reports not having done this one.  Cues for step height and for brief stop in midline for balance.   Pt performs standing PWR! Moves FLOW, x 5 reps with visual, verbal cues from therapist, to address balance and transitions between movement patterns  Standing on [employer], Pt performs standing PWR! Up x 10, PWR! Rock x 10 (cues for control with weightshifting), PWR! Step x 10 reps, cues for foot clearance/step height.  Modified quadruped PWR! Twist at counter x 10 reps each side, cues for technique and weigthshift  Single limb stance activities at counter no support with EO, then intermittent UE support with EC.  Cues for use of visual target to assist with stability in SLS. At 6" aerobic step:  Forward step up/up, down/down x 15 reps, single limb step up x 10 reps, side step up x 10 reps, then side step up and over, x 10 reps, with good foot clearance, good balance.                        PT Education - 06/29/21 1932     Education Details Reviewed PWR! Moves in standing (from previous bout  of therapy); added PWR! Moves FLOW to HEP    Person(s) Educated Patient    Methods Explanation;Demonstration;Handout    Comprehension Verbalized understanding;Returned demonstration                 PT Long Term Goals - 06/06/21 1518       PT LONG TERM GOAL #1   Title Pt will verbalize understanding of optimal PD fitness program post-discharge.    Time 4    Period Weeks    Status New      PT LONG TERM GOAL #2   Title Pt will be independent with PD specific exercise program.    Time 4    Period Weeks    Status New                   Plan - 06/29/21 1934     Clinical Impression Statement Pt returns today for first visit since PT eval; he has been seen by surgion regarding his neck/ear tingling and cervical spine was cleared per pt report.  Initiated HEP today for standing PWR! Moves and PWR! FLOW in standing.  Pt does require cues  for slowed, deliberate motions, especially with return to midline with PWR! Step activity.  He does have some difficulty with dynamic SLS activities, and cues provided for activation through stance hip for improved stability.  He will continue to benefit from skilled PT to establish appropriate HEP and ongoing fitness routine.    Personal Factors and Comorbidities Comorbidity 3+    Comorbidities PD, anxiety, HTN, migraine, essential tremor, orthostatic hypotension, OCD-like behavior, cervical discectomy x 2    Examination-Activity Limitations --   NA   Examination-Participation Restrictions Community Activity   Community fitness   Stability/Clinical Decision Making Stable/Uncomplicated    Rehab Potential Excellent    PT Frequency 1x / week    PT Duration 4 weeks   plus eval; do not anticipate pt will need all these visits   PT Treatment/Interventions Neuromuscular re-education;ADLs/Self Care Home Management;Therapeutic activities;Gait training;Functional mobility training;Therapeutic exercise    PT Next Visit Plan Review updates to HEP; discuss ongoing fitness, likely d/c next visit (will need recert/discharge, due to pt rescheduling-being out of town).    Consulted and Agree with Plan of Care Patient             Patient will benefit from skilled therapeutic intervention in order to improve the following deficits and impairments:  Abnormal gait, Decreased mobility, Postural dysfunction  Visit Diagnosis: Other abnormalities of gait and mobility  Other symptoms and signs involving the nervous system     Problem List Patient Active Problem List   Diagnosis Date Noted   Orthostatic lightheadedness 12/31/2020   Sleep apnea 03/19/2011   Hypertension 03/19/2011   Right bundle branch block 03/19/2011   COUGH 11/02/2010   DYSLIPIDEMIA 06/15/2010   Palpitations 06/15/2010    Brayley Mackowiak W. 06/29/2021, 7:38 PM Gean Maidens., PT  [facility] [address] [place], [place], [address] Phone: [phone]   Fax:  [phone]  Name: ALLAN MINOTTI MRN: [id number] Date of Birth: [birth year]

## 2021-06-29 NOTE — Patient Instructions (Addendum)
  Perform PWR! Moves 20 reps each, daily Perform PWR! Moves FLOW, 5-10 reps, 2-3 times per week

## 2021-06-29 NOTE — Therapy (Signed)
[facility] [address] [place], [place], [address] Phone: [phone]   Fax:  [phone]  Occupational Therapy Treatment  Patient Details  Name: Sean Mendoza MRN: [id number] Date of Birth: 08-06-1946 Referring Provider (OT): Dr. Jaquita Folds   Encounter Date: 06/29/2021   OT End of Session - 06/29/21 1434     Visit Number 2    Number of Visits 5    Date for OT Re-Evaluation 07/30/21   start delay due to scheduling   Authorization Type BCBS Medicare    OT Start Time 1318    OT Stop Time 1403    OT Time Calculation (min) 45 min    Activity Tolerance Patient tolerated treatment well    Behavior During Therapy [facility] for tasks assessed/performed             Past Medical History:  Diagnosis Date   Amaurosis fugax    Anxiety    Cough    wert on set 09/2010   HTN (hypertension)    Migraine    PVC's (premature ventricular contractions)     Past Surgical History:  Procedure Laterality Date   CERVICAL DISCECTOMY     x 2   ELBOW SURGERY Left    x 3   HERNIA REPAIR     inguinal x 2    There were no vitals filed for this visit.   Subjective Assessment - 06/29/21 1327     Subjective  The neurologist took me off the gabapentin    Pertinent History Parkinson's Disease.  PMH:  anxiety, HTN, migraine, Dyslipidemia, sleep apnea, R bundle branch block, Orthostatic hypotension, hx of 2 cervical surgeries, hx of L elbow surgery for tendonitis.    Limitations Pt reports some tingling R side of head/face primarily when looking down--will see Dr. Lelon Perla 06/14/21 for this    Patient Stated Goals stay active    Currently in Pain? No/denies              See below and pt instructions for details on coordination HEP and pt education for ADLS.  Also discussed need for short stent of O.T. at this time as pt was questioning purpose of O.T. due to high functioning  status                    OT Education - 06/29/21 1351     Education Details high level coordination HEP, ADL strategies with large amplitude movements    Person(s) Educated Patient    Methods Explanation;Demonstration;Verbal cues;Handout    Comprehension Verbalized understanding;Returned demonstration;Verbal cues required                 OT Long Term Goals - 06/29/21 1435       OT LONG TERM GOAL #1   Title Pt will be independent with PD-specific HEP.--check LTGs 07/30/21 (due to delayed start)    Time 4    Period Weeks    Status On-going      OT LONG TERM GOAL #2   Title Pt will verbalize understanding of adaptive strategies to incr ease with ADLs/IADLs and decr risk of future complications related to PD.    Time 4    Period Weeks    Status On-going      OT LONG TERM GOAL #3   Title Pt will verbalize understanding of ways to decr risk of future complications related to PD.    Time 4    Period Weeks  Status New                   Plan - 06/29/21 1435     Clinical Impression Statement Pt required more detailed explanation of need for O.T. since pt is high functioning. However pt does demo decreased coordination Rt dominant hand greater than Lt hand. Also explained reasoning for staying ahead of deficits for prevention and/or at least delaying progression of disease process.    OT Occupational Profile and History Detailed Assessment- Review of Records and additional review of physical, cognitive, psychosocial history related to current functional performance    Occupational performance deficits (Please refer to evaluation for details): ADL's;IADL's;Leisure    Body Structure / Function / Physical Skills ADL;Dexterity;IADL;FMC;Coordination;Decreased knowledge of use of DME;UE functional use;Mobility    Rehab Potential Excellent    Clinical Decision Making Limited treatment options, no task modification necessary    Comorbidities Affecting  Occupational Performance: May have comorbidities impacting occupational performance    Modification or Assistance to Complete Evaluation  No modification of tasks or assist necessary to complete eval    OT Frequency 1x / week    OT Duration 4 weeks   +eval (delayed start due to scheduling)   OT Treatment/Interventions Self-care/ADL training;DME and/or AE instruction;Therapeutic activities;Therapeutic exercise;Functional Mobility Training;Neuromuscular education;Patient/family education;Manual Therapy    Plan review coordination HEP prn, reinforce large amplitude movements with ADLS, PWR! moves in quadraped    Consulted and Agree with Plan of Care Patient             Patient will benefit from skilled therapeutic intervention in order to improve the following deficits and impairments:   Body Structure / Function / Physical Skills: ADL, Dexterity, IADL, FMC, Coordination, Decreased knowledge of use of DME, UE functional use, Mobility       Visit Diagnosis: Other symptoms and signs involving the nervous system  Other lack of coordination    Problem List Patient Active Problem List   Diagnosis Date Noted   Orthostatic lightheadedness 12/31/2020   Sleep apnea 03/19/2011   Hypertension 03/19/2011   Right bundle branch block 03/19/2011   COUGH 11/02/2010   DYSLIPIDEMIA 06/15/2010   Palpitations 06/15/2010    Kelli Churn, OTR/L 06/29/2021, 2:39 PM  [facility] [address] [place], [place], [address] Phone: [phone]   Fax:  [phone]  Name: ADMIR CANDELAS MRN: [id number] Date of Birth: January 08, 1946

## 2021-07-10 ENCOUNTER — Ambulatory Visit: Payer: Medicare Other | Admitting: Physical Therapy

## 2021-07-10 ENCOUNTER — Encounter: Payer: Medicare Other | Admitting: Occupational Therapy

## 2021-07-17 ENCOUNTER — Encounter: Payer: Medicare Other | Admitting: Occupational Therapy

## 2021-07-20 ENCOUNTER — Encounter: Payer: Medicare Other | Admitting: Occupational Therapy

## 2021-07-20 ENCOUNTER — Ambulatory Visit: Payer: Medicare Other | Admitting: Physical Therapy

## 2021-07-25 ENCOUNTER — Ambulatory Visit: Payer: Medicare Other | Admitting: Occupational Therapy

## 2021-07-25 ENCOUNTER — Ambulatory Visit: Payer: Medicare Other | Admitting: Physical Therapy

## 2021-07-25 ENCOUNTER — Encounter: Payer: Medicare Other | Admitting: Occupational Therapy

## 2021-07-27 ENCOUNTER — Encounter: Payer: Medicare Other | Admitting: Occupational Therapy

## 2021-08-03 ENCOUNTER — Encounter: Payer: Medicare Other | Admitting: Occupational Therapy

## 2021-08-14 ENCOUNTER — Other Ambulatory Visit: Payer: Self-pay

## 2021-08-14 ENCOUNTER — Ambulatory Visit: Payer: Medicare Other | Admitting: Occupational Therapy

## 2021-08-14 ENCOUNTER — Ambulatory Visit: Payer: Medicare Other | Attending: Family Medicine | Admitting: Physical Therapy

## 2021-08-14 ENCOUNTER — Encounter: Payer: Self-pay | Admitting: Physical Therapy

## 2021-08-14 DIAGNOSIS — R29818 Other symptoms and signs involving the nervous system: Secondary | ICD-10-CM | POA: Insufficient documentation

## 2021-08-14 DIAGNOSIS — R278 Other lack of coordination: Secondary | ICD-10-CM | POA: Diagnosis not present

## 2021-08-14 DIAGNOSIS — R2689 Other abnormalities of gait and mobility: Secondary | ICD-10-CM | POA: Insufficient documentation

## 2021-08-14 NOTE — Therapy (Signed)
[place] [address] [place], [place], [address] Phone: [phone]   Fax:  [phone]  Occupational Therapy Treatment  Patient Details  Name: Sean Mendoza MRN: [id number] Date of Birth: January 22, 1946 Referring Provider (OT): Dr. Eldridge Abrahams   Encounter Date: 08/14/2021   OT End of Session - 08/14/21 1026     Visit Number 3    Number of Visits 5    Date for OT Re-Evaluation 07/30/21   start delay due to scheduling   Authorization Type BCBS Medicare    OT Start Time 0945    OT Stop Time 1023    OT Time Calculation (min) 38 min    Activity Tolerance Patient tolerated treatment well    Behavior During Therapy [facility] for tasks assessed/performed             Past Medical History:  Diagnosis Date   Amaurosis fugax    Anxiety    Cough    wert on set 09/2010   HTN (hypertension)    Migraine    PVC's (premature ventricular contractions)     Past Surgical History:  Procedure Laterality Date   CERVICAL DISCECTOMY     x 2   ELBOW SURGERY Left    x 3   HERNIA REPAIR     inguinal x 2    There were no vitals filed for this visit.   Subjective Assessment - 08/14/21 0953     Subjective  I tried water skiing and pulled a hamstring.    Pertinent History Parkinson's Disease.  PMH:  anxiety, HTN, migraine, Dyslipidemia, sleep apnea, R bundle branch block, Orthostatic hypotension, hx of 2 cervical surgeries, hx of L elbow surgery for tendonitis.    Limitations Pt reports some tingling R side of head/face primarily when looking down--will see Dr. Deri Fuelling 06/14/21 for this    Patient Stated Goals stay active    Currently in Pain? No/denies                          OT Treatments/Exercises (OP) - 08/14/21 0001       ADLs   ADL Comments Discussed ways to prevent future related PD complications. Pt also issued community and online resources including new drumming class. Discussed ex's to  avoid and promote at gym and reminded pt to avoid full meal/protein around medicine times.      Exercises   Exercises --   Reviewed coordination HEP w/ emphasis on large movements                   OT Education - 08/14/21 1003     Education Details review high level coordination HEP, ways to reduce future PD related complications, online and community resources    Person(s) Educated Patient    Methods Explanation;Handout    Comprehension Verbalized understanding                 OT Long Term Goals - 08/14/21 1027       OT LONG TERM GOAL #1   Title Pt will be independent with PD-specific HEP.--check LTGs 07/30/21 (due to delayed start)    Time 4    Period Weeks    Status On-going      OT LONG TERM GOAL #2   Title Pt will verbalize understanding of adaptive strategies to incr ease with ADLs/IADLs and decr risk of future complications related to PD.    Time 4  Period Weeks    Status Achieved      OT LONG TERM GOAL #3   Title Pt will verbalize understanding of ways to decr risk of future complications related to PD.    Time 4    Period Weeks    Status Achieved                   Plan - 08/14/21 1028     Clinical Impression Statement Pt has met 2/3 LTG's at this time. Pt staying active and high functioning    OT Occupational Profile and History Detailed Assessment- Review of Records and additional review of physical, cognitive, psychosocial history related to current functional performance    Occupational performance deficits (Please refer to evaluation for details): ADL's;IADL's;Leisure    Body Structure / Function / Physical Skills ADL;Dexterity;IADL;FMC;Coordination;Decreased knowledge of use of DME;UE functional use;Mobility    Rehab Potential Excellent    Clinical Decision Making Limited treatment options, no task modification necessary    Comorbidities Affecting Occupational Performance: May have comorbidities impacting occupational performance     Modification or Assistance to Complete Evaluation  No modification of tasks or assist necessary to complete eval    OT Frequency 1x / week    OT Duration 4 weeks   +eval (delayed start due to scheduling)   OT Treatment/Interventions Self-care/ADL training;DME and/or AE instruction;Therapeutic activities;Therapeutic exercise;Functional Mobility Training;Neuromuscular education;Patient/family education;Manual Therapy    Plan PWR! moves in quadraped then d/c next session    Consulted and Agree with Plan of Care Patient             Patient will benefit from skilled therapeutic intervention in order to improve the following deficits and impairments:   Body Structure / Function / Physical Skills: ADL, Dexterity, IADL, FMC, Coordination, Decreased knowledge of use of DME, UE functional use, Mobility       Visit Diagnosis: Other symptoms and signs involving the nervous system  Other lack of coordination    Problem List Patient Active Problem List   Diagnosis Date Noted   Orthostatic lightheadedness 12/31/2020   Sleep apnea 03/19/2011   Hypertension 03/19/2011   Right bundle branch block 03/19/2011   COUGH 11/02/2010   DYSLIPIDEMIA 06/15/2010   Palpitations 06/15/2010    Carey Bullocks, OTR/L 08/14/2021, 10:36 AM  [place] [address] [place], [place], [address] Phone: [phone]   Fax:  [phone]  Name: Sean Mendoza MRN: [id number] Date of Birth: 14-Jan-1946

## 2021-08-14 NOTE — Therapy (Signed)
[facility] [address] [place], [place], [address] Phone: [phone]   Fax:  [phone]  Physical Therapy Treatment/Renewal/Discharge Summary  Patient Details  Name: Sean Mendoza MRN: [id number] Date of Birth: 04-21-1946 Referring Provider (PT): Eldridge Abrahams   PHYSICAL THERAPY DISCHARGE SUMMARY  Visits from Start of Care: 3  Current functional level related to goals / functional outcomes:  PT Long Term Goals - 08/14/21 2104       PT LONG TERM GOAL #1   Title Pt will verbalize understanding of optimal PD fitness program post-discharge.    Time 4    Period Weeks    Status Achieved      PT LONG TERM GOAL #2   Title Pt will be independent with PD specific exercise program.    Time 4    Period Weeks    Status Achieved               Remaining deficits: High level balance with dynamic single limb stance   Education / Equipment:  Educated in [employer] and ongoing community fitness options/program.  Patient agrees to discharge. Patient goals were met. Patient is being discharged due to meeting the stated rehab goals.  Recommend return PT screen in 6-9 months.  Mady Haagensen, PT 08/14/21 9:10 PM Phone: [phone] Fax: [phone]    Encounter Date: 08/14/2021   PT End of Session - 08/14/21 2057     Visit Number 3    Number of Visits 5    Date for PT Re-Evaluation 08/14/21    Authorization Type BCBS Medicare    PT Start Time 1020    PT Stop Time 1053    PT Time Calculation (min) 33 min    Activity Tolerance Patient tolerated treatment well    Behavior During Therapy WFL for tasks assessed/performed             Past Medical History:  Diagnosis Date   Amaurosis fugax    Anxiety    Cough    wert on set 09/2010   HTN (hypertension)    Migraine    PVC's (premature ventricular contractions)     Past Surgical History:  Procedure Laterality Date   CERVICAL DISCECTOMY     x 2    ELBOW SURGERY Left    x 3   HERNIA REPAIR     inguinal x 2    There were no vitals filed for this visit.   Subjective Assessment - 08/14/21 1022     Subjective I've been travelling, had an injury (pulling my hamstring) while water skiing.  I've been still exercising as much as I can.  Pt describes his exercise routine, which does include PWR! Moves as well as gym work.    Pertinent History PD, anxiety, HTN, migraine, essential tremor, orthostatic hypotension, OCD-like behavior, cervical discectomy x 2    Patient Stated Goals want to make sure to keep doing all the things I enjoy.    Currently in Pain? No/denies                               [facility] Adult PT Treatment/Exercise - 08/14/21 0001       Transfers   Transfers Sit to Stand;Stand to Sit    Sit to Stand Without upper extremity assist;6: Modified independent (Device/Increase time)    Five time sit to stand comments  7.38    Stand to Sit 6: Modified  independent (Device/Increase time);Without upper extremity assist      Ambulation/Gait   Ambulation/Gait Yes    Ambulation/Gait Assistance 6: Modified independent (Device/Increase time)    Assistive device None    Gait Pattern Step-through pattern;Decreased arm swing - right    Ambulation Surface Level;Indoor    Gait velocity 7.72 sec = 4.24 ft/sec      Standardized Balance Assessment   Standardized Balance Assessment Timed Up and Go Test      Timed Up and Go Test   TUG Normal TUG    Normal TUG (seconds) 7.28      High Level Balance   High Level Balance Comments Pt performs PWR! Moves in standing, 5 reps each move, with cues for technique for the difference between PWR! Bennett! Step.  PWR! Moves Flow in standing x 5 reps with cues for technique.  Pt reports having the sheets and performing at home.      Exercises   Exercises Knee/Hip      Knee/Hip Exercises: Stretches   Active Hamstring Stretch Both;2 reps               Discussed  optimal fitness routine, including pt's current fitness regimine.  Answered pt's questions about continued fitness, and encouraged consistency of PWR! Moves as part of ongoing HEP.  Performed standing hamstring stretch at counter, 3 reps, 10 sec and seated hamstring stretch 3 reps 15-30 sec; cues for technique and optimal use of stretches.      PT Education - 08/14/21 2056     Education Details POC/plan to discharge and return in 6 months; addition of and importance of hamstring stretching to exercise routine    Person(s) Educated Patient    Methods Explanation;Demonstration;Handout    Comprehension Verbalized understanding;Returned demonstration                 PT Long Term Goals - 08/14/21 2104       PT LONG TERM GOAL #1   Title Pt will verbalize understanding of optimal PD fitness program post-discharge.    Time 4    Period Weeks    Status Achieved      PT LONG TERM GOAL #2   Title Pt will be independent with PD specific exercise program.    Time 4    Period Weeks    Status Achieved                   Plan - 08/14/21 2100     Clinical Impression Statement Pt has not been seen for therapy since 06/29/2021 visit, due to extended travels and then cancellation due to a pulled hamstring.  He returns today and PT goals assessed as well as measures associated with recommended return screen in 6-9 months.  Focused time on discussion/education about optimal exercise routine and benefits of optimal hamstring stretching as well as consistent performance on PWR! Moves exercises to address Parkinson's specifice deficits.  Pt is apporpriate for renewal/discharge this visit and is agreeable to return screen in 6-9 months.    Personal Factors and Comorbidities Comorbidity 3+    Comorbidities PD, anxiety, HTN, migraine, essential tremor, orthostatic hypotension, OCD-like behavior, cervical discectomy x 2    Examination-Activity Limitations --   NA   Examination-Participation  Restrictions Community Activity   Community fitness   Stability/Clinical Decision Making Stable/Uncomplicated    Rehab Potential Excellent    PT Frequency 1x / week    PT Duration Other (comment)   1 additional visit  08/14/21   PT Treatment/Interventions Neuromuscular re-education;ADLs/Self Care Home Management;Therapeutic activities;Gait training;Functional mobility training;Therapeutic exercise    PT Next Visit Plan Recert/discharge this visit with plan to return screen in 6-9 months.    Consulted and Agree with Plan of Care Patient             Patient will benefit from skilled therapeutic intervention in order to improve the following deficits and impairments:  Abnormal gait, Decreased mobility, Postural dysfunction  Visit Diagnosis: Other abnormalities of gait and mobility  Other symptoms and signs involving the nervous system     Problem List Patient Active Problem List   Diagnosis Date Noted   Orthostatic lightheadedness 12/31/2020   Sleep apnea 03/19/2011   Hypertension 03/19/2011   Right bundle branch block 03/19/2011   COUGH 11/02/2010   DYSLIPIDEMIA 06/15/2010   Palpitations 06/15/2010    Eily Louvier W., PT 08/14/2021, 9:06 PM Frazier Butt., PT   [facility] [address] [place], [place], [address] Phone: [phone]   Fax:  [phone]  Name: Sean Mendoza MRN: [id number] Date of Birth: 05/23/1946

## 2021-08-14 NOTE — Patient Instructions (Signed)
Ways to prevent future Parkinson's related complications: 1.   Exercise regularly,  2.   Focus on BIGGER movements during daily activities- really reach overhead, straighten elbows and extend fingers 3.   When dressing or reaching for your seatbelt make sure to use your body to assist by twisting while you reach-this can help to minimize stress on the shoulder and reduce the risk of a rotator cuff tear 4.   Swing your arms when you walk! People with PD are at increased risk for frozen shoulder and swinging your arms can reduce this risk. 5. Eat a healthy, high fiber diet

## 2021-08-14 NOTE — Patient Instructions (Signed)
Access Code: [email or web address] URL: [email or web address] Date: 08/14/2021 Prepared by: Lonia Blood  Exercises Seated Hamstring Stretch - 1-2 x daily - 7 x weekly - 1 sets - 3 reps - 30 sec hold

## 2021-08-22 ENCOUNTER — Ambulatory Visit: Payer: Medicare Other | Admitting: Occupational Therapy

## 2021-08-22 ENCOUNTER — Other Ambulatory Visit: Payer: Self-pay

## 2021-08-22 DIAGNOSIS — R2689 Other abnormalities of gait and mobility: Secondary | ICD-10-CM | POA: Diagnosis not present

## 2021-08-22 DIAGNOSIS — R278 Other lack of coordination: Secondary | ICD-10-CM | POA: Diagnosis not present

## 2021-08-22 DIAGNOSIS — R29818 Other symptoms and signs involving the nervous system: Secondary | ICD-10-CM

## 2021-08-22 NOTE — Therapy (Signed)
[facility] [address] [place], [place], [address] Phone: [phone]   Fax:  [phone]  Occupational Therapy Treatment  Patient Details  Name: Sean Mendoza MRN: [id number] Date of Birth: Aug 18, 1946 Referring Provider (OT): Dr. Eldridge Abrahams   Encounter Date: 08/22/2021   OT End of Session - 08/22/21 1209     Visit Number 4    Number of Visits 5    Date for OT Re-Evaluation 07/30/21   start delay due to scheduling   Authorization Type BC[employer] Medicare    OT Start Time 1015    OT Stop Time 1100    OT Time Calculation (min) 45 min    Activity Tolerance Patient tolerated treatment well    Behavior During Therapy [facility] for tasks assessed/performed             Past Medical History:  Diagnosis Date   Amaurosis fugax    Anxiety    Cough    wert on set 09/2010   HTN (hypertension)    Migraine    PVC's (premature ventricular contractions)     Past Surgical History:  Procedure Laterality Date   CERVICAL DISCECTOMY     x 2   ELBOW SURGERY Left    x 3   HERNIA REPAIR     inguinal x 2    There were no vitals filed for this visit.   Subjective Assessment - 08/22/21 1020     Subjective  My hamstring is much better    Pertinent History Parkinson's Disease.  PMH:  anxiety, HTN, migraine, Dyslipidemia, sleep apnea, R bundle branch block, Orthostatic hypotension, hx of 2 cervical surgeries, hx of L elbow surgery for tendonitis.    Limitations Pt reports some tingling R side of head/face primarily when looking down--will see Dr. Deri Fuelling 06/14/21 for this    Patient Stated Goals stay active    Currently in Pain? No/denies             Issued PWR! Quadraped basic 4 w/ cues for hands in PWR! Up, Twist and Step and cues to look at hand w/ PWR! Twist. Pt also shown modified version standing over low table if knees are bothering him.   Pt shown PWR! Hands (basic Up) as pt reports hand stiffness first  thing in the am. Recommended doing in the morning, and anytime for fine motor tasks prn  Pt shown how to look up PWR! Moves on You Tube if he needs further review later. Also emphasized continuing to stay active, staying ahead of PD related deficits/complications, and working on more opening and extension ex's at gym vs. Flexor ex's.                      OT Education - 08/22/21 1208     Education Details PWR! Quadraped basic 4 and modifications in standing prn due to occasional knee pain, PWR! Hands (basic PWR! Up)    Person(s) Educated Patient    Methods Explanation;Handout;Demonstration;Verbal cues    Comprehension Verbalized understanding;Returned demonstration                 OT Long Term Goals - 08/22/21 1210       OT LONG TERM GOAL #1   Title Pt will be independent with PD-specific HEP.--check LTGs 07/30/21 (due to delayed start)    Time 4    Period Weeks    Status Achieved      OT LONG TERM GOAL #2  Title Pt will verbalize understanding of adaptive strategies to incr ease with ADLs/IADLs and decr risk of future complications related to PD.    Time 4    Period Weeks    Status Achieved      OT LONG TERM GOAL #3   Title Pt will verbalize understanding of ways to decr risk of future complications related to PD.    Time 4    Period Weeks    Status Achieved                   Plan - 08/22/21 1210     Clinical Impression Statement Pt has met all goals. Pt very active and high functioning at this time. Emphasized remaining active and staying "ahead" of PD and related complications    OT Occupational Profile and History Detailed Assessment- Review of Records and additional review of physical, cognitive, psychosocial history related to current functional performance    Occupational performance deficits (Please refer to evaluation for details): ADL's;IADL's;Leisure    Body Structure / Function / Physical Skills  ADL;Dexterity;IADL;FMC;Coordination;Decreased knowledge of use of DME;UE functional use;Mobility    Rehab Potential Excellent    Clinical Decision Making Limited treatment options, no task modification necessary    Comorbidities Affecting Occupational Performance: May have comorbidities impacting occupational performance    Modification or Assistance to Complete Evaluation  No modification of tasks or assist necessary to complete eval    OT Frequency 1x / week    OT Duration 4 weeks   +eval (delayed start due to scheduling)   OT Treatment/Interventions Self-care/ADL training;DME and/or AE instruction;Therapeutic activities;Therapeutic exercise;Functional Mobility Training;Neuromuscular education;Patient/family education;Manual Therapy    Plan D/C O.T. - 6 month screens already scheduled   Consulted and Agree with Plan of Care Patient             Patient will benefit from skilled therapeutic intervention in order to improve the following deficits and impairments:   Body Structure / Function / Physical Skills: ADL, Dexterity, IADL, FMC, Coordination, Decreased knowledge of use of DME, UE functional use, Mobility       Visit Diagnosis: Other symptoms and signs involving the nervous system  Other lack of coordination    Problem List Patient Active Problem List   Diagnosis Date Noted   Orthostatic lightheadedness 12/31/2020   Sleep apnea 03/19/2011   Hypertension 03/19/2011   Right bundle branch block 03/19/2011   COUGH 11/02/2010   DYSLIPIDEMIA 06/15/2010   Palpitations 06/15/2010   OCCUPATIONAL THERAPY DISCHARGE SUMMARY  Visits from Start of Care: 4  Current functional level related to goals / functional outcomes: SEE ABOVE   Remaining deficits: Mild stiffness   Education / Equipment: PWR! [place]ace Moore, [place]! Hands, coordination HEP, ways to reduce future related PD complications   Patient agrees to discharge. Patient goals were met. Patient is being discharged due to  being pleased with the current functional level.Carey Bullocks, OTR/L 08/22/2021, 12:11 PM  [facility] [address] [place], [place], [address] Phone: [phone]   Fax:  [phone]  Name: Sean Mendoza MRN: [id number] Date of Birth: 07/21/1946

## 2021-08-25 DIAGNOSIS — F411 Generalized anxiety disorder: Secondary | ICD-10-CM | POA: Diagnosis not present

## 2021-08-25 DIAGNOSIS — E785 Hyperlipidemia, unspecified: Secondary | ICD-10-CM | POA: Diagnosis not present

## 2021-08-25 DIAGNOSIS — I10 Essential (primary) hypertension: Secondary | ICD-10-CM | POA: Diagnosis not present

## 2021-08-25 DIAGNOSIS — G20 Parkinson's disease: Secondary | ICD-10-CM | POA: Diagnosis not present

## 2021-09-08 DIAGNOSIS — L820 Inflamed seborrheic keratosis: Secondary | ICD-10-CM | POA: Diagnosis not present

## 2021-09-08 DIAGNOSIS — L570 Actinic keratosis: Secondary | ICD-10-CM | POA: Diagnosis not present

## 2021-09-08 DIAGNOSIS — Z85828 Personal history of other malignant neoplasm of skin: Secondary | ICD-10-CM | POA: Diagnosis not present

## 2021-09-11 ENCOUNTER — Other Ambulatory Visit: Payer: Self-pay | Admitting: Psychiatry

## 2021-09-11 DIAGNOSIS — G4752 REM sleep behavior disorder: Secondary | ICD-10-CM

## 2021-09-11 NOTE — Telephone Encounter (Signed)
Call to RS

## 2021-09-14 ENCOUNTER — Other Ambulatory Visit (HOSPITAL_BASED_OUTPATIENT_CLINIC_OR_DEPARTMENT_OTHER): Payer: Self-pay

## 2021-09-14 MED ORDER — INFLUENZA VAC A&B SA ADJ QUAD 0.5 ML IM PRSY
PREFILLED_SYRINGE | INTRAMUSCULAR | 0 refills | Status: DC
  Filled 2021-09-14: qty 0.5, 1d supply, fill #0

## 2021-09-15 ENCOUNTER — Ambulatory Visit: Payer: Medicare Other | Admitting: Neurology

## 2021-09-29 DIAGNOSIS — G20 Parkinson's disease: Secondary | ICD-10-CM | POA: Diagnosis not present

## 2021-09-29 DIAGNOSIS — U071 COVID-19: Secondary | ICD-10-CM | POA: Diagnosis not present

## 2021-10-06 NOTE — Telephone Encounter (Signed)
Scheduled appt 12/1

## 2021-10-20 DIAGNOSIS — J988 Other specified respiratory disorders: Secondary | ICD-10-CM | POA: Diagnosis not present

## 2021-10-20 DIAGNOSIS — G20 Parkinson's disease: Secondary | ICD-10-CM | POA: Diagnosis not present

## 2021-11-01 DIAGNOSIS — E785 Hyperlipidemia, unspecified: Secondary | ICD-10-CM | POA: Diagnosis not present

## 2021-11-01 DIAGNOSIS — G20 Parkinson's disease: Secondary | ICD-10-CM | POA: Diagnosis not present

## 2021-11-01 DIAGNOSIS — I10 Essential (primary) hypertension: Secondary | ICD-10-CM | POA: Diagnosis not present

## 2021-11-01 DIAGNOSIS — G4700 Insomnia, unspecified: Secondary | ICD-10-CM | POA: Diagnosis not present

## 2021-11-02 ENCOUNTER — Telehealth (INDEPENDENT_AMBULATORY_CARE_PROVIDER_SITE_OTHER): Payer: Medicare Other | Admitting: Psychiatry

## 2021-11-02 ENCOUNTER — Encounter: Payer: Self-pay | Admitting: Psychiatry

## 2021-11-02 DIAGNOSIS — F422 Mixed obsessional thoughts and acts: Secondary | ICD-10-CM

## 2021-11-02 DIAGNOSIS — G4752 REM sleep behavior disorder: Secondary | ICD-10-CM | POA: Diagnosis not present

## 2021-11-02 DIAGNOSIS — F331 Major depressive disorder, recurrent, moderate: Secondary | ICD-10-CM | POA: Diagnosis not present

## 2021-11-02 MED ORDER — CLONAZEPAM 1 MG PO TABS: 1.0000 mg | ORAL_TABLET | Freq: Every day | ORAL | 1 refills | Status: DC

## 2021-11-02 MED ORDER — FLUOXETINE HCL 40 MG PO CAPS: 40.0000 mg | ORAL_CAPSULE | Freq: Every day | ORAL | 1 refills | Status: DC
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
## 2021-12-21 DIAGNOSIS — H5202 Hypermetropia, left eye: Secondary | ICD-10-CM | POA: Diagnosis not present

## 2021-12-28 ENCOUNTER — Other Ambulatory Visit: Payer: Self-pay | Admitting: Internal Medicine

## 2022-01-01 DIAGNOSIS — G4700 Insomnia, unspecified: Secondary | ICD-10-CM | POA: Diagnosis not present

## 2022-01-01 DIAGNOSIS — G20 Parkinson's disease: Secondary | ICD-10-CM | POA: Diagnosis not present

## 2022-01-01 DIAGNOSIS — E785 Hyperlipidemia, unspecified: Secondary | ICD-10-CM | POA: Diagnosis not present

## 2022-01-01 DIAGNOSIS — I10 Essential (primary) hypertension: Secondary | ICD-10-CM | POA: Diagnosis not present

## 2022-01-03 DIAGNOSIS — Z9181 History of falling: Secondary | ICD-10-CM | POA: Diagnosis not present

## 2022-01-03 DIAGNOSIS — Z79899 Other long term (current) drug therapy: Secondary | ICD-10-CM | POA: Diagnosis not present

## 2022-01-03 DIAGNOSIS — G20 Parkinson's disease: Secondary | ICD-10-CM | POA: Diagnosis not present

## 2022-01-03 DIAGNOSIS — G4752 REM sleep behavior disorder: Secondary | ICD-10-CM | POA: Diagnosis not present

## 2022-02-12 ENCOUNTER — Other Ambulatory Visit: Payer: Self-pay

## 2022-02-12 MED ORDER — DILTIAZEM HCL ER COATED BEADS 120 MG PO CP24: ORAL_CAPSULE | ORAL | 0 refills | Status: DC

## 2022-02-12 NOTE — Telephone Encounter (Signed)
Pt's medication was sent to pt's pharmacy as requested. Confirmation received.  °

## 2022-02-27 ENCOUNTER — Ambulatory Visit: Payer: Medicare Other | Admitting: Occupational Therapy

## 2022-02-27 ENCOUNTER — Ambulatory Visit: Payer: Medicare Other | Admitting: Physical Therapy

## 2022-03-05 ENCOUNTER — Other Ambulatory Visit: Payer: Self-pay | Admitting: Internal Medicine

## 2022-03-09 ENCOUNTER — Other Ambulatory Visit: Payer: Self-pay | Admitting: Psychiatry

## 2022-03-09 ENCOUNTER — Other Ambulatory Visit: Payer: Self-pay | Admitting: Internal Medicine

## 2022-03-09 DIAGNOSIS — F422 Mixed obsessional thoughts and acts: Secondary | ICD-10-CM

## 2022-03-09 DIAGNOSIS — F331 Major depressive disorder, recurrent, moderate: Secondary | ICD-10-CM

## 2022-03-13 ENCOUNTER — Ambulatory Visit: Payer: Medicare Other | Admitting: Internal Medicine

## 2022-03-13 ENCOUNTER — Encounter: Payer: Self-pay | Admitting: Internal Medicine

## 2022-03-13 VITALS — BP 123/84 | HR 67 | Ht 72.0 in | Wt 204.0 lb

## 2022-03-13 DIAGNOSIS — I4510 Unspecified right bundle-branch block: Secondary | ICD-10-CM | POA: Diagnosis not present

## 2022-03-13 DIAGNOSIS — R002 Palpitations: Secondary | ICD-10-CM

## 2022-03-13 DIAGNOSIS — R42 Dizziness and giddiness: Secondary | ICD-10-CM

## 2022-03-13 NOTE — Patient Instructions (Addendum)
Medication Instructions:  ?Your physician has recommended you make the following change in your medication:  ? ?** Stop Aspirin ? ?*If you need a refill on your cardiac medications before your next appointment, please call your pharmacy* ? ? ?Lab Work: ?None ordered. ? ?If you have labs (blood work) drawn today and your tests are completely normal, you will receive your results only by: ?MyChart Message (if you have MyChart) OR ?A paper copy in the mail ?If you have any lab test that is abnormal or we need to change your treatment, we will call you to review the results. ? ? ?Testing/Procedures: ?None ordered. ? ? ? ?Follow-Up: ?At [facility], you and your health needs are our priority.  As part of our continuing mission to provide you with exceptional heart care, we have created designated Provider Care Teams.  These Care Teams include your primary Cardiologist (physician) and Advanced Practice Providers (APPs -  Physician Assistants and Nurse Practitioners) who all work together to provide you with the care you need, when you need it. ? ?We recommend signing up for the patient portal called "MyChart".  Sign up information is provided on this After Visit Summary.  MyChart is used to connect with patients for Virtual Visits (Telemedicine).  Patients are able to view lab/test results, encounter notes, upcoming appointments, etc.  Non-urgent messages can be sent to your provider as well.   ?To learn more about what you can do with MyChart, go to [email or web address].   ? ?Your next appointment:   ?12 months with Dr Graciela Husbands ? ?Important Information About Sugar ? ? ? ? ?  ?

## 2022-03-13 NOTE — Progress Notes (Signed)
Patient Care Team: ?[name], MD as PCP - General (Family Medicine) ? ? ?HPI ? ?Sean Mendoza is a 76 y.o. male ?seen with PVCs that suppress with exercise and are adrenergically sensitive.   ? ?Struggling with anxiety related to Covid.   ? ?Seen neurology neurology and has had a second opinion at [facility] and confirming a diagnosis of Parkinsons Dis (PD)  ?  ? ?PVCs occur in clusters, mostly quiescient ? ?The patient denies chest pain, shortness of breath, nocturnal dyspnea, orthopnea or peripheral edema.  There have been syncope; some lightheadedness particularly with standing.   ? ? ? ?Past Medical History:  ?Diagnosis Date  ? Amaurosis fugax   ? Anxiety   ? Cough   ? wert on set 09/2010  ? HTN (hypertension)   ? Migraine   ? PVC's (premature ventricular contractions)   ? ? ?Past Surgical History:  ?Procedure Laterality Date  ? CERVICAL DISCECTOMY    ? x 2  ? ELBOW SURGERY Left   ? x 3  ? HERNIA REPAIR    ? inguinal x 2  ? ? ?Current Outpatient Medications  ?Medication Sig Dispense Refill  ? atorvastatin (LIPITOR) 20 MG tablet TAKE 1 TABLET BY MOUTH EVERY DAY 90 tablet 0  ? carbidopa-levodopa (SINEMET IR) 25-100 MG tablet Take 1 tablet by mouth 3 (three) times daily.    ? clonazePAM (KLONOPIN) 1 MG tablet Take 1 tablet (1 mg total) by mouth daily. (Patient taking differently: Take 1.5 mg by mouth daily.) 90 tablet 1  ? Coenzyme Q10 (COQ-10 PO) Take 1 tablet by mouth daily.    ? diltiazem (CARDIZEM CD) 120 MG 24 hr capsule TAKE 1 CAPSULE BY MOUTH EVERY DAY. Please keep upcoming appt in April 2023 with Dr. Graciela Husbands before anymore refills. Thank you Final Attempt 90 capsule 0  ? FLUoxetine (PROZAC) 40 MG capsule TAKE 1 CAPSULE (40 MG TOTAL) BY MOUTH DAILY. 60 capsule 0  ? levETIRAcetam (KEPPRA) 250 MG tablet Take 250 mg by mouth daily.    ? MAGNESIUM GLUCONATE PO Take 400 mg by mouth daily.     ? Melatonin 10 MG TABS at bedtime.    ? propranolol (INDERAL) 10 MG tablet TAKE 1-2 TABLETS EVERY 12 HOURS FOR TREMOR  (Patient taking differently: 10 mg daily at 6 (six) AM. TAKE 1-2 TABLETS EVERY 12 HOURS FOR TREMOR) 360 tablet 2  ? vitamin B-12 (CYANOCOBALAMIN) 1000 MCG tablet Take 1,000 mcg by mouth daily.    ? ?No current facility-administered medications for this visit.  ? ? ?Allergies  ?Allergen Reactions  ? Ampicillin   ?  REACTION: itching  ? Promethazine Hcl Other (See Comments)  ? Sulfonamide Derivatives   ?  unknown  ? ? ?Review of Systems negative except from HPI and PMH ? ?Physical Exam ?BP 123/84   Pulse 67   Ht 6' (1.829 m)   Wt 204 lb (92.5 kg)   SpO2 96%   BMI 27.67 kg/m?  ?Well developed and nourished in no acute distress ?HENT normal ?Neck supple with JVP-  flat   ?Clear ?Regular rate and rhythm, no murmurs or gallops ?Abd-soft with active BS ?No Clubbing cyanosis edema ?Skin-warm and dry ?A & Oriented  Grossly normal sensory and motor function ? ?ECG sinus @ 67  ?No PVCs  ? ? ?Assessment and  Plan ? ?PVCs    ? ?HTN   ? ?Hypercholesterolemia    ? ?RBBB/LAFB ? ?Orthostatic lightheadedness ? ?parkinsons ? ?GAD/Depression  ? ?Migraine--Keppra  ? ?  Parkinsons ? ?PVCs are relatively quiescient.  Continue on Cardizem 120 mg a day and magnesium. ? ?Blood pressure is well controlled on the diltiazem 120.  Some orthostasis; literature review suggests that the levodopa does not aggravate orthostasis. ? ?His significant sleep behavioral issues by falling out of bed.  Not sure that he will do better on the current medication started by neurology at [facility] and I encouraged him to follow-up with [facility] to discuss next steps ? ? ? ? ?

## 2022-03-27 ENCOUNTER — Encounter: Payer: Self-pay | Admitting: Occupational Therapy

## 2022-03-27 ENCOUNTER — Ambulatory Visit: Payer: Medicare Other

## 2022-03-27 ENCOUNTER — Ambulatory Visit: Payer: Medicare Other | Attending: Family Medicine | Admitting: Occupational Therapy

## 2022-03-27 ENCOUNTER — Ambulatory Visit: Payer: Medicare Other | Admitting: Physical Therapy

## 2022-03-27 DIAGNOSIS — R471 Dysarthria and anarthria: Secondary | ICD-10-CM

## 2022-03-27 DIAGNOSIS — R29818 Other symptoms and signs involving the nervous system: Secondary | ICD-10-CM

## 2022-03-27 NOTE — Therapy (Signed)
[facility] ?[address] ?[place], [place], [address] ?Phone: [phone]   Fax:  [phone] ? ?Patient Details  ?Name: Sean Mendoza ?MRN: [id number] ?Date of Birth: 25-Aug-1946 ?Referring Provider:  [name], MD ? ?Encounter Date: 03/27/2022 ?Physical Therapy Parkinson's Disease Screen ? ? ?Timed Up and Go test: 6.3 seconds  (previously 7.28 seconds) ? ?10 meter walk test:6.65 seconds=4.93 ft/sec  (previously  4.24 ft/sec) ? ?5 time sit to stand test:7.6 seconds  (previously 7.38 seconds) ? ?Doing the elliptical 5x a week for about 1 hour, golfing 4-5 times a week, and walking.  ? ?Patient does not require Physical Therapy services at this time.  Recommend Physical Therapy screen in 6 months.  ? ? ? ?Drake Leach, PT, DPT  ?03/27/2022, 1:47 PM ? ?[facility] ?[address] ?[place], [place], [address] ?Phone: [phone]   Fax:  [phone] ?

## 2022-03-27 NOTE — Therapy (Signed)
[facility] ?[address] ?[place], [place], [address] ?Phone: [phone]   Fax:  [phone] ? ?Patient Details  ?Name: Sean Mendoza ?MRN: [id number] ?Date of Birth: [birth year] ?Referring Provider:  [name], MD ? ?Encounter Date: 03/27/2022 ? ? ?Occupational Therapy Parkinson's Disease Screen ? ?Hand dominance:  right  ? ?Physical Performance Test item #4 (donning/doffing jacket):  11.78 sec ? ?Fastening/unfastening 3 buttons in:  25.84sec ? ?9-hole peg test:    RUE  31.25 sec        LUE  30.66 sec ? ?Box & Blocks Test:   RUE  57 blocks        LUE  57 blocks ? ?Change in ability to perform ADLs/IADLs:  no ? ? ?Pt does not require occupational therapy services at this time.  Recommended occupational therapy screen in   approx 6 months. ? ? ? Willa Frater, OT ?03/27/2022, 1:21 PM ? ? ?[facility] ?[address] ?[place], [place], [address] ?Phone: [phone]   Fax:  [phone] ? ? ?Willa Frater, OTR/L ?[facility] ?[address] ?[place], [place]  [address] ?[phone] phone ?[phone] ?03/27/22 1:31 PM ? ? ?

## 2022-03-27 NOTE — Therapy (Signed)
[place] ?[address] ?[place], [place], [address] ?Phone: [phone]   Fax:  [phone] ? ?Patient Details  ?Name: Sean Mendoza ?MRN: [id number] ?Date of Birth: 02-07-1946 ?Referring Provider: Jaquita Folds, MD  ? ?Encounter Date: 03/27/2022 ? ?Speech Therapy Parkinson's Disease Screen  ? ?Decibel Level today: low 70s dB (WNL=70-72 dB) with sound level meter 30cm away from pt's mouth. Pt's conversational volume has remained the same since last treatment course. Pt was 100% intelligible in quiet environment.   ? ?Pt does not report difficulty with swallowing or cognition warranting further evaluation. ? ?Pt does does not require speech therapy services at this time. Recommend ST screen in another 6-8 months. ? ?Gracy Racer, CCC-SLP ?03/27/2022, 1:11 PM ? ?[place] [facility] ?[address] ?[place], [place], [address] ?Phone: [phone]   Fax:  [phone] ?

## 2022-04-11 DIAGNOSIS — H903 Sensorineural hearing loss, bilateral: Secondary | ICD-10-CM | POA: Diagnosis not present

## 2022-04-12 ENCOUNTER — Other Ambulatory Visit: Payer: Self-pay | Admitting: Internal Medicine

## 2022-04-24 ENCOUNTER — Encounter: Payer: Self-pay | Admitting: Internal Medicine

## 2022-04-25 DIAGNOSIS — G4700 Insomnia, unspecified: Secondary | ICD-10-CM | POA: Diagnosis not present

## 2022-04-25 DIAGNOSIS — G20 Parkinson's disease: Secondary | ICD-10-CM | POA: Diagnosis not present

## 2022-04-25 DIAGNOSIS — E785 Hyperlipidemia, unspecified: Secondary | ICD-10-CM | POA: Diagnosis not present

## 2022-04-25 DIAGNOSIS — I10 Essential (primary) hypertension: Secondary | ICD-10-CM | POA: Diagnosis not present

## 2022-04-27 ENCOUNTER — Other Ambulatory Visit (HOSPITAL_BASED_OUTPATIENT_CLINIC_OR_DEPARTMENT_OTHER): Payer: Self-pay

## 2022-05-03 ENCOUNTER — Telehealth: Payer: Medicare Other | Admitting: Psychiatry

## 2022-05-03 DIAGNOSIS — H903 Sensorineural hearing loss, bilateral: Secondary | ICD-10-CM | POA: Diagnosis not present

## 2022-06-10 ENCOUNTER — Other Ambulatory Visit: Payer: Self-pay | Admitting: Psychiatry

## 2022-06-10 DIAGNOSIS — F331 Major depressive disorder, recurrent, moderate: Secondary | ICD-10-CM

## 2022-06-10 DIAGNOSIS — F422 Mixed obsessional thoughts and acts: Secondary | ICD-10-CM

## 2022-06-11 ENCOUNTER — Telehealth: Payer: Medicare Other | Admitting: Psychiatry

## 2022-06-26 DIAGNOSIS — L570 Actinic keratosis: Secondary | ICD-10-CM | POA: Diagnosis not present

## 2022-06-26 DIAGNOSIS — Z85828 Personal history of other malignant neoplasm of skin: Secondary | ICD-10-CM | POA: Diagnosis not present

## 2022-07-05 DIAGNOSIS — G250 Essential tremor: Secondary | ICD-10-CM | POA: Diagnosis not present

## 2022-07-05 DIAGNOSIS — Z79899 Other long term (current) drug therapy: Secondary | ICD-10-CM | POA: Diagnosis not present

## 2022-07-05 DIAGNOSIS — G4752 REM sleep behavior disorder: Secondary | ICD-10-CM | POA: Diagnosis not present

## 2022-07-05 DIAGNOSIS — G20 Parkinson's disease: Secondary | ICD-10-CM | POA: Diagnosis not present

## 2022-07-05 DIAGNOSIS — Z760 Encounter for issue of repeat prescription: Secondary | ICD-10-CM | POA: Diagnosis not present

## 2022-07-28 ENCOUNTER — Other Ambulatory Visit: Payer: Self-pay | Admitting: Internal Medicine

## 2022-07-28 DIAGNOSIS — G250 Essential tremor: Secondary | ICD-10-CM

## 2022-08-29 ENCOUNTER — Telehealth (INDEPENDENT_AMBULATORY_CARE_PROVIDER_SITE_OTHER): Payer: Medicare Other | Admitting: Psychiatry

## 2022-08-29 ENCOUNTER — Encounter: Payer: Self-pay | Admitting: Psychiatry

## 2022-08-29 DIAGNOSIS — F331 Major depressive disorder, recurrent, moderate: Secondary | ICD-10-CM

## 2022-08-29 DIAGNOSIS — G4752 REM sleep behavior disorder: Secondary | ICD-10-CM | POA: Diagnosis not present

## 2022-08-29 DIAGNOSIS — F422 Mixed obsessional thoughts and acts: Secondary | ICD-10-CM

## 2022-08-29 DIAGNOSIS — G250 Essential tremor: Secondary | ICD-10-CM | POA: Diagnosis not present

## 2022-08-29 MED ORDER — CLONAZEPAM 1 MG PO TABS: 1.5000 mg | ORAL_TABLET | Freq: Every day | ORAL | 1 refills | Status: AC

## 2022-08-29 MED ORDER — FLUOXETINE HCL 40 MG PO CAPS: 40.0000 mg | ORAL_CAPSULE | Freq: Every day | ORAL | 1 refills | Status: DC
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
## 2022-09-25 DIAGNOSIS — Z0000 Encounter for general adult medical examination without abnormal findings: Secondary | ICD-10-CM | POA: Diagnosis not present

## 2022-09-25 DIAGNOSIS — D7589 Other specified diseases of blood and blood-forming organs: Secondary | ICD-10-CM | POA: Diagnosis not present

## 2022-09-25 DIAGNOSIS — E785 Hyperlipidemia, unspecified: Secondary | ICD-10-CM | POA: Diagnosis not present

## 2022-09-25 DIAGNOSIS — R7309 Other abnormal glucose: Secondary | ICD-10-CM | POA: Diagnosis not present

## 2022-09-26 DIAGNOSIS — E785 Hyperlipidemia, unspecified: Secondary | ICD-10-CM | POA: Diagnosis not present

## 2022-09-26 DIAGNOSIS — G4700 Insomnia, unspecified: Secondary | ICD-10-CM | POA: Diagnosis not present

## 2022-09-26 DIAGNOSIS — I10 Essential (primary) hypertension: Secondary | ICD-10-CM | POA: Diagnosis not present

## 2022-09-27 DIAGNOSIS — R7309 Other abnormal glucose: Secondary | ICD-10-CM | POA: Diagnosis not present

## 2022-09-27 DIAGNOSIS — Z0000 Encounter for general adult medical examination without abnormal findings: Secondary | ICD-10-CM | POA: Diagnosis not present

## 2022-09-27 DIAGNOSIS — I10 Essential (primary) hypertension: Secondary | ICD-10-CM | POA: Diagnosis not present

## 2022-09-27 DIAGNOSIS — E785 Hyperlipidemia, unspecified: Secondary | ICD-10-CM | POA: Diagnosis not present

## 2022-10-02 DIAGNOSIS — Z6827 Body mass index (BMI) 27.0-27.9, adult: Secondary | ICD-10-CM | POA: Diagnosis not present

## 2022-10-02 DIAGNOSIS — M5459 Other low back pain: Secondary | ICD-10-CM | POA: Diagnosis not present

## 2022-11-15 ENCOUNTER — Ambulatory Visit: Payer: Medicare Other

## 2022-11-15 ENCOUNTER — Ambulatory Visit: Payer: Medicare Other | Attending: Family Medicine | Admitting: Physical Therapy

## 2022-11-15 ENCOUNTER — Ambulatory Visit: Payer: Medicare Other | Admitting: Occupational Therapy

## 2022-11-15 DIAGNOSIS — R278 Other lack of coordination: Secondary | ICD-10-CM

## 2022-11-15 DIAGNOSIS — R2689 Other abnormalities of gait and mobility: Secondary | ICD-10-CM

## 2022-11-15 DIAGNOSIS — R471 Dysarthria and anarthria: Secondary | ICD-10-CM | POA: Insufficient documentation

## 2022-11-15 NOTE — Therapy (Signed)
Occupational Therapy Parkinson's Disease Screen  Hand dominance:  RUE    Physical Performance Test item #4 (donning/doffing jacket):  15.12 sec  9-hole peg test:    RUE  29.47 sec        LUE  34.63 sec  Box & Blocks Test:   RUE  57 blocks        LUE  63 blocks  Change in ability to perform ADLs/IADLs:  no  Other Comments:  handwriting is legible and good letter size   Pt does not require occupational therapy services at this time.  Recommended occupational therapy screen in   6 mons   Keene Breath, OTR/L [phone] Phone: [phone] 1:25 PM 11/15/22

## 2022-11-15 NOTE — Therapy (Signed)
[facility] [address] [place], [place], [address] Phone: [phone]   Fax:  [phone]  Patient Details  Name: TALLEY KREISER MRN: [id number] Date of Birth: [birth year] Referring Provider:  [name], MD  Encounter Date: 11/15/2022  Physical Therapy Parkinson's Disease Screen   Timed Up and Go test: 6.3 seconds (previously 6.3 seconds)  10 meter walk test: 7.73 seconds = 4.24 ft/sec  (previous 4.93 ft/sec)  5 time sit to stand test:6.4 seconds (previously 7.6 seconds)  Has been working out everyday - does the elliptical, lifting weights and playing golf. Does a stretching program 2x a week. Reports balance is getting better.    Patient does not require Physical Therapy services at this time.  Recommend Physical Therapy screen in 6-8 months     Drake Leach, PT, DPT 11/15/2022, 8:07 AM  [facility] [address] [place], [place], [address] Phone: [phone]   Fax:  [phone]

## 2022-11-15 NOTE — Therapy (Addendum)
[facility] [address] [place], [place], [address] Phone: [phone]   Fax:  [phone]  Patient Details  Name: Sean Mendoza MRN: [id number] Date of Birth: Aug 13, 1946 Referring Provider: Jaquita Folds, MD    Encounter Date: 11/15/2022  Speech Therapy Parkinson's Disease Screen   Decibel Level today: low 70s dB  (WNL=70-72 dB) with sound level meter 30cm away from pt's mouth. Pt's conversational volume has remained the same since last ST screener.  Pt does not report difficulty with swallowing or cognition, which does not warrant further evaluation.  Pt does not require speech therapy services at this time. Recommend ST screen in another 6-8 months  Gracy Racer, CCC-SLP 11/15/2022, 8:07 AM  [facility] [address] [place], [place], [address] Phone: [phone]   Fax:  [phone]

## 2022-11-21 DIAGNOSIS — D485 Neoplasm of uncertain behavior of skin: Secondary | ICD-10-CM | POA: Diagnosis not present

## 2022-11-21 DIAGNOSIS — L821 Other seborrheic keratosis: Secondary | ICD-10-CM | POA: Diagnosis not present

## 2022-11-21 DIAGNOSIS — C44519 Basal cell carcinoma of skin of other part of trunk: Secondary | ICD-10-CM | POA: Diagnosis not present

## 2022-11-21 DIAGNOSIS — Z85828 Personal history of other malignant neoplasm of skin: Secondary | ICD-10-CM | POA: Diagnosis not present

## 2022-11-21 DIAGNOSIS — L570 Actinic keratosis: Secondary | ICD-10-CM | POA: Diagnosis not present

## 2022-12-11 DIAGNOSIS — G4752 REM sleep behavior disorder: Secondary | ICD-10-CM | POA: Diagnosis not present

## 2022-12-11 DIAGNOSIS — G43109 Migraine with aura, not intractable, without status migrainosus: Secondary | ICD-10-CM | POA: Diagnosis not present

## 2022-12-11 DIAGNOSIS — G20A1 Parkinson's disease without dyskinesia, without mention of fluctuations: Secondary | ICD-10-CM | POA: Diagnosis not present

## 2022-12-11 DIAGNOSIS — G250 Essential tremor: Secondary | ICD-10-CM | POA: Diagnosis not present

## 2022-12-24 DIAGNOSIS — H524 Presbyopia: Secondary | ICD-10-CM | POA: Diagnosis not present

## 2022-12-26 DIAGNOSIS — K080 Exfoliation of teeth due to systemic causes: Secondary | ICD-10-CM | POA: Diagnosis not present

## 2023-01-28 DIAGNOSIS — K080 Exfoliation of teeth due to systemic causes: Secondary | ICD-10-CM | POA: Diagnosis not present

## 2023-02-06 DIAGNOSIS — G20A1 Parkinson's disease without dyskinesia, without mention of fluctuations: Secondary | ICD-10-CM | POA: Diagnosis not present

## 2023-02-06 DIAGNOSIS — Z03818 Encounter for observation for suspected exposure to other biological agents ruled out: Secondary | ICD-10-CM | POA: Diagnosis not present

## 2023-02-06 DIAGNOSIS — R059 Cough, unspecified: Secondary | ICD-10-CM | POA: Diagnosis not present

## 2023-02-13 DIAGNOSIS — G4752 REM sleep behavior disorder: Secondary | ICD-10-CM | POA: Diagnosis not present

## 2023-02-13 DIAGNOSIS — G20A1 Parkinson's disease without dyskinesia, without mention of fluctuations: Secondary | ICD-10-CM | POA: Diagnosis not present

## 2023-02-19 DIAGNOSIS — H9113 Presbycusis, bilateral: Secondary | ICD-10-CM | POA: Diagnosis not present

## 2023-03-06 ENCOUNTER — Telehealth: Payer: Self-pay | Admitting: Internal Medicine

## 2023-03-06 NOTE — Telephone Encounter (Signed)
Pt would like a callback regarding sooner f/u appt with provider than what his calendar has. Pt states that provider told him to call to be worked in sooner than what's available. Please advise

## 2023-03-07 ENCOUNTER — Other Ambulatory Visit: Payer: Self-pay | Admitting: Psychiatry

## 2023-03-07 DIAGNOSIS — F422 Mixed obsessional thoughts and acts: Secondary | ICD-10-CM

## 2023-03-07 DIAGNOSIS — F331 Major depressive disorder, recurrent, moderate: Secondary | ICD-10-CM

## 2023-03-08 NOTE — Telephone Encounter (Signed)
Please call to schedule an appt, was due in March.  

## 2023-03-14 DIAGNOSIS — K080 Exfoliation of teeth due to systemic causes: Secondary | ICD-10-CM | POA: Diagnosis not present

## 2023-03-14 NOTE — Telephone Encounter (Signed)
Sent My-Chart message

## 2023-03-15 ENCOUNTER — Encounter: Payer: Self-pay | Admitting: Internal Medicine

## 2023-03-15 ENCOUNTER — Ambulatory Visit: Payer: Medicare Other | Attending: Internal Medicine | Admitting: Internal Medicine

## 2023-03-15 VITALS — BP 110/78 | HR 77 | Ht 72.0 in | Wt 211.6 lb

## 2023-03-15 DIAGNOSIS — I4510 Unspecified right bundle-branch block: Secondary | ICD-10-CM | POA: Diagnosis not present

## 2023-03-15 DIAGNOSIS — I493 Ventricular premature depolarization: Secondary | ICD-10-CM | POA: Insufficient documentation

## 2023-03-15 DIAGNOSIS — R42 Dizziness and giddiness: Secondary | ICD-10-CM | POA: Diagnosis not present

## 2023-03-15 NOTE — Progress Notes (Signed)
Patient Care Team: [name], MD as PCP - General (Family Medicine)   HPI  Sean Mendoza is a 77 y.o. 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    Seen neurology neurology and has had a second opinion at [facility] and confirming a diagnosis of Parkinsons Dis (PD)   Parkinson's has remained marked largely at bay  Palpitations are infrequent.  Exercise tolerance is limited without chest pain or shortness of breath    Past Medical History:  Diagnosis Date   Amaurosis fugax    Anxiety    Cough    wert on set 09/2010   HTN (hypertension)    Migraine    PVC's (premature ventricular contractions)     Past Surgical History:  Procedure Laterality Date   CERVICAL DISCECTOMY     x 2   ELBOW SURGERY Left    x 3   HERNIA REPAIR     inguinal x 2    Current Outpatient Medications  Medication Sig Dispense Refill   atorvastatin (LIPITOR) 20 MG tablet TAKE 1 TABLET BY MOUTH EVERY DAY 90 tablet 3   B Complex Vitamins (B COMPLEX PO) Take by mouth. 1 tablet daily     carbidopa-levodopa (SINEMET IR) 25-100 MG tablet Take 1 tablet by mouth 3 (three) times daily.     clonazePAM (KLONOPIN) 1 MG tablet Take 1.5 tablets (1.5 mg total) by mouth at bedtime. 135 tablet 1   Coenzyme Q10 (COQ-10 PO) Take 1 tablet by mouth daily.     diltiazem (CARDIZEM CD) 120 MG 24 hr capsule TAKE 1 CAPSULE BY MOUTH EVERY DAY. 90 capsule 3   FLUoxetine (PROZAC) 40 MG capsule Take 1 capsule (40 mg total) by mouth daily. 90 capsule 1   levETIRAcetam (KEPPRA) 250 MG tablet Take 250 mg by mouth daily.     MAGNESIUM GLUCONATE PO Take 400 mg by mouth daily.      Melatonin 10 MG TABS at bedtime.     propranolol (INDERAL) 10 MG tablet TAKE 1 TO 2 TABLETS EVERY 12 HOURS FOR TREMOR (Patient taking differently: 1 daily) 360 tablet 2   No current facility-administered medications for this visit.    Allergies  Allergen Reactions   Ampicillin     REACTION: itching    Promethazine Hcl Other (See Comments)   Sulfonamide Derivatives     unknown    Review of Systems negative except from HPI and PMH  Physical Exam BP 110/78   Pulse 77   Ht 6' (1.829 m)   Wt 211 lb 9.6 oz (96 kg)   SpO2 97%   BMI 28.70 kg/m  Well developed and nourished in no acute distress HENT normal Neck supple with JVP-  flat   Clear Regular rate and rhythm, no murmurs or gallops Abd-soft with active BS No Clubbing cyanosis edema Skin-warm and dry A & Oriented  Grossly normal sensory and motor function  ECG sinus at 77  06/14/41 PVCs-frequent with a QRSd of about 130 ms    Assessment and  Plan  PVCs     HTN    Parkinsons Blood pressure well-controlled.  Will continue him on Cardizem 120  Continue Cardizem also for the PVCs  Will plan to see in 1 year

## 2023-03-15 NOTE — Patient Instructions (Signed)
Medication Instructions:  Your physician recommends that you continue on your current medications as directed. Please refer to the Current Medication list given to you today.  *If you need a refill on your cardiac medications before your next appointment, please call your pharmacy*   Lab Work: None ordered.  If you have labs (blood work) drawn today and your tests are completely normal, you will receive your results only by: MyChart Message (if you have MyChart) OR A paper copy in the mail If you have any lab test that is abnormal or we need to change your treatment, we will call you to review the results.   Testing/Procedures: None ordered.    Follow-Up: At [facility], you and your health needs are our priority.  As part of our continuing mission to provide you with exceptional heart care, we have created designated Provider Care Teams.  These Care Teams include your primary Cardiologist (physician) and Advanced Practice Providers (APPs -  Physician Assistants and Nurse Practitioners) who all work together to provide you with the care you need, when you need it.  We recommend signing up for the patient portal called "MyChart".  Sign up information is provided on this After Visit Summary.  MyChart is used to connect with patients for Virtual Visits (Telemedicine).  Patients are able to view lab/test results, encounter notes, upcoming appointments, etc.  Non-urgent messages can be sent to your provider as well.   To learn more about what you can do with MyChart, go to [email or web address].    Your next appointment:   12 months with Dr Klein 

## 2023-03-26 DIAGNOSIS — R7309 Other abnormal glucose: Secondary | ICD-10-CM | POA: Diagnosis not present

## 2023-03-26 DIAGNOSIS — D7589 Other specified diseases of blood and blood-forming organs: Secondary | ICD-10-CM | POA: Diagnosis not present

## 2023-03-26 DIAGNOSIS — E785 Hyperlipidemia, unspecified: Secondary | ICD-10-CM | POA: Diagnosis not present

## 2023-03-26 DIAGNOSIS — H903 Sensorineural hearing loss, bilateral: Secondary | ICD-10-CM | POA: Diagnosis not present

## 2023-03-29 DIAGNOSIS — I10 Essential (primary) hypertension: Secondary | ICD-10-CM | POA: Diagnosis not present

## 2023-03-29 DIAGNOSIS — G20A1 Parkinson's disease without dyskinesia, without mention of fluctuations: Secondary | ICD-10-CM | POA: Diagnosis not present

## 2023-03-29 DIAGNOSIS — E785 Hyperlipidemia, unspecified: Secondary | ICD-10-CM | POA: Diagnosis not present

## 2023-03-29 DIAGNOSIS — F411 Generalized anxiety disorder: Secondary | ICD-10-CM | POA: Diagnosis not present

## 2023-04-12 ENCOUNTER — Other Ambulatory Visit: Payer: Self-pay | Admitting: Psychiatry

## 2023-04-12 DIAGNOSIS — F422 Mixed obsessional thoughts and acts: Secondary | ICD-10-CM

## 2023-04-12 DIAGNOSIS — F331 Major depressive disorder, recurrent, moderate: Secondary | ICD-10-CM

## 2023-04-26 ENCOUNTER — Other Ambulatory Visit: Payer: Self-pay | Admitting: Psychiatry

## 2023-04-26 DIAGNOSIS — G4752 REM sleep behavior disorder: Secondary | ICD-10-CM

## 2023-04-26 DIAGNOSIS — H6122 Impacted cerumen, left ear: Secondary | ICD-10-CM | POA: Diagnosis not present

## 2023-04-28 NOTE — Telephone Encounter (Signed)
Please call to schedule an appt. Due in March. Did not respond to MyChart message.

## 2023-04-30 NOTE — Telephone Encounter (Signed)
Found a different doctor who will prescribe it. Not returning

## 2023-05-02 DIAGNOSIS — H9193 Unspecified hearing loss, bilateral: Secondary | ICD-10-CM | POA: Diagnosis not present

## 2023-05-15 ENCOUNTER — Other Ambulatory Visit: Payer: Self-pay | Admitting: Internal Medicine

## 2023-05-17 ENCOUNTER — Ambulatory Visit (HOSPITAL_BASED_OUTPATIENT_CLINIC_OR_DEPARTMENT_OTHER)
Admission: RE | Admit: 2023-05-17 | Discharge: 2023-05-17 | Disposition: A | Discharge status: Home / Self Care | Payer: Medicare Other | Source: Ambulatory Visit | Attending: Family Medicine | Admitting: Family Medicine

## 2023-05-17 ENCOUNTER — Other Ambulatory Visit (HOSPITAL_COMMUNITY): Payer: Self-pay | Admitting: Family Medicine

## 2023-05-17 DIAGNOSIS — R319 Hematuria, unspecified: Secondary | ICD-10-CM

## 2023-05-17 DIAGNOSIS — R5383 Other fatigue: Secondary | ICD-10-CM | POA: Diagnosis not present

## 2023-05-17 DIAGNOSIS — N200 Calculus of kidney: Secondary | ICD-10-CM | POA: Diagnosis not present

## 2023-05-17 DIAGNOSIS — I700 Atherosclerosis of aorta: Secondary | ICD-10-CM | POA: Diagnosis not present

## 2023-05-17 DIAGNOSIS — G20A1 Parkinson's disease without dyskinesia, without mention of fluctuations: Secondary | ICD-10-CM | POA: Diagnosis not present

## 2023-05-17 LAB — POCT I-STAT CREATININE: Creatinine, Ser: 1.2 mg/dL (ref 0.61–1.24)

## 2023-05-17 MED ORDER — IOHEXOL 300 MG/ML  SOLN
100.0000 mL | Freq: Once | INTRAMUSCULAR | Status: AC | PRN
  Administered 2023-05-17: 100 mL via INTRAVENOUS

## 2023-05-18 ENCOUNTER — Other Ambulatory Visit: Payer: Self-pay | Admitting: Psychiatry

## 2023-05-18 DIAGNOSIS — F331 Major depressive disorder, recurrent, moderate: Secondary | ICD-10-CM

## 2023-05-18 DIAGNOSIS — F422 Mixed obsessional thoughts and acts: Secondary | ICD-10-CM

## 2023-05-19 NOTE — Telephone Encounter (Signed)
Please call to schedule an appt, past due.  

## 2023-05-20 DIAGNOSIS — K080 Exfoliation of teeth due to systemic causes: Secondary | ICD-10-CM | POA: Diagnosis not present

## 2023-05-20 NOTE — Telephone Encounter (Signed)
Getting medicine from neurologist and not coming back

## 2023-05-21 ENCOUNTER — Other Ambulatory Visit: Payer: Self-pay | Admitting: Internal Medicine

## 2023-05-21 DIAGNOSIS — K080 Exfoliation of teeth due to systemic causes: Secondary | ICD-10-CM | POA: Diagnosis not present

## 2023-05-23 DIAGNOSIS — R310 Gross hematuria: Secondary | ICD-10-CM | POA: Diagnosis not present

## 2023-05-23 DIAGNOSIS — N200 Calculus of kidney: Secondary | ICD-10-CM | POA: Diagnosis not present

## 2023-05-27 DIAGNOSIS — K080 Exfoliation of teeth due to systemic causes: Secondary | ICD-10-CM | POA: Diagnosis not present

## 2023-05-28 DIAGNOSIS — R5382 Chronic fatigue, unspecified: Secondary | ICD-10-CM | POA: Diagnosis not present

## 2023-05-30 DIAGNOSIS — Z6828 Body mass index (BMI) 28.0-28.9, adult: Secondary | ICD-10-CM | POA: Diagnosis not present

## 2023-05-30 DIAGNOSIS — M47816 Spondylosis without myelopathy or radiculopathy, lumbar region: Secondary | ICD-10-CM | POA: Diagnosis not present

## 2023-06-05 DIAGNOSIS — N200 Calculus of kidney: Secondary | ICD-10-CM | POA: Diagnosis not present

## 2023-06-05 DIAGNOSIS — N138 Other obstructive and reflux uropathy: Secondary | ICD-10-CM | POA: Diagnosis not present

## 2023-06-05 DIAGNOSIS — N361 Urethral diverticulum: Secondary | ICD-10-CM | POA: Diagnosis not present

## 2023-06-05 DIAGNOSIS — R310 Gross hematuria: Secondary | ICD-10-CM | POA: Diagnosis not present

## 2023-06-05 DIAGNOSIS — N35811 Other urethral stricture, male, meatal: Secondary | ICD-10-CM | POA: Diagnosis not present

## 2023-06-11 DIAGNOSIS — Z974 Presence of external hearing-aid: Secondary | ICD-10-CM | POA: Diagnosis not present

## 2023-06-11 DIAGNOSIS — H9193 Unspecified hearing loss, bilateral: Secondary | ICD-10-CM | POA: Diagnosis not present

## 2023-06-11 DIAGNOSIS — H903 Sensorineural hearing loss, bilateral: Secondary | ICD-10-CM | POA: Diagnosis not present

## 2023-06-11 DIAGNOSIS — G20A1 Parkinson's disease without dyskinesia, without mention of fluctuations: Secondary | ICD-10-CM | POA: Diagnosis not present

## 2023-06-11 DIAGNOSIS — Z461 Encounter for fitting and adjustment of hearing aid: Secondary | ICD-10-CM | POA: Diagnosis not present

## 2023-06-14 DIAGNOSIS — H903 Sensorineural hearing loss, bilateral: Secondary | ICD-10-CM | POA: Diagnosis not present

## 2023-06-26 ENCOUNTER — Other Ambulatory Visit: Payer: Self-pay | Admitting: Psychiatry

## 2023-06-26 DIAGNOSIS — F331 Major depressive disorder, recurrent, moderate: Secondary | ICD-10-CM

## 2023-06-26 DIAGNOSIS — K080 Exfoliation of teeth due to systemic causes: Secondary | ICD-10-CM | POA: Diagnosis not present

## 2023-06-26 DIAGNOSIS — F422 Mixed obsessional thoughts and acts: Secondary | ICD-10-CM

## 2023-07-12 DIAGNOSIS — H903 Sensorineural hearing loss, bilateral: Secondary | ICD-10-CM | POA: Diagnosis not present

## 2023-07-12 DIAGNOSIS — H938X3 Other specified disorders of ear, bilateral: Secondary | ICD-10-CM | POA: Diagnosis not present

## 2023-07-12 DIAGNOSIS — G20A1 Parkinson's disease without dyskinesia, without mention of fluctuations: Secondary | ICD-10-CM | POA: Diagnosis not present

## 2023-07-12 DIAGNOSIS — J310 Chronic rhinitis: Secondary | ICD-10-CM | POA: Diagnosis not present

## 2023-07-16 ENCOUNTER — Other Ambulatory Visit: Payer: Self-pay | Admitting: Psychiatry

## 2023-07-16 DIAGNOSIS — F331 Major depressive disorder, recurrent, moderate: Secondary | ICD-10-CM

## 2023-07-16 DIAGNOSIS — F422 Mixed obsessional thoughts and acts: Secondary | ICD-10-CM

## 2023-07-18 DIAGNOSIS — R0981 Nasal congestion: Secondary | ICD-10-CM | POA: Diagnosis not present

## 2023-07-18 DIAGNOSIS — R5383 Other fatigue: Secondary | ICD-10-CM | POA: Diagnosis not present

## 2023-07-18 DIAGNOSIS — U071 COVID-19: Secondary | ICD-10-CM | POA: Diagnosis not present

## 2023-07-25 ENCOUNTER — Other Ambulatory Visit: Payer: Self-pay | Admitting: Psychiatry

## 2023-07-25 DIAGNOSIS — F422 Mixed obsessional thoughts and acts: Secondary | ICD-10-CM

## 2023-07-25 DIAGNOSIS — F331 Major depressive disorder, recurrent, moderate: Secondary | ICD-10-CM

## 2023-08-27 ENCOUNTER — Ambulatory Visit: Payer: Medicare Other | Admitting: Speech Pathology

## 2023-08-27 ENCOUNTER — Ambulatory Visit: Payer: Medicare Other | Admitting: Occupational Therapy

## 2023-08-27 ENCOUNTER — Ambulatory Visit: Payer: Medicare Other | Admitting: Physical Therapy

## 2023-09-04 DIAGNOSIS — L570 Actinic keratosis: Secondary | ICD-10-CM | POA: Diagnosis not present

## 2023-09-04 DIAGNOSIS — L820 Inflamed seborrheic keratosis: Secondary | ICD-10-CM | POA: Diagnosis not present

## 2023-09-11 DIAGNOSIS — G4752 REM sleep behavior disorder: Secondary | ICD-10-CM | POA: Diagnosis not present

## 2023-09-11 DIAGNOSIS — G20A1 Parkinson's disease without dyskinesia, without mention of fluctuations: Secondary | ICD-10-CM | POA: Diagnosis not present

## 2023-09-22 ENCOUNTER — Other Ambulatory Visit: Payer: Self-pay | Admitting: Psychiatry

## 2023-09-22 DIAGNOSIS — F331 Major depressive disorder, recurrent, moderate: Secondary | ICD-10-CM

## 2023-09-22 DIAGNOSIS — F422 Mixed obsessional thoughts and acts: Secondary | ICD-10-CM

## 2023-09-23 NOTE — Telephone Encounter (Signed)
Telephone encounter from   05/20/23  8:58 AM Note Getting medicine from neurologist and not coming back      Okay to refuse medication?

## 2023-09-23 NOTE — Telephone Encounter (Signed)
Please schedule pt appt.  

## 2023-10-10 DIAGNOSIS — J342 Deviated nasal septum: Secondary | ICD-10-CM | POA: Diagnosis not present

## 2023-10-10 DIAGNOSIS — R0982 Postnasal drip: Secondary | ICD-10-CM | POA: Diagnosis not present

## 2023-10-10 DIAGNOSIS — J343 Hypertrophy of nasal turbinates: Secondary | ICD-10-CM | POA: Diagnosis not present

## 2023-10-10 DIAGNOSIS — J328 Other chronic sinusitis: Secondary | ICD-10-CM | POA: Diagnosis not present

## 2023-10-10 DIAGNOSIS — R0989 Other specified symptoms and signs involving the circulatory and respiratory systems: Secondary | ICD-10-CM | POA: Diagnosis not present

## 2023-11-04 DIAGNOSIS — K080 Exfoliation of teeth due to systemic causes: Secondary | ICD-10-CM | POA: Diagnosis not present

## 2023-11-07 DIAGNOSIS — L570 Actinic keratosis: Secondary | ICD-10-CM | POA: Diagnosis not present

## 2023-11-12 ENCOUNTER — Ambulatory Visit: Payer: Medicare Other | Admitting: Physical Therapy

## 2023-11-12 ENCOUNTER — Ambulatory Visit: Payer: Medicare Other | Attending: Family Medicine | Admitting: Occupational Therapy

## 2023-11-12 ENCOUNTER — Ambulatory Visit: Payer: Medicare Other

## 2023-11-12 DIAGNOSIS — R2689 Other abnormalities of gait and mobility: Secondary | ICD-10-CM | POA: Insufficient documentation

## 2023-11-12 DIAGNOSIS — R29818 Other symptoms and signs involving the nervous system: Secondary | ICD-10-CM | POA: Insufficient documentation

## 2023-11-12 DIAGNOSIS — R471 Dysarthria and anarthria: Secondary | ICD-10-CM | POA: Insufficient documentation

## 2023-11-12 DIAGNOSIS — R2681 Unsteadiness on feet: Secondary | ICD-10-CM | POA: Insufficient documentation

## 2023-11-12 NOTE — Therapy (Signed)
[facility] [address] [place], [place], [address] Phone: [phone]   Fax:  [phone]  Patient Details  Name: Sean Mendoza MRN: [id number] Date of Birth: [birth year] Referring Provider: Jaquita Folds, MD   Encounter Date: 11/12/2023  Speech Therapy Parkinson's Disease Screen   Decibel Level today: mid to upper 60s dB  (WNL=70-72 dB) with sound level meter 30cm away from pt's mouth. Pt's conversational volume has  declined since last screener. Reported increased nasal congestion and intermittent hoarseness since last screener. Endorsed wife is asking him to repeat himself but was not overly concerned about this.   Pt does not report difficulty with swallowing or cognition, which does not warrant further evaluation. Minimal drooling reported. Instructed use of intentional swallow.   While pt would benefit from speech-language eval and treatment for dysarthria/voice, pt declined ST intervention at this time.   Recommend repeat screen in 6-8 months. May prefer [facility] location closer to home.   Gracy Racer, CCC-SLP 11/12/2023, 10:20 AM  [facility] [address] [place], [place], [address] Phone: [phone]   Fax:  [phone]

## 2023-11-12 NOTE — Therapy (Signed)
[facility] [address] [place], [place], [address] Phone: [phone]   Fax:  [phone]  Patient Details  Name: Sean Mendoza MRN: [id number] Date of Birth: 1946-01-20 Referring Provider:  [name], MD  Encounter Date: 11/12/2023  PT End of Session - 11/12/23 1018     Visit Number 1    PT Start Time 1047    PT Stop Time 1055    PT Time Calculation (min) 8 min    Activity Tolerance Patient tolerated treatment well    Behavior During Therapy [facility] for tasks assessed/performed              Physical Therapy Parkinson's Disease Screen   Timed Up and Go test:6.12s no AD (previously 6.3s)   10 meter walk test:32.8' over 5.10s = 6.4 ft/s no AD (previously 4.24 ft/s)   5 time sit to stand test:7s no UE support (previously 6.4s)   Patient does not require Physical Therapy services at this time.  Recommend Physical Therapy screen in 1 year (per pt request). Pt reports he would prefer to be seen at [facility] due to closer location to his house, so did not reschedule screens this date.   Pt reports he pulled his L achilles about a week ago, so has not been lifting or using the elliptical in the past week but is slowly reintegrating into his elliptical and lifting for 1.5 hours 5 days per week. Denies falls.    Jill Alexanders Amadi Frady, PT, DPT 11/12/2023, 10:18 AM  [facility] [address] [place], [place], [address] Phone: [phone]   Fax:  [phone]

## 2023-11-12 NOTE — Therapy (Signed)
Occupational Therapy Parkinson's Disease Screen  Hand dominance:  RUE   Physical Performance Test item #2 (simulated eating):  11 sec  Physical Performance Test item #4 (donning/doffing jacket):  14  sec  9-hole peg test:    RUE  28 sec        LUE  34 sec  Box & Blocks Test:   RUE  54 blocks        LUE  56 blocks  Change in ability to perform ADLs/IADLs:  none reported  Other Comments:  He sprained his achilles 6-8 days ago. He is having to slowly get back to his typical gym workouts.   Pt does not require occupational therapy services at this time.  Objective measures are WNL for task and pt. Recommended occupational therapy screen in  6 months to a year.

## 2023-11-14 DIAGNOSIS — J324 Chronic pansinusitis: Secondary | ICD-10-CM | POA: Diagnosis not present

## 2023-11-14 DIAGNOSIS — J342 Deviated nasal septum: Secondary | ICD-10-CM | POA: Diagnosis not present

## 2023-11-14 DIAGNOSIS — R0982 Postnasal drip: Secondary | ICD-10-CM | POA: Diagnosis not present

## 2023-11-14 DIAGNOSIS — J343 Hypertrophy of nasal turbinates: Secondary | ICD-10-CM | POA: Diagnosis not present

## 2023-11-14 DIAGNOSIS — J328 Other chronic sinusitis: Secondary | ICD-10-CM | POA: Diagnosis not present

## 2023-11-21 DIAGNOSIS — S86012A Strain of left Achilles tendon, initial encounter: Secondary | ICD-10-CM | POA: Diagnosis not present

## 2023-11-21 DIAGNOSIS — M7662 Achilles tendinitis, left leg: Secondary | ICD-10-CM | POA: Diagnosis not present

## 2023-11-21 DIAGNOSIS — M79672 Pain in left foot: Secondary | ICD-10-CM | POA: Diagnosis not present

## 2023-11-22 ENCOUNTER — Telehealth: Payer: Self-pay | Admitting: Internal Medicine

## 2023-11-22 NOTE — Telephone Encounter (Signed)
Patient was calling to talk with Dr. Graciela Husbands or nurse

## 2023-11-22 NOTE — Telephone Encounter (Signed)
Pt reports 2 nights ago he woke with a muscle cramp between his left elbow and wrist that lasted about 5 seconds.  Pt states no CP, SOB, dizziness, nausea, vomiting or diaphoresis at time of forearm cramp.  Pt denies any current symptoms.  Pt with diagnosis of Parkinson's and follows with neurology. Pt advised symptoms of forearm cramp very atypical for a cardiac event but will forward to Dr Graciela Husbands for review.  Pt advised if symptoms increase to contact office and continue to follow with neurology.  Pt verbalizes understanding and agrees with current plan.

## 2023-11-25 DIAGNOSIS — M25571 Pain in right ankle and joints of right foot: Secondary | ICD-10-CM | POA: Diagnosis not present

## 2023-11-28 DIAGNOSIS — S86012A Strain of left Achilles tendon, initial encounter: Secondary | ICD-10-CM | POA: Diagnosis not present

## 2023-11-28 DIAGNOSIS — M7662 Achilles tendinitis, left leg: Secondary | ICD-10-CM | POA: Diagnosis not present

## 2023-11-29 NOTE — Telephone Encounter (Signed)
noted 

## 2023-12-09 DIAGNOSIS — M7021 Olecranon bursitis, right elbow: Secondary | ICD-10-CM | POA: Diagnosis not present

## 2023-12-10 ENCOUNTER — Encounter (HOSPITAL_BASED_OUTPATIENT_CLINIC_OR_DEPARTMENT_OTHER): Payer: Self-pay | Admitting: Physical Therapy

## 2023-12-10 ENCOUNTER — Ambulatory Visit (HOSPITAL_BASED_OUTPATIENT_CLINIC_OR_DEPARTMENT_OTHER): Payer: Medicare Other | Attending: Orthopaedic Surgery | Admitting: Physical Therapy

## 2023-12-10 DIAGNOSIS — M25572 Pain in left ankle and joints of left foot: Secondary | ICD-10-CM | POA: Diagnosis not present

## 2023-12-10 DIAGNOSIS — R2689 Other abnormalities of gait and mobility: Secondary | ICD-10-CM | POA: Insufficient documentation

## 2023-12-10 NOTE — Therapy (Signed)
 OUTPATIENT PHYSICAL THERAPY LOWER EXTREMITY EVALUATION   Patient Name: Sean Mendoza MRN: [id number] DOB:[birth year], 78 y.o., male Today's Date: 12/10/2023  END OF SESSION:  PT End of Session - 12/10/23 1333     Visit Number 1    Number of Visits 6    Date for PT Re-Evaluation 01/21/24    PT Start Time 0930    PT Stop Time 1012    PT Time Calculation (min) 42 min    Activity Tolerance Patient tolerated treatment well    Behavior During Therapy [facility] for tasks assessed/performed             Past Medical History:  Diagnosis Date   Amaurosis fugax    Anxiety    Cough    wert on set 09/2010   HTN (hypertension)    Migraine    PVC's (premature ventricular contractions)    Past Surgical History:  Procedure Laterality Date   CERVICAL DISCECTOMY     x 2   ELBOW SURGERY Left    x 3   HERNIA REPAIR     inguinal x 2   Patient Active Problem List   Diagnosis Date Noted   PVC's (premature ventricular contractions) 03/15/2023   Orthostatic lightheadedness 12/31/2020   Sleep apnea 03/19/2011   Hypertension 03/19/2011   Right bundle branch block 03/19/2011   COUGH 11/02/2010   DYSLIPIDEMIA 06/15/2010   Palpitations 06/15/2010    PCP: [facility]   REFERRING PROVIDER: Lillia Mountain MD  REFERRING DIAG:  Diagnosis  M76.62 (ICD-10-CM) - Achilles tendinitis, left leg    THERAPY DIAG:  Pain in left ankle and joints of left foot  Other abnormalities of gait and mobility  Rationale for Evaluation and Treatment: Rehabilitation  ONSET DATE: 2 months   SUBJECTIVE:   SUBJECTIVE STATEMENT: Patient had an acute onset left Achilles pain beginning about 2 months ago.  Remembers the pain coming on after working out.  Per patient MRI showed bone spurs behind his Achilles Achilles fraying.  He is set to have Achilles repair done on 01/15/2024.  He has increased pain with standing and walking.  He has stairs in his house which cause increased pain.  PERTINENT  HISTORY: Cervical discectomy x 2 left elbow surgery x 3 inguinal hernia repair x 2, PVCs, anxiety PAIN:  Are you having pain? Yes: NPRS scale: Can be up to 5 out of 10 Pain location: Mid Achilles Pain description: Aching Aggravating factors: Standing walking stairs Relieving factors: Rest  PRECAUTIONS: None  RED FLAGS: None   WEIGHT BEARING RESTRICTIONS: No  FALLS:  Has patient fallen in last 6 months? No  LIVING ENVIRONMENT:  OCCUPATION:  Retieed  Hobbies:   Golf  working out  PLOF: Independent  PATIENT GOALS:  To have less pain   NEXT MD VISIT:  Not until surgery   OBJECTIVE:  Note: Objective measures were completed at Evaluation unless otherwise noted.  DIAGNOSTIC FINDINGS:  At [facility]   PATIENT SURVEYS:  Prehab visit  COGNITION: Overall cognitive status: Within functional limits for tasks assessed     SENSATION: SDeinies parathesias  EDEMA:   Notices swelling in ankle with activty   POSTURE: Good  PALPATION: Tenderness to palpation approximately 2 inches above Achilles insertion  LOWER EXTREMITY ROM:  Passive ROM Right eval Left eval  Hip flexion    Hip extension    Hip abduction    Hip adduction    Hip internal rotation    Hip external rotation    Knee  flexion    Knee extension    Ankle dorsiflexion 7 0  Ankle plantarflexion  wnl  Ankle inversion  wnl  Ankle eversion  wnl   (Blank rows = not tested)  LOWER EXTREMITY MMT:  MMT Right eval Left eval  Hip flexion 37.5 37.3  Hip extension    Hip abduction 53.9 46.4  Hip adduction    Hip internal rotation    Hip external rotation    Knee flexion    Knee extension 47.1 49.0  Ankle dorsiflexion    Ankle plantarflexion    Ankle inversion    Ankle eversion     (Blank rows = not tested)   GAIT: No significant gait abnormalities                                                                                                                               TREATMENT DATE:    Access Code: [email or web address] URL: [email or web address] Date: 12/10/2023 Prepared by: Alm Don  Exercises - Seated Ankle Eversion with Resistance  - 1 x daily - 7 x weekly - 3 sets - 10 reps - Ankle inversion with Resistance  - 1 x daily - 7 x weekly - 3 sets - 12 reps - Supine Straight Leg Raises  - 1 x daily - 7 x weekly - 3 sets - 12 reps - Sidelying Hip Abduction  - 1 x daily - 7 x weekly - 3 sets - 12 reps - Seated Knee Extension with Resistance  - 1 x daily - 7 x weekly - 3 sets - 12 reps  Patient advised if ankle exercises cause pain to hold as well as the stretch   Exercise equipment  ( reviewed which ones to continue doing at the gym )  Exercise bike  Knee extension  Hip abduction ( going out)    Less important   Hip adduction  ( Going in)  Hamstring curl   PATIENT EDUCATION:  Education details: HEP, symptom management  Person educated: Patient Education method: Explanation, Demonstration, Tactile cues, Verbal cues, and Handouts Education comprehension: verbalized understanding, returned demonstration, verbal cues required, tactile cues required, and needs further education  HOME EXERCISE PROGRAM: Access Code: [facility] URL: [email or web address] Date: 12/10/2023 Prepared by: Alm Don  Exercises - Seated Ankle Eversion with Resistance  - 1 x daily - 7 x weekly - 3 sets - 10 reps - Ankle Dorsiflexion with Resistance  - 1 x daily - 7 x weekly - 3 sets - 12 reps - Supine Straight Leg Raises  - 1 x daily - 7 x weekly - 3 sets - 12 reps - Sidelying Hip Abduction  - 1 x daily - 7 x weekly - 3 sets - 12 reps - Seated Knee Extension with Resistance  - 1 x daily - 7 x weekly - 3 sets - 12 reps  ASSESSMENT:  CLINICAL IMPRESSION: Patient is a 78 year old male with acute onset of left Achilles pain.  He is scheduled to have surgery on 01/15/2024 to repair his Achilles and debris to bone spur.  He is limited active and passive dorsiflexion.   He has tenderness to palpation in the mid Achilles.  He has no significant strength deficits in his legs.  Ankles not tested today secondary to upcoming surgery.  Patient would benefit from skilled therapy to maximize strength and mobility prior to surgery.  He goes to the gym 3 to 4 days a week and is an avid golfer..   OBJECTIVE IMPAIRMENTS: Abnormal gait, decreased activity tolerance, difficulty walking, decreased ROM, decreased strength, and pain.   ACTIVITY LIMITATIONS: standing, squatting, stairs, and locomotion level  PARTICIPATION LIMITATIONS: meal prep, cleaning, shopping, community activity, and gynmaactivity   PERSONAL FACTORS: 1 comorbidity: Anxiety  are also affecting patient's functional outcome.   REHAB POTENTIAL: Excellent  CLINICAL DECISION MAKING: Stable/uncomplicated  EVALUATION COMPLEXITY: Low   GOALS: Goals reviewed with patient? Yes  SHORT TERM GOALS: Target date: 01/07/2024   Patient will abate gait base HEP Baseline: Goal status: INITIAL  2.  Patient will demonstrate 5 degrees of left dorsiflexion Baseline:  Goal status: INITIAL  3.  Patient's left hip abduction will equal right Baseline:  Goal status: INITIAL  Goal status: INITIAL  LONG TERM GOALS: Target date: 02/04/2024    Patient will up and down stairs without pain Baseline:  Goal status: INITIAL  2.  Patient will maximize strength prior to surgery Baseline:  Goal status: INITIAL    PLAN:  PT FREQUENCY: 1-2x/week  PT DURATION: 6 weeks  PLANNED INTERVENTIONS: 97110-Therapeutic exercises, 97530- Therapeutic activity, [id number]- Neuromuscular re-education, 97535- Self Care, [id number]- Manual therapy, [id number]- Gait training, [phone]- Aquatic Therapy, 97014- Electrical stimulation (unattended), 97035- Ultrasound, Patient/Family education, Stair training, Taping, Dry Needling, DME instructions, Cryotherapy, and Moist heat   PLAN FOR NEXT SESSION:  Review current HEP.  Add in single-leg stance.   Consider stepping on Airex.  Consider beginning stair training if tolerated.  Work on strengthening the patient has sufficiently as possible without irritating the surgical site.   Alm JINNY Don, PT 12/10/2023, 1:39 PM

## 2023-12-12 ENCOUNTER — Other Ambulatory Visit: Payer: Self-pay | Admitting: Internal Medicine

## 2023-12-16 ENCOUNTER — Encounter (HOSPITAL_BASED_OUTPATIENT_CLINIC_OR_DEPARTMENT_OTHER): Payer: Self-pay

## 2023-12-16 ENCOUNTER — Ambulatory Visit (HOSPITAL_BASED_OUTPATIENT_CLINIC_OR_DEPARTMENT_OTHER): Payer: Medicare Other | Admitting: Physical Therapy

## 2023-12-23 ENCOUNTER — Ambulatory Visit (HOSPITAL_BASED_OUTPATIENT_CLINIC_OR_DEPARTMENT_OTHER): Payer: Medicare Other | Admitting: Physical Therapy

## 2023-12-27 DIAGNOSIS — H5203 Hypermetropia, bilateral: Secondary | ICD-10-CM | POA: Diagnosis not present

## 2024-01-09 ENCOUNTER — Encounter (HOSPITAL_BASED_OUTPATIENT_CLINIC_OR_DEPARTMENT_OTHER): Payer: Self-pay | Admitting: Orthopaedic Surgery

## 2024-01-09 ENCOUNTER — Other Ambulatory Visit: Payer: Self-pay

## 2024-01-12 NOTE — H&P (Signed)
 ORTHOPAEDIC SURGERY H&P  Subjective:  The patient presents with left achilles tendinitid.   Past Medical History:  Diagnosis Date   Amaurosis fugax    Anxiety    Cough    wert on set 09/2010   HTN (hypertension)    Migraine    PVC's (premature ventricular contractions)     Past Surgical History:  Procedure Laterality Date   CERVICAL DISCECTOMY     x 2   ELBOW SURGERY Left    x 3   HERNIA REPAIR     inguinal x 2     (Not in an outpatient encounter)    Allergies  Allergen Reactions   Ampicillin     REACTION: itching   Promethazine Hcl Other (See Comments)   Sulfonamide Derivatives     unknown    Social History   Socioeconomic History   Marital status: Married    Spouse name: Not on file   Number of children: Not on file   Years of education: Not on file   Highest education level: Not on file  Occupational History   Not on file  Tobacco Use   Smoking status: Former   Smokeless tobacco: Never   Tobacco comments:    quit 30 years ago  Vaping Use   Vaping status: Never Used  Substance and Sexual Activity   Alcohol use: Yes    Comment: 2 glasses wine/week   Drug use: No   Sexual activity: Not on file  Other Topics Concern   Not on file  Social History Narrative   Married with 4 children   VP sales   Former smoker.  Quit in 1979.  Smoked for approx 5 yrs. Smoked up to 2 ppd   ETOH- wine every other day   Travels constantly   Social Drivers of Health   Financial Resource Strain: Low Risk  (12/08/2022)   Received from [facility], [facility]   Overall Financial Resource Strain (CARDIA)    Difficulty of Paying Living Expenses: Not hard at all  Food Insecurity: Low Risk  (07/12/2023)   Received from [facility]   Hunger Vital Sign    Worried About Running Out of Food in the Last Year: Never true    Ran Out of Food in the Last Year: Never true  Transportation Needs: Not on file (07/12/2023)  Physical Activity: Sufficiently Active (12/08/2022)    Received from [facility], [facility]   Exercise Vital Sign    Days of Exercise per Week: 5 days    Minutes of Exercise per Session: 50 min  Stress: No Stress Concern Present (12/08/2022)   Received from [facility], [facility]   [employer] of Occupational Health - Occupational Stress Questionnaire    Feeling of Stress : Not at all  Social Connections: Socially Integrated (12/08/2022)   Received from [facility], [facility]   Social Network    How would you rate your social network (family, work, friends)?: Good participation with social networks  Intimate Partner Violence: Not At Risk (12/08/2022)   Received from [facility], [facility]   HITS    Over the last 12 months how often did your partner physically hurt you?: Never    Over the last 12 months how often did your partner insult you or talk down to you?: Rarely    Over the last 12 months how often did your partner threaten you with physical harm?: Never    Over the last 12 months how often  did your partner scream or curse at you?: Never     History reviewed. No pertinent family history.   Review of Systems Pertinent items are noted in HPI.  Objective: Vital signs in last 24 hours:    01/09/2024    8:56 AM 03/15/2023    2:15 PM 03/13/2022    2:30 PM  Vitals with BMI  Height 6' 0 6' 0 6' 0  Weight 203 lbs 211 lbs 10 oz 204 lbs  BMI 27.53 28.69 27.66  Systolic  110 123  Diastolic  78 84  Pulse  77 67      EXAM: General: Well nourished, well developed. Awake, alert and oriented to time, place, person. Normal mood and affect. No apparent distress. Breathing room air.  Operative Lower Extremity: Alignment - Neutral Deformity - None Skin intact Tenderness to palpation - left achilles mid substance 5/5 TA, PT, GS, Per, EHL, FHL Sensation intact to light touch throughout Palpable DP and PT pulses Special testing: None  The contralateral foot/ankle was examined for comparison and noted to be  neurovascularly intact with no localized deformity, swelling, or tenderness.  Imaging Review All images taken were independently reviewed by me.  Assessment/Plan: The clinical and radiographic findings were reviewed and discussed at length with the patient.  The patient has left achilles tendinosis.  We spoke at length about the natural course of these findings. We discussed nonoperative and operative treatment options in detail.  The risks and benefits were presented and reviewed. The risks due to tendon rupture, tendon transfer, need, suture/hardware failure/irritation, new/persistent/recurrent infection, stiffness, nerve/vessel/tendon injury, nonunion/malunion of any fracture, wound healing issues, allograft usage, development of arthritis, failure of this surgery, possibility of external fixation in certain situations, possibility of delayed definitive surgery, need for further surgery, prolonged wound care including further soft tissue coverage procedures, thromboembolic events, anesthesia/medical complications/events perioperatively and beyond, amputation, death among others were discussed. The patient acknowledged the explanation and agreed to proceed with the plan.  Sean Mendoza  Orthopaedic Surgery [facility]

## 2024-01-12 NOTE — Discharge Instructions (Addendum)
Sean Cedars, MD [facility]  Please read the following information regarding your care after surgery.  Medications  You only need a prescription for the narcotic pain medicine (ex. oxycodone, Percocet, Norco).  All of the other medicines listed below are available over the counter. ? Aleve 2 pills twice a day for the first 3 days after surgery. ? acetominophen (Tylenol) 650 mg every 4-6 hours as you need for minor to moderate pain ? oxycodone as prescribed for severe pain  ? To help prevent blood clots, take eliquis (2.5 mg) twice daily for at least 14 days after surgery.  You should also get up every hour while you are awake to move around.  Weight Bearing ? Do NOT bear any weight on the operated leg or foot. This means do NOT touch your surgical leg to the ground!  Cast / Splint / Dressing ? If you have a splint, do NOT remove this. Keep your splint, cast or dressing clean and dry.  Don't put anything (coat hanger, pencil, etc) down inside of it.  If it gets wet, call the office immediately to schedule an appointment for a cast change.  Swelling IMPORTANT: It is normal for you to have swelling where you had surgery. To reduce swelling and pain, keep at least 3 pillows under your leg so that your toes are above your nose and your heel is above the level of your hip.  It may be necessary to keep your foot or leg elevated for several weeks.  This is critical to helping your incisions heal and your pain to feel better.  Follow Up Call my office at [phone] when you are discharged from the hospital or surgery center to schedule an appointment to be seen 7-10 days after surgery.  Call my office at [phone] if you develop a fever >101.5 F, nausea, vomiting, bleeding from the surgical site or severe pain.    No Tylenol until 4:30pm

## 2024-01-14 DIAGNOSIS — M7021 Olecranon bursitis, right elbow: Secondary | ICD-10-CM | POA: Diagnosis not present

## 2024-01-14 NOTE — Anesthesia Preprocedure Evaluation (Signed)
Anesthesia Evaluation  Patient identified by MRN, date of birth, ID band Patient awake    Reviewed: Allergy & Precautions, NPO status , Patient's Chart, lab work & pertinent test results  History of Anesthesia Complications Negative for: history of anesthetic complications  Airway Mallampati: II  TM Distance: >3 FB Neck ROM: Full    Dental no notable dental hx.    Pulmonary sleep apnea , former smoker   Pulmonary exam normal        Cardiovascular hypertension, Pt. on medications Normal cardiovascular exam     Neuro/Psych   Anxiety     Parkinson's dx    GI/Hepatic negative GI ROS, Neg liver ROS,,,  Endo/Other  negative endocrine ROS    Renal/GU negative Renal ROS  negative genitourinary   Musculoskeletal Rupture of left Achilles tendon   Abdominal   Peds  Hematology negative hematology ROS (+)   Anesthesia Other Findings Day of surgery medications reviewed with patient.  Reproductive/Obstetrics                              Anesthesia Physical Anesthesia Plan  ASA: 2  Anesthesia Plan: General   Post-op Pain Management: Regional block* and Tylenol PO (pre-op)*   Induction: Intravenous  PONV Risk Score and Plan: 2 and Ondansetron, Dexamethasone and Treatment may vary due to age or medical condition  Airway Management Planned: Oral ETT  Additional Equipment: None  Intra-op Plan:   Post-operative Plan: Extubation in OR  Informed Consent: I have reviewed the patients History and Physical, chart, labs and discussed the procedure including the risks, benefits and alternatives for the proposed anesthesia with the patient or authorized representative who has indicated his/her understanding and acceptance.     Dental advisory given  Plan Discussed with: CRNA  Anesthesia Plan Comments:         Anesthesia Quick Evaluation

## 2024-01-15 ENCOUNTER — Ambulatory Visit (HOSPITAL_BASED_OUTPATIENT_CLINIC_OR_DEPARTMENT_OTHER): Payer: Medicare Other | Admitting: Anesthesiology

## 2024-01-15 ENCOUNTER — Ambulatory Visit (HOSPITAL_BASED_OUTPATIENT_CLINIC_OR_DEPARTMENT_OTHER): Payer: Self-pay | Admitting: Anesthesiology

## 2024-01-15 ENCOUNTER — Encounter (HOSPITAL_BASED_OUTPATIENT_CLINIC_OR_DEPARTMENT_OTHER): Payer: Self-pay | Admitting: Orthopaedic Surgery

## 2024-01-15 ENCOUNTER — Other Ambulatory Visit: Payer: Self-pay

## 2024-01-15 ENCOUNTER — Encounter (HOSPITAL_BASED_OUTPATIENT_CLINIC_OR_DEPARTMENT_OTHER)
Admission: RE | Disposition: A | Discharge status: Home / Self Care | Payer: Self-pay | Source: Home / Self Care | Attending: Orthopaedic Surgery

## 2024-01-15 ENCOUNTER — Ambulatory Visit (HOSPITAL_BASED_OUTPATIENT_CLINIC_OR_DEPARTMENT_OTHER)
Admission: RE | Admit: 2024-01-15 | Discharge: 2024-01-15 | Disposition: A | Discharge status: Home / Self Care | Payer: Medicare Other | Attending: Orthopaedic Surgery | Admitting: Orthopaedic Surgery

## 2024-01-15 DIAGNOSIS — Z87891 Personal history of nicotine dependence: Secondary | ICD-10-CM | POA: Diagnosis not present

## 2024-01-15 DIAGNOSIS — G4733 Obstructive sleep apnea (adult) (pediatric): Secondary | ICD-10-CM | POA: Diagnosis not present

## 2024-01-15 DIAGNOSIS — X58XXXA Exposure to other specified factors, initial encounter: Secondary | ICD-10-CM | POA: Diagnosis not present

## 2024-01-15 DIAGNOSIS — G4730 Sleep apnea, unspecified: Secondary | ICD-10-CM | POA: Diagnosis not present

## 2024-01-15 DIAGNOSIS — I10 Essential (primary) hypertension: Secondary | ICD-10-CM | POA: Insufficient documentation

## 2024-01-15 DIAGNOSIS — S86012A Strain of left Achilles tendon, initial encounter: Secondary | ICD-10-CM | POA: Diagnosis not present

## 2024-01-15 DIAGNOSIS — G20A1 Parkinson's disease without dyskinesia, without mention of fluctuations: Secondary | ICD-10-CM | POA: Diagnosis not present

## 2024-01-15 DIAGNOSIS — M7662 Achilles tendinitis, left leg: Secondary | ICD-10-CM | POA: Insufficient documentation

## 2024-01-15 DIAGNOSIS — S86002A Unspecified injury of left Achilles tendon, initial encounter: Secondary | ICD-10-CM | POA: Diagnosis not present

## 2024-01-15 DIAGNOSIS — M66872 Spontaneous rupture of other tendons, left ankle and foot: Secondary | ICD-10-CM | POA: Diagnosis not present

## 2024-01-15 DIAGNOSIS — Z01818 Encounter for other preprocedural examination: Secondary | ICD-10-CM

## 2024-01-15 HISTORY — PX: ACHILLES TENDON SURGERY: SHX542

## 2024-01-15 SURGERY — REPAIR, TENDON, ACHILLES
Anesthesia: General | Site: Leg Lower | Laterality: Left

## 2024-01-15 MED ORDER — LIDOCAINE 2% (20 MG/ML) 5 ML SYRINGE
INTRAMUSCULAR | Status: AC
  Filled 2024-01-15: qty 5

## 2024-01-15 MED ORDER — KETOROLAC TROMETHAMINE 30 MG/ML IJ SOLN
INTRAMUSCULAR | Status: AC
  Filled 2024-01-15: qty 1

## 2024-01-15 MED ORDER — LACTATED RINGERS IV SOLN: INTRAVENOUS | Status: DC

## 2024-01-15 MED ORDER — DEXAMETHASONE SODIUM PHOSPHATE 10 MG/ML IJ SOLN
INTRAMUSCULAR | Status: AC
  Filled 2024-01-15: qty 1

## 2024-01-15 MED ORDER — ONDANSETRON HCL 4 MG/2ML IJ SOLN
INTRAMUSCULAR | Status: DC | PRN
  Administered 2024-01-15: 4 mg via INTRAVENOUS

## 2024-01-15 MED ORDER — CHLORHEXIDINE GLUCONATE 4 % EX SOLN: 60.0000 mL | Freq: Once | CUTANEOUS | Status: DC

## 2024-01-15 MED ORDER — DEXAMETHASONE SODIUM PHOSPHATE 10 MG/ML IJ SOLN
INTRAMUSCULAR | Status: DC | PRN
  Administered 2024-01-15: 5 mg via INTRAVENOUS

## 2024-01-15 MED ORDER — POVIDONE-IODINE 10 % EX SOLN
CUTANEOUS | Status: DC | PRN
  Administered 2024-01-15: 1 via TOPICAL

## 2024-01-15 MED ORDER — VANCOMYCIN HCL 500 MG IV SOLR
INTRAVENOUS | Status: DC | PRN
  Administered 2024-01-15: 500 mg via TOPICAL

## 2024-01-15 MED ORDER — FENTANYL CITRATE (PF) 100 MCG/2ML IJ SOLN
INTRAMUSCULAR | Status: AC
Start: 2024-01-15 — End: ?
  Filled 2024-01-15: qty 2

## 2024-01-15 MED ORDER — EPHEDRINE 5 MG/ML INJ
INTRAVENOUS | Status: AC
Start: 2024-01-15 — End: ?
  Filled 2024-01-15: qty 5

## 2024-01-15 MED ORDER — PROPOFOL 10 MG/ML IV BOLUS
INTRAVENOUS | Status: DC | PRN
  Administered 2024-01-15: 140 mg via INTRAVENOUS

## 2024-01-15 MED ORDER — OXYCODONE HCL 5 MG PO TABS: 5.0000 mg | ORAL_TABLET | Freq: Once | ORAL | Status: DC | PRN

## 2024-01-15 MED ORDER — FENTANYL CITRATE (PF) 100 MCG/2ML IJ SOLN: 25.0000 ug | INTRAMUSCULAR | Status: DC | PRN

## 2024-01-15 MED ORDER — ONDANSETRON HCL 4 MG/2ML IJ SOLN
INTRAMUSCULAR | Status: AC
  Filled 2024-01-15: qty 2

## 2024-01-15 MED ORDER — FENTANYL CITRATE (PF) 100 MCG/2ML IJ SOLN
INTRAMUSCULAR | Status: DC | PRN
  Administered 2024-01-15: 100 ug via INTRAVENOUS

## 2024-01-15 MED ORDER — ONDANSETRON HCL 4 MG/2ML IJ SOLN: 4.0000 mg | Freq: Once | INTRAMUSCULAR | Status: DC | PRN

## 2024-01-15 MED ORDER — BUPIVACAINE-EPINEPHRINE 0.5% -1:200000 IJ SOLN
INTRAMUSCULAR | Status: DC | PRN
  Administered 2024-01-15: 10 mL

## 2024-01-15 MED ORDER — MIDAZOLAM HCL 2 MG/2ML IJ SOLN
INTRAMUSCULAR | Status: AC
  Filled 2024-01-15: qty 2

## 2024-01-15 MED ORDER — ROCURONIUM BROMIDE 10 MG/ML (PF) SYRINGE
PREFILLED_SYRINGE | INTRAVENOUS | Status: AC
  Filled 2024-01-15: qty 10

## 2024-01-15 MED ORDER — 0.9 % SODIUM CHLORIDE (POUR BTL) OPTIME
TOPICAL | Status: DC | PRN
  Administered 2024-01-15: 200 mL

## 2024-01-15 MED ORDER — ACETAMINOPHEN 500 MG PO TABS
1000.0000 mg | ORAL_TABLET | Freq: Once | ORAL | Status: AC
  Administered 2024-01-15: 1000 mg via ORAL

## 2024-01-15 MED ORDER — ROCURONIUM BROMIDE 100 MG/10ML IV SOLN
INTRAVENOUS | Status: DC | PRN
  Administered 2024-01-15: 60 mg via INTRAVENOUS

## 2024-01-15 MED ORDER — CEFAZOLIN SODIUM-DEXTROSE 2-4 GM/100ML-% IV SOLN
2.0000 g | INTRAVENOUS | Status: AC
  Administered 2024-01-15: 2 g via INTRAVENOUS

## 2024-01-15 MED ORDER — OXYCODONE HCL 5 MG/5ML PO SOLN: 5.0000 mg | Freq: Once | ORAL | Status: DC | PRN

## 2024-01-15 MED ORDER — LIDOCAINE 2% (20 MG/ML) 5 ML SYRINGE
INTRAMUSCULAR | Status: DC | PRN
  Administered 2024-01-15: 100 mg via INTRAVENOUS

## 2024-01-15 MED ORDER — FENTANYL CITRATE (PF) 100 MCG/2ML IJ SOLN
INTRAMUSCULAR | Status: AC
  Filled 2024-01-15: qty 2

## 2024-01-15 MED ORDER — EPHEDRINE SULFATE (PRESSORS) 50 MG/ML IJ SOLN
INTRAMUSCULAR | Status: DC | PRN
  Administered 2024-01-15 (×2): 10 mg via INTRAVENOUS

## 2024-01-15 MED ORDER — ACETAMINOPHEN 500 MG PO TABS
ORAL_TABLET | ORAL | Status: AC
  Filled 2024-01-15: qty 2

## 2024-01-15 MED ORDER — CEFAZOLIN SODIUM-DEXTROSE 2-4 GM/100ML-% IV SOLN
INTRAVENOUS | Status: AC
  Filled 2024-01-15: qty 100

## 2024-01-15 MED ORDER — SUGAMMADEX SODIUM 200 MG/2ML IV SOLN
INTRAVENOUS | Status: DC | PRN
  Administered 2024-01-15: 200 mg via INTRAVENOUS

## 2024-01-15 SURGICAL SUPPLY — 55 items
BANDAGE ESMARK 6X9 LF (GAUZE/BANDAGES/DRESSINGS) IMPLANT
BLADE AVERAGE 25X9 (BLADE) IMPLANT
BLADE MICRO SAGITTAL (BLADE) IMPLANT
BLADE SURG 15 STRL LF DISP TIS (BLADE) ×4 IMPLANT
BNDG ELASTIC 6X10 VLCR STRL LF (GAUZE/BANDAGES/DRESSINGS) ×2 IMPLANT
BNDG ESMARK 6X9 LF (GAUZE/BANDAGES/DRESSINGS)
BNDG GAUZE DERMACEA FLUFF 4 (GAUZE/BANDAGES/DRESSINGS) ×2 IMPLANT
CANISTER SUCT 1200ML W/VALVE (MISCELLANEOUS) ×2 IMPLANT
CHLORAPREP W/TINT 26 (MISCELLANEOUS) ×2 IMPLANT
COVER BACK TABLE 60X90IN (DRAPES) ×2 IMPLANT
CUFF TRNQT CYL 34X4.125X (TOURNIQUET CUFF) ×2 IMPLANT
DRAPE EXTREMITY T 121X128X90 (DISPOSABLE) ×2 IMPLANT
DRAPE IMP U-DRAPE 54X76 (DRAPES) ×2 IMPLANT
DRAPE OEC MINIVIEW 54X84 (DRAPES) IMPLANT
DRAPE U-SHAPE 47X51 STRL (DRAPES) ×2 IMPLANT
DRSG MEPITEL 4X7.2 (GAUZE/BANDAGES/DRESSINGS) ×2 IMPLANT
ELECT REM PT RETURN 9FT ADLT (ELECTROSURGICAL) ×1
ELECTRODE REM PT RTRN 9FT ADLT (ELECTROSURGICAL) ×2 IMPLANT
GAUZE PAD ABD 8X10 STRL (GAUZE/BANDAGES/DRESSINGS) ×10 IMPLANT
GAUZE SPONGE 4X4 12PLY STRL (GAUZE/BANDAGES/DRESSINGS) ×2 IMPLANT
GLOVE BIOGEL PI IND STRL 8 (GLOVE) ×2 IMPLANT
GLOVE SURG SS PI 7.5 STRL IVOR (GLOVE) ×4 IMPLANT
GOWN STRL REUS W/ TWL LRG LVL3 (GOWN DISPOSABLE) ×4 IMPLANT
NDL HYPO 25X1 1.5 SAFETY (NEEDLE) IMPLANT
NDL SUT 6 .5 CRC .975X.05 MAYO (NEEDLE) IMPLANT
NEEDLE HYPO 25X1 1.5 SAFETY (NEEDLE) ×1 IMPLANT
PACK BASIN DAY SURGERY FS (CUSTOM PROCEDURE TRAY) ×2 IMPLANT
PAD CAST 4YDX4 CTTN HI CHSV (CAST SUPPLIES) ×2 IMPLANT
PADDING CAST ABS COTTON 4X4 ST (CAST SUPPLIES) IMPLANT
PADDING CAST SYNTHETIC 4X4 STR (CAST SUPPLIES) ×6 IMPLANT
PADDING CAST SYNTHETIC 6X4 NS (CAST SUPPLIES) ×6 IMPLANT
PENCIL SMOKE EVACUATOR (MISCELLANEOUS) ×2 IMPLANT
SANITIZER HAND PURELL FF 515ML (MISCELLANEOUS) ×2 IMPLANT
SHEET MEDIUM DRAPE 40X70 STRL (DRAPES) ×2 IMPLANT
SLEEVE SCD COMPRESS KNEE MED (STOCKING) ×2 IMPLANT
SPLINT FIBERGLASS 4X30 (CAST SUPPLIES) ×2 IMPLANT
SPONGE T-LAP 18X18 ~~LOC~~+RFID (SPONGE) ×2 IMPLANT
STOCKINETTE 6 STRL (DRAPES) ×2 IMPLANT
SUCTION TUBE FRAZIER 10FR DISP (SUCTIONS) IMPLANT
SUT ETHILON 2 0 FS 18 (SUTURE) IMPLANT
SUT FIBERWIRE #2 38 T-5 BLUE (SUTURE)
SUT MNCRL AB 3-0 PS2 18 (SUTURE) IMPLANT
SUT VIC AB 0 CT1 27XBRD ANBCTR (SUTURE) ×2 IMPLANT
SUT VIC AB 1 CT1 27XBRD ANBCTR (SUTURE) IMPLANT
SUT VIC AB 2-0 CT1 TAPERPNT 27 (SUTURE) IMPLANT
SUT VIC AB 3-0 SH 27X BRD (SUTURE) ×2 IMPLANT
SUTURE FIBERWR #2 38 T-5 BLUE (SUTURE) IMPLANT
SUTURE TAPE 1.3 FIBERLOP 20 ST (SUTURE) IMPLANT
SUTURETAPE 1.3 FIBERLOOP 20 ST (SUTURE)
SYR BULB EAR ULCER 3OZ GRN STR (SYRINGE) ×2 IMPLANT
SYR CONTROL 10ML LL (SYRINGE) IMPLANT
TOWEL GREEN STERILE FF (TOWEL DISPOSABLE) ×4 IMPLANT
TUBE CONNECTING 20X1/4 (TUBING) IMPLANT
UNDERPAD 30X36 HEAVY ABSORB (UNDERPADS AND DIAPERS) ×2 IMPLANT
YANKAUER SUCT BULB TIP NO VENT (SUCTIONS) IMPLANT

## 2024-01-15 NOTE — Anesthesia Postprocedure Evaluation (Signed)
Anesthesia Post Note  Patient: Sean Mendoza  Procedure(s) Performed: LEFT ACHILLES TENDON REPAIR WITH DEBRIDEMENT (Left: Leg Lower)     Patient location during evaluation: PACU Anesthesia Type: General Level of consciousness: awake and alert Pain management: pain level controlled Vital Signs Assessment: post-procedure vital signs reviewed and stable Respiratory status: spontaneous breathing, nonlabored ventilation and respiratory function stable Cardiovascular status: blood pressure returned to baseline Postop Assessment: no apparent nausea or vomiting Anesthetic complications: no   No notable events documented.  Last Vitals:  Vitals:   01/15/24 1145 01/15/24 1200  BP: 133/74 135/75  Pulse: (!) 50 (!) 51  Resp: 12 11  Temp:    SpO2: 98% 94%    Last Pain:  Vitals:   01/15/24 1200  TempSrc:   PainSc: 0-No pain                 Shanda Howells

## 2024-01-15 NOTE — Anesthesia Procedure Notes (Signed)
Procedure Name: Intubation Date/Time: 01/15/2024 10:49 AM  Performed by: Yolanda Bonine, CRNAPre-anesthesia Checklist: Patient identified, Emergency Drugs available, Suction available, Patient being monitored and Timeout performed Patient Re-evaluated:Patient Re-evaluated prior to induction Oxygen Delivery Method: Circle system utilized Preoxygenation: Pre-oxygenation with 100% oxygen Induction Type: IV induction Ventilation: Mask ventilation without difficulty Laryngoscope Size: Mac and 3 Grade View: Grade I Tube size: 7.0 mm Number of attempts: 1 Placement Confirmation: ETT inserted through vocal cords under direct vision, positive ETCO2 and breath sounds checked- equal and bilateral Secured at: 22 cm Tube secured with: Tape Dental Injury: Teeth and Oropharynx as per pre-operative assessment

## 2024-01-15 NOTE — Op Note (Signed)
01/15/2024  11:56 AM   PATIENT: GAY RAPE  78 y.o. 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   INDICATION FOR PROCEDURE: The patient presented with left Achilles pain over a year with acute worsening recently. MRI identified chronic tendinosis of the left Achilles midsubstance with an intrasubstance tear. The patient was tender to direct palpation exactly in this spot despite several months of nonoperative treatment including exercise programs and boot immobilization.  We discussed the diagnosis, alternative treatment options, risks and benefits of the above surgical intervention, as well as alternative non-operative treatments. All questions/concerns were addressed and the patient/family demonstrated appropriate understanding of the diagnosis, the procedure, the postoperative course, and overall prognosis. The patient wished to proceed with surgical intervention and signed an informed surgical consent as such, in each others presence prior to surgery.   PROCEDURE IN DETAIL: After preoperative consent was obtained and the correct operative site was identified, the patient was brought to the operating room supine on stretcher and transferred onto operating table. General anesthesia was induced. Preoperative antibiotics were administered. Surgical timeout was taken. The operative lower extremity was prepped and draped in standard sterile fashion with a tourniquet around the thigh. The patient  was then positioned prone with appropriate padding. The extremity was exsanguinated and the tourniquet was inflated to 275 mmHg.   A longitudinal incision was made over the midsubstance region of the Achilles.  Dissection was carried sharply down through the subcutaneous tissues and peritenon creating full-thickness flaps medially and laterally.  The Achilles tendon rupture was identified.  Serosanguinous fluid was debrided.  Degenerative tendon portions were sharply excised. At least 80% of the native tendon was noted to be intact. Vicryl suture was used to repair the Achilles rupture with excellent approximation.  The Lenora test was noted to be normal at this point.  A 3-0 Monocryl running circumferential stitch was placed around the tendon to augment the repair.   The surgical sites were thoroughly irrigated. The tourniquet was deflated and hemostasis achieved. The subcutaneous tissues were closed using 3-0 monocryl. The skin was closed without tension using 2-0 nylon suture.    The leg was cleaned with saline and sterile xeroform dressings with gauze were applied. A well padded bulky short leg splint was applied. The patient was flipped supine, awakened from anesthesia and transported to the recovery room in stable condition.    FOLLOW UP PLAN: -transfer to PACU, then home -strict NWB operative extremity, maximum elevation -maintain short leg splint until follow up -DVT ppx: Eliquis 2.5 mg twice daily while NWB (cannot tolerate aspirin) -follow up as outpatient within 7-10 days for wound check with exchange of short leg splint to short leg cast -sutures out in 2-3 weeks in outpatient office   RADIOGRAPHS: None   Netta Cedars Orthopaedic Surgery [facility]

## 2024-01-15 NOTE — Transfer of Care (Signed)
Immediate Anesthesia Transfer of Care Note  Patient: Sean Mendoza  Procedure(s) Performed: LEFT ACHILLES TENDON REPAIR WITH DEBRIDEMENT (Left: Leg Lower)  Patient Location: PACU  Anesthesia Type:General  Level of Consciousness: awake, alert , and oriented  Airway & Oxygen Therapy: Patient Spontanous Breathing and Patient connected to face mask oxygen  Post-op Assessment: Report given to RN and Post -op Vital signs reviewed and stable  Post vital signs: Reviewed and stable  Last Vitals:  Vitals Value Taken Time  BP 137/79 01/15/24 1139  Temp 36.2 C 01/15/24 1139  Pulse 51 01/15/24 1141  Resp 12 01/15/24 1141  SpO2 98 % 01/15/24 1141  Vitals shown include unfiled device data.  Last Pain:  Vitals:   01/15/24 1014  TempSrc: Temporal  PainSc: 0-No pain         Complications: No notable events documented.

## 2024-01-15 NOTE — H&P (Signed)
H&P Update:  -History and Physical Reviewed  -Patient has been re-examined  -No change in the plan of care  -The risks and benefits were presented and reviewed. The risks due to tendon transfer, tendon rupture, hardware/suture failure and/or irritation, new/persistent infection, stiffness, nerve/vessel/tendon injury or rerupture of repaired tendon, nonunion/malunion, allograft usage, wound healing issues, development of arthritis, failure of this surgery, possibility of external fixation with delayed definitive surgery, need for further surgery, thromboembolic events, anesthesia/medical complications, amputation, death among others were discussed. The patient acknowledged the explanation, agreed to proceed with the plan and a consent was signed.  Sean Mendoza

## 2024-01-16 ENCOUNTER — Encounter (HOSPITAL_BASED_OUTPATIENT_CLINIC_OR_DEPARTMENT_OTHER): Payer: Self-pay | Admitting: Orthopaedic Surgery

## 2024-01-22 DIAGNOSIS — G20B1 Parkinson's disease with dyskinesia, without mention of fluctuations: Secondary | ICD-10-CM | POA: Diagnosis not present

## 2024-01-22 DIAGNOSIS — F411 Generalized anxiety disorder: Secondary | ICD-10-CM | POA: Diagnosis not present

## 2024-01-22 DIAGNOSIS — G4700 Insomnia, unspecified: Secondary | ICD-10-CM | POA: Diagnosis not present

## 2024-01-22 DIAGNOSIS — E785 Hyperlipidemia, unspecified: Secondary | ICD-10-CM | POA: Diagnosis not present

## 2024-01-23 DIAGNOSIS — M25551 Pain in right hip: Secondary | ICD-10-CM | POA: Diagnosis not present

## 2024-01-23 DIAGNOSIS — S86012A Strain of left Achilles tendon, initial encounter: Secondary | ICD-10-CM | POA: Diagnosis not present

## 2024-02-06 DIAGNOSIS — S86012D Strain of left Achilles tendon, subsequent encounter: Secondary | ICD-10-CM | POA: Diagnosis not present

## 2024-02-22 NOTE — Therapy (Signed)
 OUTPATIENT PHYSICAL THERAPY LOWER EXTREMITY EVALUATION   Patient Name: Sean Mendoza MRN: [id number] DOB:[birth year], 78 y.o., male Today's Date: 02/29/2024  END OF SESSION:  PT End of Session - 02/29/24 1228     Visit Number 1    Number of Visits 24    Date for PT Re-Evaluation 04/25/24    Authorization Type BLUE CROSS [employer]UE [employer] MEDICARE    PT Start Time 1002    PT Stop Time 1045    PT Time Calculation (min) 43 min    Equipment Utilized During Treatment Other (comment)    Activity Tolerance Patient tolerated treatment well    Behavior During Therapy WFL for tasks assessed/performed             Past Medical History:  Diagnosis Date   Amaurosis fugax    Anxiety    Cough    wert on set 09/2010   HTN (hypertension)    Migraine    PVC's (premature ventricular contractions)    Past Surgical History:  Procedure Laterality Date   ACHILLES TENDON SURGERY Left 01/15/2024   Procedure: LEFT ACHILLES TENDON REPAIR WITH DEBRIDEMENT;  Surgeon: Netta Cedars, MD;  Location: [facility];  Service: Orthopedics;  Laterality: Left;   CERVICAL DISCECTOMY     x 2   ELBOW SURGERY Left    x 3   HERNIA REPAIR     inguinal x 2   Patient Active Problem List   Diagnosis Date Noted   PVC's (premature ventricular contractions) 03/15/2023   Orthostatic lightheadedness 12/31/2020   Sleep apnea 03/19/2011   Hypertension 03/19/2011   Right bundle branch block 03/19/2011   COUGH 11/02/2010   DYSLIPIDEMIA 06/15/2010   Palpitations 06/15/2010    PCP: [name], MD  REFERRING PROVIDER: Netta Cedars, MD  REFERRING DIAG: Strain of left Achilles tendon, initial encounter [U72.536U]   THERAPY DIAG:  Pain in left ankle and joints of left foot  Other abnormalities of gait and mobility  Unsteadiness on feet  Muscle weakness (generalized)  Localized edema  Rationale for Evaluation and Treatment: Rehabilitation  ONSET DATE: 01/15/2024  SUBJECTIVE:    SUBJECTIVE STATEMENT: Pt reports states that he was bench pressing 400lbs when he thinks he may have ruptured his achilles tendon on his L LE. He is unable to pin point the exact moment. He states that he received surgery about 6 weeks ago and has been recovering well. Saw MD on Tuesday who states he can come out of his boot and WBAT. Pt denies any significant pain. He is determined to get back to golf.   PERTINENT HISTORY: Anxiety, HTN, PVC's, parkinson's  PAIN:  Are you having pain? Yes: NPRS scale: 0/10 Pain location: L achilles tendon Pain description: None at this time.  Aggravating factors: None  Relieving factors: Aleve   PRECAUTIONS: Other: WBAT without boot.   RED FLAGS: None   WEIGHT BEARING RESTRICTIONS: Yes WBAT without boot   FALLS:  Has patient fallen in last 6 months? No  LIVING ENVIRONMENT: Lives with: lives with their spouse Lives in: House/apartment Stairs: Yes: Internal: 14 steps; on right going up and External: 5 steps; bilateral but cannot reach both Has following equipment at home: Dan Humphreys - 4 wheeled, shower chair, and Scooter  OCCUPATION: Retired   PLOF: Independent  PATIENT GOALS: Pt would like to get back to golfing.   NEXT MD VISIT: 2 months.   OBJECTIVE:  Note: Objective measures were completed at Evaluation unless otherwise noted.  DIAGNOSTIC FINDINGS: None  recent in Chart.   PATIENT SURVEYS:  LEFS 51/80  COGNITION: Overall cognitive status: Within functional limits for tasks assessed     SENSATION: WFL  EDEMA:  Figure 8: 58cm on R, 59cm on L    POSTURE: No Significant postural limitations  PALPATION: No tenderness, scar tissue and edema present   LOWER EXTREMITY ROM:  Active ROM Right eval Left eval  Hip flexion    Hip extension    Hip abduction    Hip adduction    Hip internal rotation    Hip external rotation    Knee flexion    Knee extension    Ankle dorsiflexion 5 Lacking 4 degrees  Ankle plantarflexion 40  42  Ankle inversion 20 22  Ankle eversion 25 25   (Blank rows = not tested)  LOWER EXTREMITY MMT:  MMT Right eval Left eval  Hip flexion    Hip extension    Hip abduction    Hip adduction    Hip internal rotation    Hip external rotation    Knee flexion    Knee extension    Ankle dorsiflexion    Ankle plantarflexion    Ankle inversion    Ankle eversion     (Blank rows = not tested)   FUNCTIONAL TESTS:  None today   GAIT: Distance walked: 23ft  Assistive device utilized: None Level of assistance: Complete Independence Comments: Pt with decreased heel to toe gait pattern.                                                                                                                                 TREATMENT DATE: Creating, reviewing, and completing below HEP  - Discussed signs and symptoms of overuse - Walking with shoe and how to re- dress wound - DF/PF - SLR - S/L abduction    PATIENT EDUCATION:  Education details: Educated pt on anatomy and physiology of current symptoms, FOTO, diagnosis, prognosis, HEP,  and POC. Person educated: Patient Education method: Medical illustrator Education comprehension: verbalized understanding and returned demonstration  HOME EXERCISE PROGRAM: Access Code: [email or web address] URL: [email or web address] Date: 02/29/2024 Prepared by: Royal Hawthorn  Exercises - Sidelying Hip Abduction  - 1-2 x daily - 7 x weekly - 3 sets - 10 reps - Active Straight Leg Raise with Quad Set  - 1-2 x daily - 7 x weekly - 3 sets - 10 reps - Seated Long Arc Quad  - 1-2 x daily - 7 x weekly - 3 sets - 10 reps - Supine Ankle Inversion Eversion AROM  - 1-2 x daily - 7 x weekly - 3 sets - 10 reps - Supine Ankle Dorsiflexion and Plantarflexion AROM  - 1-2 x daily - 7 x weekly - 3 sets - 10 reps   ASSESSMENT:  CLINICAL IMPRESSION: Patient referred to PT for s/p L achillis repair. No signs or symptoms of infection. Mild edema noted.  Re-dressed wound. He  is currently wearing a boot, notes pain in his R hip and lower back due to compensatory pattern. Pt denies any pain with gentle AROM today. Encouraged walking in normal shoe to tolerance with focus on heel to toe gait pattern. Discussed signs and symptoms of overdoing movements or exercises. Pt is very eager to get back to gym and golf. Patient will benefit from skilled PT to address below impairments, limitations and improve overall function.  OBJECTIVE IMPAIRMENTS: decreased activity tolerance, difficulty walking, decreased balance, decreased endurance, decreased mobility, decreased ROM, decreased strength, impaired flexibility, impaired UE/LE use, postural dysfunction, and pain.  ACTIVITY LIMITATIONS: bending, lifting, carry, locomotion, cleaning, community activity, driving, and or occupation  PERSONAL FACTORS: Anxiety, HTN, PVC's, parkinson's  are also affecting patient's functional outcome.  REHAB POTENTIAL: Good  CLINICAL DECISION MAKING: Stable/uncomplicated  EVALUATION COMPLEXITY: Low    GOALS: Short term PT Goals Target date: 03/21/2024 Pt will be I and compliant with HEP. Baseline:  Goal status: New Pt will be able to walk without boot for 50% of the day without pain.   Long term PT goals Target date: 04/25/2024 Pt will improve ROM to [facility] to improve functional mobility Baseline: Goal status: New Pt will improve hip/knee strength to at least 5-/5 MMT to improve functional strength Baseline: Goal status: New Pt will improve LEFS to at least 9 points to to show improved function per MCID Baseline: Goal status: New Pt will reduce pain by overall 50% overall with usual activity Baseline: Goal status: New Pt will return to golf and participate in other recreational activities without complaints of pain or limitations.  Baseline: Goal status: New Pt will be able to ambulate community distances at least 1000 ft WNL gait pattern without  complaints Baseline: Goal status: New  PLAN: PT FREQUENCY: 1-2 times per week   PT DURATION: 8-12 weeks  PLANNED INTERVENTIONS (unless contraindicated): aquatic PT, Canalith repositioning, cryotherapy, Electrical stimulation, Iontophoresis with 4 mg/ml dexamethasome, Moist heat, traction, Ultrasound, gait training, Therapeutic exercise, balance training, neuromuscular re-education, patient/family education, prosthetic training, manual techniques, passive ROM, dry needling, taping, vasopnuematic device, vestibular, spinal manipulations, joint manipulations  PLAN FOR NEXT SESSION: Progress per protocol.      Champ Mungo, PT 02/29/2024, 12:32 PM

## 2024-02-27 ENCOUNTER — Telehealth (HOSPITAL_BASED_OUTPATIENT_CLINIC_OR_DEPARTMENT_OTHER): Payer: Self-pay | Admitting: Physical Therapy

## 2024-02-27 ENCOUNTER — Other Ambulatory Visit: Payer: Self-pay | Admitting: Internal Medicine

## 2024-02-27 NOTE — Telephone Encounter (Signed)
 Called and spoke to patient to remind patient of upcoming physical therapy evaluation appointment. Pt confirmed appt and will be in attendance.

## 2024-02-29 ENCOUNTER — Other Ambulatory Visit: Payer: Self-pay

## 2024-02-29 ENCOUNTER — Encounter (HOSPITAL_BASED_OUTPATIENT_CLINIC_OR_DEPARTMENT_OTHER): Payer: Self-pay | Admitting: Physical Therapy

## 2024-02-29 ENCOUNTER — Ambulatory Visit (HOSPITAL_BASED_OUTPATIENT_CLINIC_OR_DEPARTMENT_OTHER): Attending: Orthopaedic Surgery | Admitting: Physical Therapy

## 2024-02-29 DIAGNOSIS — R600 Localized edema: Secondary | ICD-10-CM | POA: Insufficient documentation

## 2024-02-29 DIAGNOSIS — M6281 Muscle weakness (generalized): Secondary | ICD-10-CM | POA: Insufficient documentation

## 2024-02-29 DIAGNOSIS — M25572 Pain in left ankle and joints of left foot: Secondary | ICD-10-CM | POA: Diagnosis not present

## 2024-02-29 DIAGNOSIS — R2689 Other abnormalities of gait and mobility: Secondary | ICD-10-CM | POA: Diagnosis not present

## 2024-02-29 DIAGNOSIS — R2681 Unsteadiness on feet: Secondary | ICD-10-CM | POA: Insufficient documentation

## 2024-03-03 ENCOUNTER — Encounter (HOSPITAL_BASED_OUTPATIENT_CLINIC_OR_DEPARTMENT_OTHER): Payer: Self-pay | Admitting: Physical Therapy

## 2024-03-03 ENCOUNTER — Ambulatory Visit (HOSPITAL_BASED_OUTPATIENT_CLINIC_OR_DEPARTMENT_OTHER): Attending: Orthopaedic Surgery | Admitting: Physical Therapy

## 2024-03-03 DIAGNOSIS — R2689 Other abnormalities of gait and mobility: Secondary | ICD-10-CM | POA: Diagnosis not present

## 2024-03-03 DIAGNOSIS — R278 Other lack of coordination: Secondary | ICD-10-CM | POA: Diagnosis not present

## 2024-03-03 DIAGNOSIS — R2681 Unsteadiness on feet: Secondary | ICD-10-CM | POA: Insufficient documentation

## 2024-03-03 DIAGNOSIS — R600 Localized edema: Secondary | ICD-10-CM | POA: Diagnosis not present

## 2024-03-03 DIAGNOSIS — M6281 Muscle weakness (generalized): Secondary | ICD-10-CM | POA: Insufficient documentation

## 2024-03-03 DIAGNOSIS — M25572 Pain in left ankle and joints of left foot: Secondary | ICD-10-CM | POA: Diagnosis not present

## 2024-03-03 DIAGNOSIS — R471 Dysarthria and anarthria: Secondary | ICD-10-CM | POA: Insufficient documentation

## 2024-03-03 DIAGNOSIS — Z1212 Encounter for screening for malignant neoplasm of rectum: Secondary | ICD-10-CM | POA: Diagnosis not present

## 2024-03-03 DIAGNOSIS — Z1211 Encounter for screening for malignant neoplasm of colon: Secondary | ICD-10-CM | POA: Diagnosis not present

## 2024-03-03 DIAGNOSIS — R29818 Other symptoms and signs involving the nervous system: Secondary | ICD-10-CM | POA: Insufficient documentation

## 2024-03-03 NOTE — Therapy (Signed)
 OUTPATIENT PHYSICAL THERAPY LOWER EXTREMITYTREATMENT   Patient Name: Sean Mendoza MRN: [id number] DOB:[birth year], 78 y.o., male Today's Date: 03/04/2024  END OF SESSION:  PT End of Session - 03/03/24 1327     Visit Number 2    Number of Visits 24    Date for PT Re-Evaluation 04/25/24    Authorization Type BLUE CROSS BLUE [employer]IELD MEDICARE    PT Start Time 1323    PT Stop Time 1404    PT Time Calculation (min) 41 min    Activity Tolerance Patient tolerated treatment well    Behavior During Therapy WFL for tasks assessed/performed              Past Medical History:  Diagnosis Date   Amaurosis fugax    Anxiety    Cough    wert on set 09/2010   HTN (hypertension)    Migraine    PVC's (premature ventricular contractions)    Past Surgical History:  Procedure Laterality Date   ACHILLES TENDON SURGERY Left 01/15/2024   Procedure: LEFT ACHILLES TENDON REPAIR WITH DEBRIDEMENT;  Surgeon: Netta Cedars, MD;  Location: [facility];  Service: Orthopedics;  Laterality: Left;   CERVICAL DISCECTOMY     x 2   ELBOW SURGERY Left    x 3   HERNIA REPAIR     inguinal x 2   Patient Active Problem List   Diagnosis Date Noted   PVC's (premature ventricular contractions) 03/15/2023   Orthostatic lightheadedness 12/31/2020   Sleep apnea 03/19/2011   Hypertension 03/19/2011   Right bundle branch block 03/19/2011   COUGH 11/02/2010   DYSLIPIDEMIA 06/15/2010   Palpitations 06/15/2010    PCP: [name], MD  REFERRING PROVIDER: Netta Cedars, MD  REFERRING DIAG: Strain of left Achilles tendon, initial encounter [Q65.784O]   THERAPY DIAG:  Pain in left ankle and joints of left foot  Other abnormalities of gait and mobility  Muscle weakness (generalized)  Localized edema  Rationale for Evaluation and Treatment: Rehabilitation  ONSET DATE: 01/15/2024  SUBJECTIVE:   SUBJECTIVE STATEMENT: Pt is 6 weeks and 6 days s/p Left Achilles tendon open  debridement with secondary repair.  Pt states he didn't receive a protocol from MD and states they were going to leave it up to PT.  Pt stopped using the boot on Sunday.  He report no adverse effects since removing the boot.  Pt denies any adverse effects after prior rx.  Pt states "I really don't have pain."  Pt reports compliance with HEP.  Pt states he is slow with mobility and is cautious.  Pt is unable to perform his normal workout activities.  Pt states he did yard work in [employer].    PERTINENT HISTORY: Left Achilles tendon open debridement with secondary repair on 01/15/2024 Anxiety, HTN, PVC's, parkinson's   PAIN:  Are you having pain? Yes: NPRS scale: 0/10 Pain location: L achilles tendon Pain description: None at this time.  Aggravating factors: None  Relieving factors: Aleve   PRECAUTIONS: Other: WBAT without boot.   RED FLAGS: None   WEIGHT BEARING RESTRICTIONS: Yes WBAT without boot   FALLS:  Has patient fallen in last 6 months? No  LIVING ENVIRONMENT: Lives with: lives with their spouse Lives in: House/apartment Stairs: Yes: Internal: 14 steps; on right going up and External: 5 steps; bilateral but cannot reach both Has following equipment at home: Dan Humphreys - 4 wheeled, shower chair, and Scooter  OCCUPATION: Retired   PLOF: Independent  PATIENT  GOALS: Pt would like to get back to golfing.   NEXT MD VISIT: 2 months.   OBJECTIVE:  Note: Objective measures were completed at Evaluation unless otherwise noted.  DIAGNOSTIC FINDINGS: None recent in Chart.    LOWER EXTREMITY ROM:  Active ROM Right eval Left eval Left 4/1  Hip flexion     Hip extension     Hip abduction     Hip adduction     Hip internal rotation     Hip external rotation     Knee flexion     Knee extension     Ankle dorsiflexion 5 Lacking 4 degrees Lacking 2 deg from neutral  Ankle plantarflexion 40 42   Ankle inversion 20 22   Ankle eversion 25 25    (Blank rows = not  tested)                                                                                                                                TREATMENT:  Reviewed current function, pain level, response to prior Rx, and HEP compliance.  OBSERVATION:  incision healing well.  Incision is dry and had no drainage.    Reviewed HEP.  Supine SLR 2x10 S/L hip abd 2x10 Seated LAQ x10 Ankle DF/PF w/n protocol range (below neutral) 2x10 Ankle eversion/inversion below neutral  2x10 Ankle circles w/n protocol range (below neutral) 2x10  seated calf raise 2x10  PT instructed pt in appropriate ROM per protocol with exercises  See below for pt education   PATIENT EDUCATION:  Education details:  PT instructed pt to not perform yard work.  Pt answered pt's questions.  Relevant anatomy, protocol limits and restrictions, appropriate ROM, diagnosis, prognosis, HEP,  and POC. Person educated: Patient Education method: Medical illustrator, verbal and tactile cues Education comprehension: verbalized understanding and returned demonstration, verbal and tactile cues required  HOME EXERCISE PROGRAM: Access Code: [email or web address] URL: [email or web address] Date: 02/29/2024 Prepared by: Royal Hawthorn  Exercises - Sidelying Hip Abduction  - 1-2 x daily - 7 x weekly - 3 sets - 10 reps - Active Straight Leg Raise with Quad Set  - 1-2 x daily - 7 x weekly - 3 sets - 10 reps - Seated Long Arc Quad  - 1-2 x daily - 7 x weekly - 3 sets - 10 reps - Supine Ankle Inversion Eversion AROM  - 1-2 x daily - 7 x weekly - 3 sets - 10 reps - Supine Ankle Dorsiflexion and Plantarflexion AROM  - 1-2 x daily - 7 x weekly - 3 sets - 10 reps   ASSESSMENT:  CLINICAL IMPRESSION:  Pt is 6 weeks and 6 days post op.  He is doing well at this time in protocol.  He has been ambulating without boot without adverse effects.  Pt did some yard work over the weekend and PT instructed him to not perform yard work.  PT  reviewed HEP and pt performed HEP.  PT educated pt in appropriate ROM per protocol.  He performed exercises per protocol w/n ROM parameters per protocol well with cuing and instruction in correct form.   Pt has improved L anke DF AROM.  PT answered pt's questions and pt demonstrates good understanding.  He responded well to Rx having no pain and no c/o's after Rx.  He should benefit from skilled PT per protocol to address impairments and goals and to improve overall function.   OBJECTIVE IMPAIRMENTS: decreased activity tolerance, difficulty walking, decreased balance, decreased endurance, decreased mobility, decreased ROM, decreased strength, impaired flexibility, impaired UE/LE use, postural dysfunction, and pain.  ACTIVITY LIMITATIONS: bending, lifting, carry, locomotion, cleaning, community activity, driving, and or occupation  PERSONAL FACTORS: Anxiety, HTN, PVC's, parkinson's  are also affecting patient's functional outcome.  REHAB POTENTIAL: Good  CLINICAL DECISION MAKING: Stable/uncomplicated  EVALUATION COMPLEXITY: Low    GOALS: Short term PT Goals Target date: 03/21/2024 Pt will be I and compliant with HEP. Baseline:  Goal status: New Pt will be able to walk without boot for 50% of the day without pain.   Long term PT goals Target date: 04/25/2024 Pt will improve ROM to [facility] to improve functional mobility Baseline: Goal status: New Pt will improve hip/knee strength to at least 5-/5 MMT to improve functional strength Baseline: Goal status: New Pt will improve LEFS to at least 9 points to to show improved function per MCID Baseline: Goal status: New Pt will reduce pain by overall 50% overall with usual activity Baseline: Goal status: New Pt will return to golf and participate in other recreational activities without complaints of pain or limitations.  Baseline: Goal status: New Pt will be able to ambulate community distances at least 1000 ft WNL gait pattern without  complaints Baseline: Goal status: New  PLAN: PT FREQUENCY: 1-2 times per week   PT DURATION: 8-12 weeks  PLANNED INTERVENTIONS (unless contraindicated): aquatic PT, Canalith repositioning, cryotherapy, Electrical stimulation, Iontophoresis with 4 mg/ml dexamethasome, Moist heat, traction, Ultrasound, gait training, Therapeutic exercise, balance training, neuromuscular re-education, patient/family education, prosthetic training, manual techniques, passive ROM, dry needling, taping, vasopnuematic device, vestibular, spinal manipulations, joint manipulations  PLAN FOR NEXT SESSION: Progress per achilles tendon repair protocol.      Audie Clear III PT, DPT 03/04/24 5:59 PM

## 2024-03-06 ENCOUNTER — Encounter (HOSPITAL_BASED_OUTPATIENT_CLINIC_OR_DEPARTMENT_OTHER): Payer: Self-pay

## 2024-03-06 ENCOUNTER — Ambulatory Visit (HOSPITAL_BASED_OUTPATIENT_CLINIC_OR_DEPARTMENT_OTHER)

## 2024-03-06 DIAGNOSIS — R2689 Other abnormalities of gait and mobility: Secondary | ICD-10-CM | POA: Diagnosis not present

## 2024-03-06 DIAGNOSIS — M25572 Pain in left ankle and joints of left foot: Secondary | ICD-10-CM | POA: Diagnosis not present

## 2024-03-06 DIAGNOSIS — R29818 Other symptoms and signs involving the nervous system: Secondary | ICD-10-CM | POA: Diagnosis not present

## 2024-03-06 DIAGNOSIS — R600 Localized edema: Secondary | ICD-10-CM | POA: Diagnosis not present

## 2024-03-06 DIAGNOSIS — R2681 Unsteadiness on feet: Secondary | ICD-10-CM

## 2024-03-06 DIAGNOSIS — M6281 Muscle weakness (generalized): Secondary | ICD-10-CM

## 2024-03-06 DIAGNOSIS — R278 Other lack of coordination: Secondary | ICD-10-CM | POA: Diagnosis not present

## 2024-03-06 DIAGNOSIS — R471 Dysarthria and anarthria: Secondary | ICD-10-CM | POA: Diagnosis not present

## 2024-03-06 NOTE — Therapy (Signed)
 OUTPATIENT PHYSICAL THERAPY LOWER EXTREMITYTREATMENT   Patient Name: Sean Mendoza MRN: [id number] DOB:21-Sep-1946, 78 y.o., male Today's Date: 03/06/2024  END OF SESSION:  PT End of Session - 03/06/24 0939     Visit Number 3    Number of Visits 24    Date for PT Re-Evaluation 04/25/24    Authorization Type [employer]    PT Start Time [phone]    PT Stop Time 1015    PT Time Calculation (min) 42 min    Activity Tolerance Patient tolerated treatment well    Behavior During Therapy WFL for tasks assessed/performed               Past Medical History:  Diagnosis Date   Amaurosis fugax    Anxiety    Cough    wert on set 09/2010   HTN (hypertension)    Migraine    PVC's (premature ventricular contractions)    Past Surgical History:  Procedure Laterality Date   ACHILLES TENDON SURGERY Left 01/15/2024   Procedure: LEFT ACHILLES TENDON REPAIR WITH DEBRIDEMENT;  Surgeon: Netta Cedars, MD;  Location: [facility];  Service: Orthopedics;  Laterality: Left;   CERVICAL DISCECTOMY     x 2   ELBOW SURGERY Left    x 3   HERNIA REPAIR     inguinal x 2   Patient Active Problem List   Diagnosis Date Noted   PVC's (premature ventricular contractions) 03/15/2023   Orthostatic lightheadedness 12/31/2020   Sleep apnea 03/19/2011   Hypertension 03/19/2011   Right bundle branch block 03/19/2011   COUGH 11/02/2010   DYSLIPIDEMIA 06/15/2010   Palpitations 06/15/2010    PCP: [name], MD  REFERRING PROVIDER: Netta Cedars, MD  REFERRING DIAG: Strain of left Achilles tendon, initial encounter [K44.010U]   THERAPY DIAG:  Pain in left ankle and joints of left foot  Other abnormalities of gait and mobility  Muscle weakness (generalized)  Localized edema  Unsteadiness on feet  Rationale for Evaluation and Treatment: Rehabilitation  ONSET DATE: 01/15/2024  SUBJECTIVE:   SUBJECTIVE STATEMENT: Pt is 6 weeks and 6 days s/p  Left Achilles tendon open debridement with secondary repair.  Pt states he didn't receive a protocol from MD and states they were going to leave it up to PT.  Pt stopped using the boot on Sunday.  He report no adverse effects since removing the boot.  Pt denies any adverse effects after prior rx.  Pt states "I really don't have pain."  Pt reports compliance with HEP.  Pt states he is slow with mobility and is cautious.  Pt is unable to perform his normal workout activities.  Pt states he did yard work in [employer].    PERTINENT HISTORY: Left Achilles tendon open debridement with secondary repair on 01/15/2024 Anxiety, HTN, PVC's, parkinson's   PAIN:  Are you having pain? Yes: NPRS scale: 0/10 Pain location: L achilles tendon Pain description: None at this time.  Aggravating factors: None  Relieving factors: Aleve   PRECAUTIONS: Other: WBAT without boot.   RED FLAGS: None   WEIGHT BEARING RESTRICTIONS: Yes WBAT without boot   FALLS:  Has patient fallen in last 6 months? No  LIVING ENVIRONMENT: Lives with: lives with their spouse Lives in: House/apartment Stairs: Yes: Internal: 14 steps; on right going up and External: 5 steps; bilateral but cannot reach both Has following equipment at home: Dan Humphreys - 4 wheeled, shower chair, and Scooter  OCCUPATION: Retired  PLOF: Independent  PATIENT GOALS: Pt would like to get back to golfing.   NEXT MD VISIT: 2 months.   OBJECTIVE:  Note: Objective measures were completed at Evaluation unless otherwise noted.  DIAGNOSTIC FINDINGS: None recent in Chart.    LOWER EXTREMITY ROM:  Active ROM Right eval Left eval Left 4/1  Hip flexion     Hip extension     Hip abduction     Hip adduction     Hip internal rotation     Hip external rotation     Knee flexion     Knee extension     Ankle dorsiflexion 5 Lacking 4 degrees Lacking 2 deg from neutral  Ankle plantarflexion 40 42   Ankle inversion 20 22   Ankle eversion 25 25     (Blank rows = not tested)                                                                                                                                TREATMENT:    Treatment                            03/06/24: Blank lines following charge title = not provided on this treatment date.   Manual:  TPDN No STM and scar mobilization to achilles area in prone IASTM using roller to gastroc/soleus  There-ex: Active inv/ev x20 Active DF/PF 2x10 (cues to avoid strong stretch) Seated towel scrunch x30 Seated HR low range 2x10  -Discussion of HEP, protocol, and precautions at this time    PATIENT EDUCATION:  Education details:  PT instructed pt to not perform yard work.  Pt answered pt's questions.  Relevant anatomy, protocol limits and restrictions, appropriate ROM, diagnosis, prognosis, HEP,  and POC. Person educated: Patient Education method: Medical illustrator, verbal and tactile cues Education comprehension: verbalized understanding and returned demonstration, verbal and tactile cues required  HOME EXERCISE PROGRAM: Access Code: [email or web address] URL: [email or web address] Date: 02/29/2024 Prepared by: Royal Hawthorn  Exercises - Sidelying Hip Abduction  - 1-2 x daily - 7 x weekly - 3 sets - 10 reps - Active Straight Leg Raise with Quad Set  - 1-2 x daily - 7 x weekly - 3 sets - 10 reps - Seated Long Arc Quad  - 1-2 x daily - 7 x weekly - 3 sets - 10 reps - Supine Ankle Inversion Eversion AROM  - 1-2 x daily - 7 x weekly - 3 sets - 10 reps - Supine Ankle Dorsiflexion and Plantarflexion AROM  - 1-2 x daily - 7 x weekly - 3 sets - 10 reps   ASSESSMENT:  CLINICAL IMPRESSION:  Worked on gentle scar massage to incision area to decrease scar tissue presence. Also performed IASTM using roller tool to decrease restrictions in L gastroc/soleus. Pt reported benefit from MT today. Incision area is fully closed and healing well. Pt has roller  at home he can use over calf  area for self IASTM.  Discussed protocol limitations with pt at this time with verbalized understanding. Will continue to monitor pain level and progress as tolerated.   OBJECTIVE IMPAIRMENTS: decreased activity tolerance, difficulty walking, decreased balance, decreased endurance, decreased mobility, decreased ROM, decreased strength, impaired flexibility, impaired UE/LE use, postural dysfunction, and pain.  ACTIVITY LIMITATIONS: bending, lifting, carry, locomotion, cleaning, community activity, driving, and or occupation  PERSONAL FACTORS: Anxiety, HTN, PVC's, parkinson's  are also affecting patient's functional outcome.  REHAB POTENTIAL: Good  CLINICAL DECISION MAKING: Stable/uncomplicated  EVALUATION COMPLEXITY: Low    GOALS: Short term PT Goals Target date: 03/21/2024 Pt will be I and compliant with HEP. Baseline:  Goal status: New Pt will be able to walk without boot for 50% of the day without pain.   Long term PT goals Target date: 04/25/2024 Pt will improve ROM to [facility] to improve functional mobility Baseline: Goal status: New Pt will improve hip/knee strength to at least 5-/5 MMT to improve functional strength Baseline: Goal status: New Pt will improve LEFS to at least 9 points to to show improved function per MCID Baseline: Goal status: New Pt will reduce pain by overall 50% overall with usual activity Baseline: Goal status: New Pt will return to golf and participate in other recreational activities without complaints of pain or limitations.  Baseline: Goal status: New Pt will be able to ambulate community distances at least 1000 ft WNL gait pattern without complaints Baseline: Goal status: New  PLAN: PT FREQUENCY: 1-2 times per week   PT DURATION: 8-12 weeks  PLANNED INTERVENTIONS (unless contraindicated): aquatic PT, Canalith repositioning, cryotherapy, Electrical stimulation, Iontophoresis with 4 mg/ml dexamethasome, Moist heat, traction, Ultrasound, gait  training, Therapeutic exercise, balance training, neuromuscular re-education, patient/family education, prosthetic training, manual techniques, passive ROM, dry needling, taping, vasopnuematic device, vestibular, spinal manipulations, joint manipulations  PLAN FOR NEXT SESSION: Progress per achilles tendon repair protocol.      Riki Altes, PTA  03/06/24 10:53 AM

## 2024-03-07 ENCOUNTER — Encounter (HOSPITAL_BASED_OUTPATIENT_CLINIC_OR_DEPARTMENT_OTHER): Admitting: Physical Therapy

## 2024-03-11 NOTE — Therapy (Signed)
 OUTPATIENT PHYSICAL THERAPY LOWER EXTREMITYTREATMENT   Patient Name: Sean Mendoza MRN: [id number] DOB:1946-08-25, 78 y.o., male Today's Date: 03/11/2024  END OF SESSION:      Past Medical History:  Diagnosis Date   Amaurosis fugax    Anxiety    Cough    wert on set 09/2010   HTN (hypertension)    Migraine    PVC's (premature ventricular contractions)    Past Surgical History:  Procedure Laterality Date   ACHILLES TENDON SURGERY Left 01/15/2024   Procedure: LEFT ACHILLES TENDON REPAIR WITH DEBRIDEMENT;  Surgeon: Netta Cedars, MD;  Location: [facility];  Service: Orthopedics;  Laterality: Left;   CERVICAL DISCECTOMY     x 2   ELBOW SURGERY Left    x 3   HERNIA REPAIR     inguinal x 2   Patient Active Problem List   Diagnosis Date Noted   PVC's (premature ventricular contractions) 03/15/2023   Orthostatic lightheadedness 12/31/2020   Sleep apnea 03/19/2011   Hypertension 03/19/2011   Right bundle branch block 03/19/2011   COUGH 11/02/2010   DYSLIPIDEMIA 06/15/2010   Palpitations 06/15/2010    PCP: [name], MD  REFERRING PROVIDER: Netta Cedars, MD  REFERRING DIAG: Strain of left Achilles tendon, initial encounter [W09.811B]   THERAPY DIAG:  No diagnosis found.  Rationale for Evaluation and Treatment: Rehabilitation  ONSET DATE: 01/15/2024  SUBJECTIVE:   SUBJECTIVE STATEMENT: Pt is 8 weeks and 1 day s/p Left Achilles tendon open debridement with secondary repair.  Pt states he didn't receive a protocol from MD and states they were going to leave it up to PT.  Pt stopped using the boot on Sunday.  He report no adverse effects since removing the boot.  Pt denies any adverse effects after prior rx.  Pt states "I really don't have pain."  Pt reports compliance with HEP.  Pt states he is slow with mobility and is cautious.  Pt is unable to perform his normal workout activities.  Pt states he did yard work in a [employer], systems.     PERTINENT HISTORY: Left Achilles tendon open debridement with secondary repair on 01/15/2024 Anxiety, HTN, PVC's, parkinson's   PAIN:  Are you having pain? Yes: NPRS scale: 0/10 Pain location: L achilles tendon Pain description: None at this time.  Aggravating factors: None  Relieving factors: Aleve   PRECAUTIONS: Other: WBAT without boot.   RED FLAGS: None   WEIGHT BEARING RESTRICTIONS: Yes WBAT without boot   FALLS:  Has patient fallen in last 6 months? No  LIVING ENVIRONMENT: Lives with: lives with their spouse Lives in: House/apartment Stairs: Yes: Internal: 14 steps; on right going up and External: 5 steps; bilateral but cannot reach both Has following equipment at home: Dan Humphreys - 4 wheeled, shower chair, and Scooter  OCCUPATION: Retired   PLOF: Independent  PATIENT GOALS: Pt would like to get back to golfing.   NEXT MD VISIT: 2 months.   OBJECTIVE:  Note: Objective measures were completed at Evaluation unless otherwise noted.  DIAGNOSTIC FINDINGS: None recent in Chart.    LOWER EXTREMITY ROM:  Active ROM Right eval Left eval Left 4/1  Hip flexion     Hip extension     Hip abduction     Hip adduction     Hip internal rotation     Hip external rotation     Knee flexion     Knee extension     Ankle dorsiflexion 5 Lacking 4 degrees Lacking  2 deg from neutral  Ankle plantarflexion 40 42   Ankle inversion 20 22   Ankle eversion 25 25    (Blank rows = not tested)                                                                                                                                TREATMENT:    Treatment                            03/06/24: Blank lines following charge title = not provided on this treatment date.   Manual:  TPDN No STM and scar mobilization to achilles area in prone IASTM using roller to gastroc/soleus  There-ex: Active inv/ev x20 Active DF/PF 2x10 (cues to avoid strong stretch) Seated towel scrunch x30 Seated  HR low range 2x10  -Discussion of HEP, protocol, and precautions at this time    PATIENT EDUCATION:  Education details:  PT instructed pt to not perform yard work.  Pt answered pt's questions.  Relevant anatomy, protocol limits and restrictions, appropriate ROM, diagnosis, prognosis, HEP,  and POC. Person educated: Patient Education method: Medical illustrator, verbal and tactile cues Education comprehension: verbalized understanding and returned demonstration, verbal and tactile cues required  HOME EXERCISE PROGRAM: Access Code: [email or web address] URL: [email or web address] Date: 02/29/2024 Prepared by: Royal Hawthorn  Exercises - Sidelying Hip Abduction  - 1-2 x daily - 7 x weekly - 3 sets - 10 reps - Active Straight Leg Raise with Quad Set  - 1-2 x daily - 7 x weekly - 3 sets - 10 reps - Seated Long Arc Quad  - 1-2 x daily - 7 x weekly - 3 sets - 10 reps - Supine Ankle Inversion Eversion AROM  - 1-2 x daily - 7 x weekly - 3 sets - 10 reps - Supine Ankle Dorsiflexion and Plantarflexion AROM  - 1-2 x daily - 7 x weekly - 3 sets - 10 reps   ASSESSMENT:  CLINICAL IMPRESSION:  Worked on gentle scar massage to incision area to decrease scar tissue presence. Also performed IASTM using roller tool to decrease restrictions in L gastroc/soleus. Pt reported benefit from MT today. Incision area is fully closed and healing well. Pt has roller at home he can use over calf area for self IASTM.  Discussed protocol limitations with pt at this time with verbalized understanding. Will continue to monitor pain level and progress as tolerated.   OBJECTIVE IMPAIRMENTS: decreased activity tolerance, difficulty walking, decreased balance, decreased endurance, decreased mobility, decreased ROM, decreased strength, impaired flexibility, impaired UE/LE use, postural dysfunction, and pain.  ACTIVITY LIMITATIONS: bending, lifting, carry, locomotion, cleaning, community activity, driving, and or  occupation  PERSONAL FACTORS: Anxiety, HTN, PVC's, parkinson's  are also affecting patient's functional outcome.  REHAB POTENTIAL: Good  CLINICAL DECISION MAKING: Stable/uncomplicated  EVALUATION COMPLEXITY: Low    GOALS: Short term PT Goals Target date: 03/21/2024  Pt will be I and compliant with HEP. Baseline:  Goal status: New Pt will be able to walk without boot for 50% of the day without pain.   Long term PT goals Target date: 04/25/2024 Pt will improve ROM to [facility] to improve functional mobility Baseline: Goal status: New Pt will improve hip/knee strength to at least 5-/5 MMT to improve functional strength Baseline: Goal status: New Pt will improve LEFS to at least 9 points to to show improved function per MCID Baseline: Goal status: New Pt will reduce pain by overall 50% overall with usual activity Baseline: Goal status: New Pt will return to golf and participate in other recreational activities without complaints of pain or limitations.  Baseline: Goal status: New Pt will be able to ambulate community distances at least 1000 ft WNL gait pattern without complaints Baseline: Goal status: New  PLAN: PT FREQUENCY: 1-2 times per week   PT DURATION: 8-12 weeks  PLANNED INTERVENTIONS (unless contraindicated): aquatic PT, Canalith repositioning, cryotherapy, Electrical stimulation, Iontophoresis with 4 mg/ml dexamethasome, Moist heat, traction, Ultrasound, gait training, Therapeutic exercise, balance training, neuromuscular re-education, patient/family education, prosthetic training, manual techniques, passive ROM, dry needling, taping, vasopnuematic device, vestibular, spinal manipulations, joint manipulations  PLAN FOR NEXT SESSION: Progress per achilles tendon repair protocol.      Riki Altes, PTA  03/11/24 10:05 PM

## 2024-03-12 ENCOUNTER — Ambulatory Visit (HOSPITAL_BASED_OUTPATIENT_CLINIC_OR_DEPARTMENT_OTHER): Admitting: Physical Therapy

## 2024-03-12 DIAGNOSIS — R600 Localized edema: Secondary | ICD-10-CM

## 2024-03-12 DIAGNOSIS — R2689 Other abnormalities of gait and mobility: Secondary | ICD-10-CM

## 2024-03-12 DIAGNOSIS — R278 Other lack of coordination: Secondary | ICD-10-CM | POA: Diagnosis not present

## 2024-03-12 DIAGNOSIS — M6281 Muscle weakness (generalized): Secondary | ICD-10-CM

## 2024-03-12 DIAGNOSIS — R471 Dysarthria and anarthria: Secondary | ICD-10-CM | POA: Diagnosis not present

## 2024-03-12 DIAGNOSIS — M25572 Pain in left ankle and joints of left foot: Secondary | ICD-10-CM

## 2024-03-12 DIAGNOSIS — R29818 Other symptoms and signs involving the nervous system: Secondary | ICD-10-CM | POA: Diagnosis not present

## 2024-03-12 DIAGNOSIS — R2681 Unsteadiness on feet: Secondary | ICD-10-CM | POA: Diagnosis not present

## 2024-03-13 ENCOUNTER — Encounter (HOSPITAL_BASED_OUTPATIENT_CLINIC_OR_DEPARTMENT_OTHER): Payer: Self-pay | Admitting: Physical Therapy

## 2024-03-18 DIAGNOSIS — I700 Atherosclerosis of aorta: Secondary | ICD-10-CM | POA: Diagnosis not present

## 2024-03-18 DIAGNOSIS — R399 Unspecified symptoms and signs involving the genitourinary system: Secondary | ICD-10-CM | POA: Diagnosis not present

## 2024-03-19 ENCOUNTER — Encounter (HOSPITAL_BASED_OUTPATIENT_CLINIC_OR_DEPARTMENT_OTHER): Payer: Self-pay | Admitting: Physical Therapy

## 2024-03-19 ENCOUNTER — Ambulatory Visit (HOSPITAL_BASED_OUTPATIENT_CLINIC_OR_DEPARTMENT_OTHER): Admitting: Physical Therapy

## 2024-03-19 DIAGNOSIS — M25572 Pain in left ankle and joints of left foot: Secondary | ICD-10-CM | POA: Diagnosis not present

## 2024-03-19 DIAGNOSIS — N201 Calculus of ureter: Secondary | ICD-10-CM | POA: Diagnosis not present

## 2024-03-19 DIAGNOSIS — M6281 Muscle weakness (generalized): Secondary | ICD-10-CM | POA: Diagnosis not present

## 2024-03-19 DIAGNOSIS — R2681 Unsteadiness on feet: Secondary | ICD-10-CM | POA: Diagnosis not present

## 2024-03-19 DIAGNOSIS — R471 Dysarthria and anarthria: Secondary | ICD-10-CM | POA: Diagnosis not present

## 2024-03-19 DIAGNOSIS — R278 Other lack of coordination: Secondary | ICD-10-CM | POA: Diagnosis not present

## 2024-03-19 DIAGNOSIS — R310 Gross hematuria: Secondary | ICD-10-CM | POA: Diagnosis not present

## 2024-03-19 DIAGNOSIS — N281 Cyst of kidney, acquired: Secondary | ICD-10-CM | POA: Diagnosis not present

## 2024-03-19 DIAGNOSIS — R600 Localized edema: Secondary | ICD-10-CM | POA: Diagnosis not present

## 2024-03-19 DIAGNOSIS — R29818 Other symptoms and signs involving the nervous system: Secondary | ICD-10-CM | POA: Diagnosis not present

## 2024-03-19 DIAGNOSIS — R109 Unspecified abdominal pain: Secondary | ICD-10-CM | POA: Diagnosis not present

## 2024-03-19 DIAGNOSIS — R2689 Other abnormalities of gait and mobility: Secondary | ICD-10-CM | POA: Diagnosis not present

## 2024-03-19 NOTE — Therapy (Signed)
 OUTPATIENT PHYSICAL THERAPY LOWER EXTREMITYTREATMENT   Patient Name: Sean Mendoza MRN: [id number] DOB:05/21/1946, 78 y.o., male Today's Date: 03/19/2024  END OF SESSION:  PT End of Session - 03/19/24 1324     Visit Number 5    Number of Visits 24    Date for PT Re-Evaluation 04/25/24    Authorization Type BLUE [employer]OSS [employer] MEDICARE    PT Start Time 1323    PT Stop Time 1409    PT Time Calculation (min) 46 min    Activity Tolerance Patient tolerated treatment well    Behavior During Therapy WFL for tasks assessed/performed                Past Medical History:  Diagnosis Date   Amaurosis fugax    Anxiety    Cough    wert on set 09/2010   HTN (hypertension)    Migraine    PVC's (premature ventricular contractions)    Past Surgical History:  Procedure Laterality Date   ACHILLES TENDON SURGERY Left 01/15/2024   Procedure: LEFT ACHILLES TENDON REPAIR WITH DEBRIDEMENT;  Surgeon: Ali Ink, MD;  Location: [facility];  Service: Orthopedics;  Laterality: Left;   CERVICAL DISCECTOMY     x 2   ELBOW SURGERY Left    x 3   HERNIA REPAIR     inguinal x 2   Patient Active Problem List   Diagnosis Date Noted   PVC's (premature ventricular contractions) 03/15/2023   Orthostatic lightheadedness 12/31/2020   Sleep apnea 03/19/2011   Hypertension 03/19/2011   Right bundle branch block 03/19/2011   COUGH 11/02/2010   DYSLIPIDEMIA 06/15/2010   Palpitations 06/15/2010    PCP: Ronna Coho, MD  REFERRING PROVIDER: Ali Ink, MD  REFERRING DIAG: Strain of left Achilles tendon, initial encounter [W09.811B]   THERAPY DIAG:  Pain in left ankle and joints of left foot  Other abnormalities of gait and mobility  Muscle weakness (generalized)  Rationale for Evaluation and Treatment: Rehabilitation  ONSET DATE: 01/15/2024  SUBJECTIVE:   SUBJECTIVE STATEMENT: Pt is 9 weeks and 1 day s/p Left Achilles tendon open debridement with  secondary repair.  Pt denies any adverse effects after prior Rx.  Pt denies pain currently.   Pt used the recumbent bike for 25 mins and felt good.  Pt states he is still taking 1 step at a time on the stairs.  Pt states he had to jog/run a couple of steps to grab his granddaughter who was in a motorized cart.  He states he felt a little pain though nothing bad and had no pain afterwards. Pt has a kidney stone and saw the urologist today.  He is going to have surgery to remove kidney stone.       PERTINENT HISTORY: Left Achilles tendon open debridement with secondary repair on 01/15/2024 Anxiety, HTN, PVC's, parkinson's   PAIN:  Are you having pain? Yes: NPRS scale: 0/10  Pain location: L achilles tendon Pain description: None at this time.  Aggravating factors: None  Relieving factors: Aleve   PRECAUTIONS: Other: WBAT without boot.   RED FLAGS: None   WEIGHT BEARING RESTRICTIONS: Yes WBAT without boot   FALLS:  Has patient fallen in last 6 months? No  LIVING ENVIRONMENT: Lives with: lives with their spouse Lives in: House/apartment Stairs: Yes: Internal: 14 steps; on right going up and External: 5 steps; bilateral but cannot reach both Has following equipment at home: Otho Blitz - 4 wheeled, shower chair, and [employer]  OCCUPATION: Retired   PLOF: Independent  PATIENT GOALS: Pt would like to get back to golfing.   NEXT MD VISIT: 2 months.   OBJECTIVE:  Note: Objective measures were completed at Evaluation unless otherwise noted.  DIAGNOSTIC FINDINGS: None recent in Chart.    LOWER EXTREMITY ROM:  Active ROM Right eval Left eval Left 4/1 Right 4/10 Left 4/10 Left 4/17  Hip flexion        Hip extension        Hip abduction        Hip adduction        Hip internal rotation        Hip external rotation        Knee flexion        Knee extension        Ankle dorsiflexion 5 Lacking 4 degrees Lacking 2 deg from neutral 17 2 deg 7 deg  Ankle plantarflexion 40 42       Ankle inversion 20 22      Ankle eversion 25 25       (Blank rows = not tested)                                                                                                                                TREATMENT:  4/17  Reviewed pt presentation, pain level, and response to prior Rx.   Scifit recumbent bike x 5 mins Ankle DF/PF AROM w/n protocol range x10 reps.  PT educated pt in appropriate range including not going past or into tension.  Assessed ankle DF AROM.  Pt received L ankle DF/PF PROM per pt and tissue tolerance w/n protocol range.  Ankle PF, Eve, and DF with YTB x 10, RTB x 10;   Inv YTB 2x10 seated calf raise x10, 2# ankle weight on thigh x 10, 3# ankle weight on thigh x 10  Updated HEP.  Pt received a HEP handout and was educated in correct form and appropriate frequency.  Pt instructed he should not have pain with HEP.  PT instructed pt to have a bend in knee and not have his knee straight when performing theraband ankle exercises.      PATIENT EDUCATION:  Education details:  PT instructed pt he should not perform any running or jogging and pt states he knows he shouldn't.  PT answered pt's questions.  Using ice.  Relevant anatomy, protocol limits and restrictions, appropriate ROM, diagnosis, prognosis, HEP,  and POC. Person educated: Patient Education method: Medical illustrator, verbal and tactile cues Education comprehension: verbalized understanding and returned demonstration, verbal and tactile cues required  HOME EXERCISE PROGRAM: Access Code: [email or web address] URL: [email or web address] Date: 02/29/2024 Prepared by: Royal Hawthorn  Exercises - Sidelying Hip Abduction  - 1-2 x daily - 7 x weekly - 3 sets - 10 reps - Active Straight Leg Raise with Quad Set  - 1-2 x daily - 7 x weekly - 3 sets -  10 reps - Seated Long Arc Quad  - 1-2 x daily - 7 x weekly - 3 sets - 10 reps - Supine Ankle Inversion Eversion AROM  - 1-2 x daily - 7 x weekly -  3 sets - 10 reps - Supine Ankle Dorsiflexion and Plantarflexion AROM  - 1-2 x daily - 7 x weekly - 3 sets - 10 reps  Updated HEP: - Long Sitting Ankle Plantar Flexion with Resistance  - 1 x daily - 3-4 x weekly - 2 sets - 10 reps - Long Sitting Ankle Inversion with Anchored Resistance  - 1 x daily - 3-4 x weekly - 2 sets - 10 reps - Long Sitting Ankle Eversion with Resistance  - 1 x daily - 3-4 x weekly - 2 sets - 10 reps   ASSESSMENT:  CLINICAL IMPRESSION:  Pt is progressing well with ankle DF ROM.  He demonstrates improved ankle DF AROM to 7 deg today with knee flexed.  Pt tolerated ankle DF/PF PROM without any c/o's.  Pt is progressing appropriately with protocol with good tolerance.  Pt tolerated exercises well without c/o's.  PT added resistance to seated heel raises and he tolerated it well without c/o's.  PT answered pt's questions and instructed him in protocol limitations/restrictions.  Pt verbalized good understanding of protocol limitations/restrictions.  PT updated HEP and gave pt a HEP handout.  Pt responded well to Rx reporting no pain after Rx.  He should benefit from continued skilled PT per protocol to address impairments and goals and to improve overall function.   OBJECTIVE IMPAIRMENTS: decreased activity tolerance, difficulty walking, decreased balance, decreased endurance, decreased mobility, decreased ROM, decreased strength, impaired flexibility, impaired UE/LE use, postural dysfunction, and pain.  ACTIVITY LIMITATIONS: bending, lifting, carry, locomotion, cleaning, community activity, driving, and or occupation  PERSONAL FACTORS: Anxiety, HTN, PVC's, parkinson's  are also affecting patient's functional outcome.  REHAB POTENTIAL: Good  CLINICAL DECISION MAKING: Stable/uncomplicated  EVALUATION COMPLEXITY: Low    GOALS: Short term PT Goals Target date: 03/21/2024 Pt will be I and compliant with HEP. Baseline:  Goal status: New Pt will be able to walk without  boot for 50% of the day without pain.   Long term PT goals Target date: 04/25/2024 Pt will improve ROM to [facility] to improve functional mobility Baseline: Goal status: New Pt will improve hip/knee strength to at least 5-/5 MMT to improve functional strength Baseline: Goal status: New Pt will improve LEFS to at least 9 points to to show improved function per MCID Baseline: Goal status: New Pt will reduce pain by overall 50% overall with usual activity Baseline: Goal status: New Pt will return to golf and participate in other recreational activities without complaints of pain or limitations.  Baseline: Goal status: New Pt will be able to ambulate community distances at least 1000 ft WNL gait pattern without complaints Baseline: Goal status: New  PLAN: PT FREQUENCY: 1-2 times per week   PT DURATION: 8-12 weeks  PLANNED INTERVENTIONS (unless contraindicated): aquatic PT, Canalith repositioning, cryotherapy, Electrical stimulation, Iontophoresis with 4 mg/ml dexamethasome, Moist heat, traction, Ultrasound, gait training, Therapeutic exercise, balance training, neuromuscular re-education, patient/family education, prosthetic training, manual techniques, passive ROM, dry needling, taping, vasopnuematic device, vestibular, spinal manipulations, joint manipulations  PLAN FOR NEXT SESSION: Progress per achilles tendon repair protocol.  Pt is having a surgery to remove kidney stones and will be put on hold when he has that surgery.     Trina Fujita III PT, DPT 03/19/24 3:49 PM

## 2024-03-21 ENCOUNTER — Encounter (HOSPITAL_BASED_OUTPATIENT_CLINIC_OR_DEPARTMENT_OTHER): Payer: Self-pay | Admitting: Physical Therapy

## 2024-03-21 ENCOUNTER — Ambulatory Visit (HOSPITAL_BASED_OUTPATIENT_CLINIC_OR_DEPARTMENT_OTHER): Admitting: Physical Therapy

## 2024-03-21 DIAGNOSIS — M25572 Pain in left ankle and joints of left foot: Secondary | ICD-10-CM

## 2024-03-21 DIAGNOSIS — M6281 Muscle weakness (generalized): Secondary | ICD-10-CM

## 2024-03-21 DIAGNOSIS — R2689 Other abnormalities of gait and mobility: Secondary | ICD-10-CM | POA: Diagnosis not present

## 2024-03-21 DIAGNOSIS — R600 Localized edema: Secondary | ICD-10-CM | POA: Diagnosis not present

## 2024-03-21 DIAGNOSIS — R2681 Unsteadiness on feet: Secondary | ICD-10-CM | POA: Diagnosis not present

## 2024-03-21 DIAGNOSIS — R29818 Other symptoms and signs involving the nervous system: Secondary | ICD-10-CM | POA: Diagnosis not present

## 2024-03-21 DIAGNOSIS — R471 Dysarthria and anarthria: Secondary | ICD-10-CM | POA: Diagnosis not present

## 2024-03-21 DIAGNOSIS — R278 Other lack of coordination: Secondary | ICD-10-CM | POA: Diagnosis not present

## 2024-03-21 NOTE — Therapy (Signed)
 OUTPATIENT PHYSICAL THERAPY LOWER EXTREMITYTREATMENT   Patient Name: Sean Mendoza MRN: [id number] DOB:[birth year], 78 y.o., male Today's Date: 03/21/2024  END OF SESSION:  PT End of Session - 03/21/24 1006     Visit Number 6    Number of Visits 24    Date for PT Re-Evaluation 04/25/24    Authorization Type BLUE CROSS BLUE [employer] MEDICARE    PT Start Time 1005    PT Stop Time 1045    PT Time Calculation (min) 40 min    Activity Tolerance Patient tolerated treatment well    Behavior During Therapy WFL for tasks assessed/performed                 Past Medical History:  Diagnosis Date   Amaurosis fugax    Anxiety    Cough    wert on set 09/2010   HTN (hypertension)    Migraine    PVC's (premature ventricular contractions)    Past Surgical History:  Procedure Laterality Date   ACHILLES TENDON SURGERY Left 01/15/2024   Procedure: LEFT ACHILLES TENDON REPAIR WITH DEBRIDEMENT;  Surgeon: Ali Ink, MD;  Location: [facility];  Service: Orthopedics;  Laterality: Left;   CERVICAL DISCECTOMY     x 2   ELBOW SURGERY Left    x 3   HERNIA REPAIR     inguinal x 2   Patient Active Problem List   Diagnosis Date Noted   PVC's (premature ventricular contractions) 03/15/2023   Orthostatic lightheadedness 12/31/2020   Sleep apnea 03/19/2011   Hypertension 03/19/2011   Right bundle branch block 03/19/2011   COUGH 11/02/2010   DYSLIPIDEMIA 06/15/2010   Palpitations 06/15/2010    PCP: Ronna Coho, MD  REFERRING PROVIDER: Ali Ink, MD  REFERRING DIAG: Strain of left Achilles tendon, initial encounter [W09.811B]   THERAPY DIAG:  Pain in left ankle and joints of left foot  Other abnormalities of gait and mobility  Muscle weakness (generalized)  Localized edema  Rationale for Evaluation and Treatment: Rehabilitation  ONSET DATE: 01/15/2024  SUBJECTIVE:   SUBJECTIVE STATEMENT: Denies sharp pain. Stairs are hard.   Pt used  the recumbent bike for 25 mins and felt good.  Pt states he is still taking 1 step at a time on the stairs.  Pt states he had to jog/run a couple of steps to grab his granddaughter who was in a motorized cart.  He states he felt a little pain though nothing bad and had no pain afterwards. Pt has a kidney stone and saw the urologist today.  He is going to have surgery to remove kidney stone.       PERTINENT HISTORY: Left Achilles tendon open debridement with secondary repair on 01/15/2024 Anxiety, HTN, PVC's, parkinson's   PAIN:  Are you having pain? Yes: NPRS scale: 0/10  Pain location: L achilles tendon Pain description: None at this time.  Aggravating factors: None  Relieving factors: Aleve   PRECAUTIONS: Other: WBAT without boot.   RED FLAGS: None   WEIGHT BEARING RESTRICTIONS: Yes WBAT without boot   FALLS:  Has patient fallen in last 6 months? No  LIVING ENVIRONMENT: Lives with: lives with their spouse Lives in: House/apartment Stairs: Yes: Internal: 14 steps; on right going up and External: 5 steps; bilateral but cannot reach both Has following equipment at home: [employer] Blitz - 4 wheeled, shower chair, and Scooter  OCCUPATION: Retired   PLOF: Independent  PATIENT GOALS: Pt would like to get back to golfing.  NEXT MD VISIT: 2 months.   OBJECTIVE:  Note: Objective measures were completed at Evaluation unless otherwise noted.  DIAGNOSTIC FINDINGS: None recent in Chart.    LOWER EXTREMITY ROM:  Active ROM Right eval Left eval Left 4/1 Right 4/10 Left 4/10 Left 4/17  Hip flexion        Hip extension        Hip abduction        Hip adduction        Hip internal rotation        Hip external rotation        Knee flexion        Knee extension        Ankle dorsiflexion 5 Lacking 4 degrees Lacking 2 deg from neutral 17 2 deg 7 deg  Ankle plantarflexion 40 42      Ankle inversion 20 22      Ankle eversion 25 25       (Blank rows = not tested)                                                                                                                                 TREATMENT:  4/19 Prone IASTM gastroc- focus on lateral belly Sidelying supination  Lunge a/p weight shift for gastroc eccentrics Small heel raise with ball bw ankles Seated hamstrings stretch Sit<>stand LSVT BIG- reviewed sequence  4/17  Reviewed pt presentation, pain level, and response to prior Rx.   Scifit recumbent bike x 5 mins Ankle DF/PF AROM w/n protocol range x10 reps.  PT educated pt in appropriate range including not going past or into tension.  Assessed ankle DF AROM.  Pt received L ankle DF/PF PROM per pt and tissue tolerance w/n protocol range.  Ankle PF, Eve, and DF with YTB x 10, RTB x 10;   Inv YTB 2x10 seated calf raise x10, 2# ankle weight on thigh x 10, 3# ankle weight on thigh x 10  Updated HEP.  Pt received a HEP handout and was educated in correct form and appropriate frequency.  Pt instructed he should not have pain with HEP.  PT instructed pt to have a bend in knee and not have his knee straight when performing theraband ankle exercises.      PATIENT EDUCATION:  Education details:  PT instructed pt he should not perform any running or jogging and pt states he knows he shouldn't.  PT answered pt's questions.  Using ice.  Relevant anatomy, protocol limits and restrictions, appropriate ROM, diagnosis, prognosis, HEP,  and POC. Person educated: Patient Education method: Medical illustrator, verbal and tactile cues Education comprehension: verbalized understanding and returned demonstration, verbal and tactile cues required  HOME EXERCISE PROGRAM: Access Code: [email or web address] URL: [email or web address] Date: 02/29/2024 Prepared by: Fredia Janus  Exercises - Sidelying Hip Abduction  - 1-2 x daily - 7 x weekly - 3 sets - 10 reps - Active Straight Leg Raise  with Quad Set  - 1-2 x daily - 7 x weekly - 3 sets - 10 reps - Seated Long  Arc Quad  - 1-2 x daily - 7 x weekly - 3 sets - 10 reps - Supine Ankle Inversion Eversion AROM  - 1-2 x daily - 7 x weekly - 3 sets - 10 reps - Supine Ankle Dorsiflexion and Plantarflexion AROM  - 1-2 x daily - 7 x weekly - 3 sets - 10 reps  Updated HEP: - Long Sitting Ankle Plantar Flexion with Resistance  - 1 x daily - 3-4 x weekly - 2 sets - 10 reps - Long Sitting Ankle Inversion with Anchored Resistance  - 1 x daily - 3-4 x weekly - 2 sets - 10 reps - Long Sitting Ankle Eversion with Resistance  - 1 x daily - 3-4 x weekly - 2 sets - 10 reps  LSVT BIG program provided  ASSESSMENT:  CLINICAL IMPRESSION:  Provided PT with printout of LSVT BIG exercises and encouraged him to do them in the pool for reduced weight through ankle and resistance for strengthening. Tends to stand with lack of hip and knee extension placing greater strain on achilles.   OBJECTIVE IMPAIRMENTS: decreased activity tolerance, difficulty walking, decreased balance, decreased endurance, decreased mobility, decreased ROM, decreased strength, impaired flexibility, impaired UE/LE use, postural dysfunction, and pain.  ACTIVITY LIMITATIONS: bending, lifting, carry, locomotion, cleaning, community activity, driving, and or occupation  PERSONAL FACTORS: Anxiety, HTN, PVC's, parkinson's  are also affecting patient's functional outcome.  REHAB POTENTIAL: Good  CLINICAL DECISION MAKING: Stable/uncomplicated  EVALUATION COMPLEXITY: Low    GOALS: Short term PT Goals Target date: 03/21/2024 Pt will be I and compliant with HEP. Baseline:  Goal status: New Pt will be able to walk without boot for 50% of the day without pain.   Long term PT goals Target date: 04/25/2024 Pt will improve ROM to [facility] to improve functional mobility Baseline: Goal status: New Pt will improve hip/knee strength to at least 5-/5 MMT to improve functional strength Baseline: Goal status: New Pt will improve LEFS to at least 9 points to to show  improved function per MCID Baseline: Goal status: New Pt will reduce pain by overall 50% overall with usual activity Baseline: Goal status: New Pt will return to golf and participate in other recreational activities without complaints of pain or limitations.  Baseline: Goal status: New Pt will be able to ambulate community distances at least 1000 ft WNL gait pattern without complaints Baseline: Goal status: New  PLAN: PT FREQUENCY: 1-2 times per week   PT DURATION: 8-12 weeks  PLANNED INTERVENTIONS (unless contraindicated): aquatic PT, Canalith repositioning, cryotherapy, Electrical stimulation, Iontophoresis with 4 mg/ml dexamethasome, Moist heat, traction, Ultrasound, gait training, Therapeutic exercise, balance training, neuromuscular re-education, patient/family education, prosthetic training, manual techniques, passive ROM, dry needling, taping, vasopnuematic device, vestibular, spinal manipulations, joint manipulations  PLAN FOR NEXT SESSION: Progress per achilles tendon repair protocol.  Pt is having a surgery to remove kidney stones and will be put on hold when he has that surgery.    Chanah Tidmore C. Javen Ridings PT, DPT 03/21/24 12:25 PM

## 2024-03-23 ENCOUNTER — Ambulatory Visit: Payer: Medicare Other | Attending: Internal Medicine | Admitting: Internal Medicine

## 2024-03-23 ENCOUNTER — Encounter: Payer: Self-pay | Admitting: Internal Medicine

## 2024-03-23 VITALS — BP 122/76 | HR 61 | Ht 72.0 in | Wt 209.6 lb

## 2024-03-23 DIAGNOSIS — I4510 Unspecified right bundle-branch block: Secondary | ICD-10-CM | POA: Diagnosis not present

## 2024-03-23 DIAGNOSIS — R42 Dizziness and giddiness: Secondary | ICD-10-CM | POA: Diagnosis not present

## 2024-03-23 DIAGNOSIS — I493 Ventricular premature depolarization: Secondary | ICD-10-CM | POA: Diagnosis not present

## 2024-03-23 NOTE — Progress Notes (Signed)
 Patient Care Team: Ronna Coho, MD as PCP - General (Family Medicine)   HPI  Sean Mendoza is a 78 y.o. 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  There have been syncope.   Scant palpitations.  Some lightheadedness post exercise.    Date Cr K Hgb  6/24 1.2 4.8 15.4               Past Medical History:  Diagnosis Date   Amaurosis fugax    Anxiety    Cough    wert on set 09/2010   HTN (hypertension)    Migraine    PVC's (premature ventricular contractions)     Past Surgical History:  Procedure Laterality Date   ACHILLES TENDON SURGERY Left 01/15/2024   Procedure: LEFT ACHILLES TENDON REPAIR WITH DEBRIDEMENT;  Surgeon: Ali Ink, MD;  Location: [facility];  Service: Orthopedics;  Laterality: Left;   CERVICAL DISCECTOMY     x 2   ELBOW SURGERY Left    x 3   HERNIA REPAIR     inguinal x 2    Current Outpatient Medications  Medication Sig Dispense Refill   atorvastatin  (LIPITOR) 20 MG tablet TAKE 1 TABLET BY MOUTH EVERY DAY 90 tablet 0   B Complex Vitamins (B COMPLEX PO) Take by mouth. 1 tablet daily     carbidopa-levodopa (SINEMET IR) 25-100 MG tablet Take 1 tablet by mouth 3 (three) times daily.     clonazePAM  (KLONOPIN ) 1 MG tablet Take 1.5 tablets (1.5 mg total) by mouth at bedtime. 135 tablet 1   Coenzyme Q10 (COQ-10) 400 MG CAPS Take 1 tablet by mouth daily.     diltiazem  (CARDIZEM  CD) 120 MG 24 hr capsule TAKE 1 CAPSULE BY MOUTH EVERY DAY 90 capsule 3   FLUoxetine  (PROZAC ) 40 MG capsule TAKE 1 CAPSULE (40 MG TOTAL) BY MOUTH DAILY. 30 capsule 0   levETIRAcetam (KEPPRA) 250 MG tablet Take 250 mg by mouth daily.     MAGNESIUM GLUCONATE PO Take 400 mg by mouth daily.       Melatonin 10 MG TABS at bedtime.     propranolol  (INDERAL ) 10 MG tablet TAKE 1 TO 2 TABLETS EVERY 12 HOURS FOR TREMOR (Patient taking differently: Take 10 mg by mouth daily in the afternoon. 1 daily) 360 tablet 2   No current facility-administered medications for this visit.    Allergies  Allergen Reactions   Ampicillin     REACTION: itching   Promethazine Hcl Other (See Comments)   Sulfonamide Derivatives     unknown    Review of Systems negative except from HPI and PMH  Physical Exam BP 122/76   Pulse 61   Ht 6' (1.829 m)   Wt 209 lb 9.6 oz (95.1 kg)   SpO2 96%   BMI 28.43 kg/m  Well developed and nourished in no acute distress HENT normal Neck supple with JVP-  flat   Clear Regular rate and rhythm, no murmurs or gallops Abd-soft with active BS No Clubbing cyanosis edema Skin-warm and dry A & Oriented  Grossly normal sensory and motor function  ECG sinus at 62 0 17/14/44 Right bundle branch block Left axis deviation   Assessment and  Plan  PVCs     HTN    Orthostatic hypotension  Parkinsons  Right bundle branch block-old  Patient's PVCs are quiescient.  Will continue on low-dose propranolol .  Blood pressure is well-controlled also on the diltiazem  120.  We may have to back off and allow him to have a systolic blood pressure about 130 instead of 120 so as to protect him from the risk of falling blood pressure.  We have discussed orthostatic hypotension and mechanisms and nonpharmacological therapies and recommended abdominal binding and/or thigh sleeves.  I have also reached out to neurology as to which is their drug of choice in the context of Parkinson's for orthostasis.  Suggested also the use of a shower chair and reviewed isometric contraction prior to standing.  Messaged Dr Tat for wisdom, agrees with primary efforts with nonpharmacological approaches and then used midodrine

## 2024-03-23 NOTE — Patient Instructions (Signed)
 Medication Instructions:  Your physician recommends that you continue on your current medications as directed. Please refer to the Current Medication list given to you today.  *If you need a refill on your cardiac medications before your next appointment, please call your pharmacy*  Lab Work: None ordered.  If you have labs (blood work) drawn today and your tests are completely normal, you will receive your results only by: MyChart Message (if you have MyChart) OR A paper copy in the mail If you have any lab test that is abnormal or we need to change your treatment, we will call you to review the results.  Testing/Procedures: None ordered.   Follow-Up: At [facility], you and your health needs are our priority.  As part of our continuing mission to provide you with exceptional heart care, our providers are all part of one team.  This team includes your primary Cardiologist (physician) and Advanced Practice Providers or APPs (Physician Assistants and Nurse Practitioners) who all work together to provide you with the care you need, when you need it.  Your next appointment:   12 months      1st Floor: - Lobby - Registration  - Pharmacy  - Lab - Cafe  2nd Floor: - PV Lab - Diagnostic Testing (echo, CT, nuclear med)  3rd Floor: - Vacant  4th Floor: - TCTS (cardiothoracic surgery) - AFib Clinic - Structural Heart Clinic - Vascular Surgery  - Vascular Ultrasound  5th Floor: - HeartCare Cardiology (general and EP) - Clinical Pharmacy for coumadin, hypertension, lipid, weight-loss medications, and med management appointments    Valet parking services will be available as well.

## 2024-03-24 ENCOUNTER — Encounter: Payer: Self-pay | Admitting: Internal Medicine

## 2024-03-25 ENCOUNTER — Encounter (HOSPITAL_BASED_OUTPATIENT_CLINIC_OR_DEPARTMENT_OTHER): Payer: Self-pay | Admitting: Physical Therapy

## 2024-03-25 ENCOUNTER — Ambulatory Visit (HOSPITAL_BASED_OUTPATIENT_CLINIC_OR_DEPARTMENT_OTHER): Admitting: Physical Therapy

## 2024-03-25 DIAGNOSIS — R471 Dysarthria and anarthria: Secondary | ICD-10-CM

## 2024-03-25 DIAGNOSIS — M6281 Muscle weakness (generalized): Secondary | ICD-10-CM

## 2024-03-25 DIAGNOSIS — M25572 Pain in left ankle and joints of left foot: Secondary | ICD-10-CM

## 2024-03-25 DIAGNOSIS — R29818 Other symptoms and signs involving the nervous system: Secondary | ICD-10-CM

## 2024-03-25 DIAGNOSIS — R600 Localized edema: Secondary | ICD-10-CM | POA: Diagnosis not present

## 2024-03-25 DIAGNOSIS — R278 Other lack of coordination: Secondary | ICD-10-CM

## 2024-03-25 DIAGNOSIS — R2689 Other abnormalities of gait and mobility: Secondary | ICD-10-CM

## 2024-03-25 DIAGNOSIS — R2681 Unsteadiness on feet: Secondary | ICD-10-CM | POA: Diagnosis not present

## 2024-03-25 NOTE — Therapy (Signed)
 OUTPATIENT PHYSICAL THERAPY LOWER EXTREMITYTREATMENT   Patient Name: YAZEED PRYER MRN: [id number] DOB:1946/10/11, 78 y.o., male Today's Date: 03/25/2024  END OF SESSION:  PT End of Session - 03/25/24 1028     Visit Number 7    Number of Visits 24    Authorization Type BLUE [employer]OSS [employer]ICARE    PT Start Time 1028    PT Stop Time 1059    PT Time Calculation (min) 31 min    Activity Tolerance Patient tolerated treatment well;No increased pain    Behavior During Therapy [facility] for tasks assessed/performed                  Past Medical History:  Diagnosis Date   Amaurosis fugax    Anxiety    Cough    wert on set 09/2010   HTN (hypertension)    Migraine    PVC's (premature ventricular contractions)    Past Surgical History:  Procedure Laterality Date   ACHILLES TENDON SURGERY Left 01/15/2024   Procedure: LEFT ACHILLES TENDON REPAIR WITH DEBRIDEMENT;  Surgeon: Ali Ink, MD;  Location: [facility];  Service: Orthopedics;  Laterality: Left;   CERVICAL DISCECTOMY     x 2   ELBOW SURGERY Left    x 3   HERNIA REPAIR     inguinal x 2   Patient Active Problem List   Diagnosis Date Noted   PVC's (premature ventricular contractions) 03/15/2023   Orthostatic lightheadedness 12/31/2020   Sleep apnea 03/19/2011   Hypertension 03/19/2011   Right bundle branch block 03/19/2011   COUGH 11/02/2010   DYSLIPIDEMIA 06/15/2010   Palpitations 06/15/2010    PCP: Ronna Coho, MD  REFERRING PROVIDER: Ali Ink, MD  REFERRING DIAG: Strain of left Achilles tendon, initial encounter [W09.811B]   THERAPY DIAG:  Pain in left ankle and joints of left foot  Unsteadiness on feet  Dysarthria and anarthria  Other abnormalities of gait and mobility  Other symptoms and signs involving the nervous system  Muscle weakness (generalized)  Localized edema  Other lack of coordination  Rationale for Evaluation and Treatment:  Rehabilitation  ONSET DATE: 01/15/2024  SUBJECTIVE:   SUBJECTIVE STATEMENT: 4/23 Pt says his achilles and calf was sore today and thinks its because he was sedentary the last couple days.   Pt used the recumbent bike for 25 mins and felt good.  Pt states he is still taking 1 step at a time on the stairs.  Pt states he had to jog/run a couple of steps to grab his granddaughter who was in a motorized cart.  He states he felt a little pain though nothing bad and had no pain afterwards. Pt has a kidney stone and saw the urologist today.  He is going to have surgery to remove kidney stone.       PERTINENT HISTORY: Left Achilles tendon open debridement with secondary repair on 01/15/2024 Anxiety, HTN, PVC's, parkinson's   PAIN:  Are you having pain? Yes: NPRS scale: 0/10  Pain location: L achilles tendon Pain description: None at this time.  Aggravating factors: None  Relieving factors: Aleve   PRECAUTIONS: Other: WBAT without boot.   RED FLAGS: None   WEIGHT BEARING RESTRICTIONS: Yes WBAT without boot   FALLS:  Has patient fallen in last 6 months? No  LIVING ENVIRONMENT: Lives with: lives with their spouse Lives in: House/apartment Stairs: Yes: Internal: 14 steps; on right going up and External: 5 steps; bilateral but cannot reach both Has  following equipment at home: [employer] - 4 wheeled, shower chair, and Scooter  OCCUPATION: Retired   PLOF: Independent  PATIENT GOALS: Pt would like to get back to golfing.   NEXT MD VISIT: 2 months.   OBJECTIVE:  Note: Objective measures were completed at Evaluation unless otherwise noted.  DIAGNOSTIC FINDINGS: None recent in Chart.    LOWER EXTREMITY ROM:  Active ROM Right eval Left eval Left 4/1 Right 4/10 Left 4/10 Left 4/17  Hip flexion        Hip extension        Hip abduction        Hip adduction        Hip internal rotation        Hip external rotation        Knee flexion        Knee extension        Ankle  dorsiflexion 5 Lacking 4 degrees Lacking 2 deg from neutral 17 2 deg 7 deg  Ankle plantarflexion 40 42      Ankle inversion 20 22      Ankle eversion 25 25       (Blank rows = not tested)                                                                                                                                TREATMENT: 4/23 Manual: All PROM performed with distraction to reduce pain and improve movement Prone IASTM gastroc- focus on medial belly PROM DF  There-ex: Exercise bike warm up PF, DF, EV with yellow RTB 2x10 There-Act Sit to stand 2x8   4/19 Prone IASTM gastroc- focus on lateral belly Sidelying supination  Lunge a/p weight shift for gastroc eccentrics Small heel raise with ball bw ankles Seated hamstrings stretch Sit<>stand LSVT BIG- reviewed sequence  4/17  Reviewed pt presentation, pain level, and response to prior Rx.   Scifit recumbent bike x 5 mins Ankle DF/PF AROM w/n protocol range x10 reps.  PT educated pt in appropriate range including not going past or into tension.  Assessed ankle DF AROM.  Pt received L ankle DF/PF PROM per pt and tissue tolerance w/n protocol range.  Ankle PF, Eve, and DF with YTB x 10, RTB x 10;   Inv YTB 2x10 seated calf raise x10, 2# ankle weight on thigh x 10, 3# ankle weight on thigh x 10  Updated HEP.  Pt received a HEP handout and was educated in correct form and appropriate frequency.  Pt instructed he should not have pain with HEP.  PT instructed pt to have a bend in knee and not have his knee straight when performing theraband ankle exercises.      PATIENT EDUCATION:  Education details:  PT instructed pt he should not perform any running or jogging and pt states he knows he shouldn't.  PT answered pt's questions.  Using ice.  Relevant anatomy,  protocol limits and restrictions, appropriate ROM, diagnosis, prognosis, HEP,  and POC. Person educated: Patient Education method: Software engineer, verbal and tactile cues Education comprehension: verbalized understanding and returned demonstration, verbal and tactile cues required  HOME EXERCISE PROGRAM: Access Code: [email or web address] URL: [email or web address] Date: 02/29/2024 Prepared by: Fredia Janus  Exercises - Sidelying Hip Abduction  - 1-2 x daily - 7 x weekly - 3 sets - 10 reps - Active Straight Leg Raise with Quad Set  - 1-2 x daily - 7 x weekly - 3 sets - 10 reps - Seated Long Arc Quad  - 1-2 x daily - 7 x weekly - 3 sets - 10 reps - Supine Ankle Inversion Eversion AROM  - 1-2 x daily - 7 x weekly - 3 sets - 10 reps - Supine Ankle Dorsiflexion and Plantarflexion AROM  - 1-2 x daily - 7 x weekly - 3 sets - 10 reps  Updated HEP: - Long Sitting Ankle Plantar Flexion with Resistance  - 1 x daily - 3-4 x weekly - 2 sets - 10 reps - Long Sitting Ankle Inversion with Anchored Resistance  - 1 x daily - 3-4 x weekly - 2 sets - 10 reps - Long Sitting Ankle Eversion with Resistance  - 1 x daily - 3-4 x weekly - 2 sets - 10 reps  LSVT BIG program provided  ASSESSMENT:  CLINICAL IMPRESSION:  4/23 Pt warmed up on the bike with no increase in symptoms. Manual therapy was performed noting tightness in lateral achilles region and right gastroc belly. Pt responded well to treatment noting diminished pain in calf. Exercises were performed with no increase in pain showing good muscle activation and technique. Pt will continue to benefit from skilled physical therapy to progress MD protocol for return to functional activity.   OBJECTIVE IMPAIRMENTS: decreased activity tolerance, difficulty walking, decreased balance, decreased endurance, decreased mobility, decreased ROM, decreased strength, impaired flexibility, impaired UE/LE use, postural dysfunction, and pain.  ACTIVITY LIMITATIONS: bending, lifting, carry, locomotion, cleaning, community activity, driving, and or occupation  PERSONAL FACTORS: Anxiety, HTN, PVC's,  parkinson's  are also affecting patient's functional outcome.  REHAB POTENTIAL: Good  CLINICAL DECISION MAKING: Stable/uncomplicated  EVALUATION COMPLEXITY: Low    GOALS: Short term PT Goals Target date: 03/21/2024 Pt will be I and compliant with HEP. Baseline:  Goal status: New Pt will be able to walk without boot for 50% of the day without pain.   Long term PT goals Target date: 04/25/2024 Pt will improve ROM to [facility] to improve functional mobility Baseline: Goal status: New Pt will improve hip/knee strength to at least 5-/5 MMT to improve functional strength Baseline: Goal status: New Pt will improve LEFS to at least 9 points to to show improved function per MCID Baseline: Goal status: New Pt will reduce pain by overall 50% overall with usual activity Baseline: Goal status: New Pt will return to golf and participate in other recreational activities without complaints of pain or limitations.  Baseline: Goal status: New Pt will be able to ambulate community distances at least 1000 ft WNL gait pattern without complaints Baseline: Goal status: New  PLAN: PT FREQUENCY: 1-2 times per week   PT DURATION: 8-12 weeks  PLANNED INTERVENTIONS (unless contraindicated): aquatic PT, Canalith repositioning, cryotherapy, Electrical stimulation, Iontophoresis with 4 mg/ml dexamethasome, Moist heat, traction, Ultrasound, gait training, Therapeutic exercise, balance training, neuromuscular re-education, patient/family education, prosthetic training, manual techniques, passive ROM, dry needling, taping, vasopnuematic device, vestibular, spinal manipulations, joint manipulations  PLAN  FOR NEXT SESSION: Progress per achilles tendon repair protocol.  Pt is having a surgery to remove kidney stones and will be put on hold when he has that surgery.    Herminia Lope Jonny Dearden SPT 03/25/24 2:07 PM

## 2024-03-27 ENCOUNTER — Ambulatory Visit (HOSPITAL_BASED_OUTPATIENT_CLINIC_OR_DEPARTMENT_OTHER): Admitting: Physical Therapy

## 2024-03-27 ENCOUNTER — Encounter (HOSPITAL_BASED_OUTPATIENT_CLINIC_OR_DEPARTMENT_OTHER): Payer: Self-pay | Admitting: Physical Therapy

## 2024-03-27 DIAGNOSIS — R2689 Other abnormalities of gait and mobility: Secondary | ICD-10-CM

## 2024-03-27 DIAGNOSIS — R2681 Unsteadiness on feet: Secondary | ICD-10-CM | POA: Diagnosis not present

## 2024-03-27 DIAGNOSIS — R278 Other lack of coordination: Secondary | ICD-10-CM | POA: Diagnosis not present

## 2024-03-27 DIAGNOSIS — M25572 Pain in left ankle and joints of left foot: Secondary | ICD-10-CM

## 2024-03-27 DIAGNOSIS — M6281 Muscle weakness (generalized): Secondary | ICD-10-CM

## 2024-03-27 DIAGNOSIS — R600 Localized edema: Secondary | ICD-10-CM | POA: Diagnosis not present

## 2024-03-27 DIAGNOSIS — R29818 Other symptoms and signs involving the nervous system: Secondary | ICD-10-CM | POA: Diagnosis not present

## 2024-03-27 DIAGNOSIS — R471 Dysarthria and anarthria: Secondary | ICD-10-CM | POA: Diagnosis not present

## 2024-03-27 NOTE — Therapy (Signed)
 OUTPATIENT PHYSICAL THERAPY LOWER EXTREMITYTREATMENT   Patient Name: Sean Mendoza MRN: [id number] DOB:Jul 27, 1946, 78 y.o., male Today's Date: 03/27/2024  END OF SESSION:  PT End of Session - 03/27/24 1323     Visit Number 8    Number of Visits 24    Authorization Type BLUE CROSS BLUE [employer]IELD MEDICARE    PT Start Time 1020    PT Stop Time 1058    PT Time Calculation (min) 38 min    Activity Tolerance Patient tolerated treatment well;No increased pain    Behavior During Therapy [facility] for tasks assessed/performed                   Past Medical History:  Diagnosis Date   Amaurosis fugax    Anxiety    Cough    wert on set 09/2010   HTN (hypertension)    Migraine    PVC's (premature ventricular contractions)    Past Surgical History:  Procedure Laterality Date   ACHILLES TENDON SURGERY Left 01/15/2024   Procedure: LEFT ACHILLES TENDON REPAIR WITH DEBRIDEMENT;  Surgeon: Ali Ink, MD;  Location: [facility];  Service: Orthopedics;  Laterality: Left;   CERVICAL DISCECTOMY     x 2   ELBOW SURGERY Left    x 3   HERNIA REPAIR     inguinal x 2   Patient Active Problem List   Diagnosis Date Noted   PVC's (premature ventricular contractions) 03/15/2023   Orthostatic lightheadedness 12/31/2020   Sleep apnea 03/19/2011   Hypertension 03/19/2011   Right bundle branch block 03/19/2011   COUGH 11/02/2010   DYSLIPIDEMIA 06/15/2010   Palpitations 06/15/2010    PCP: Ronna Coho, MD  REFERRING PROVIDER: Ali Ink, MD  REFERRING DIAG: Strain of left Achilles tendon, initial encounter [W09.811B]   THERAPY DIAG:  Pain in left ankle and joints of left foot  Unsteadiness on feet  Other abnormalities of gait and mobility  Dysarthria and anarthria  Other symptoms and signs involving the nervous system  Muscle weakness (generalized)  Localized edema  Other lack of coordination  Rationale for Evaluation and Treatment:  Rehabilitation  ONSET DATE: 01/15/2024  SUBJECTIVE:   SUBJECTIVE STATEMENT: 4/23 Pt says his achilles and calf was sore today and thinks its because he was sedentary the last couple days.   Pt used the recumbent bike for 25 mins and felt good.  Pt states he is still taking 1 step at a time on the stairs.  Pt states he had to jog/run a couple of steps to grab his granddaughter who was in a motorized cart.  He states he felt a little pain though nothing bad and had no pain afterwards. Pt has a kidney stone and saw the urologist today.  He is going to have surgery to remove kidney stone.       PERTINENT HISTORY: Left Achilles tendon open debridement with secondary repair on 01/15/2024 Anxiety, HTN, PVC's, parkinson's   PAIN:  Are you having pain? Yes: NPRS scale: 0/10  Pain location: L achilles tendon Pain description: None at this time.  Aggravating factors: None  Relieving factors: Aleve   PRECAUTIONS: Other: WBAT without boot.   RED FLAGS: None   WEIGHT BEARING RESTRICTIONS: Yes WBAT without boot   FALLS:  Has patient fallen in last 6 months? No  LIVING ENVIRONMENT: Lives with: lives with their spouse Lives in: House/apartment Stairs: Yes: Internal: 14 steps; on right going up and External: 5 steps; bilateral but cannot reach both  Has following equipment at home: [employer] - 4 wheeled, shower chair, and Scooter  OCCUPATION: Retired   PLOF: Independent  PATIENT GOALS: Pt would like to get back to golfing.   NEXT MD VISIT: 2 months.   OBJECTIVE:  Note: Objective measures were completed at Evaluation unless otherwise noted.  DIAGNOSTIC FINDINGS: None recent in Chart.    LOWER EXTREMITY ROM:  Active ROM Right eval Left eval Left 4/1 Right 4/10 Left 4/10 Left 4/17  Hip flexion        Hip extension        Hip abduction        Hip adduction        Hip internal rotation        Hip external rotation        Knee flexion        Knee extension        Ankle  dorsiflexion 5 Lacking 4 degrees Lacking 2 deg from neutral 17 2 deg 7 deg  Ankle plantarflexion 40 42      Ankle inversion 20 22      Ankle eversion 25 25       (Blank rows = not tested)                                                                                                                                TREATMENT: 4/25  There-ex: Supine glute stretch 2x30sec each side LTR 3x10 Seated glute stretch 3x30sec each side Standing hip abd red RTB 3x10 each side Standing hip ext red RTB 3x10 Standing marches red RTB 3x10 Neuro-Re-ed  Marching on airex 3x10  4 way cone taps 3x     4/23 Manual: All PROM performed with distraction to reduce pain and improve movement Prone IASTM gastroc- focus on medial belly PROM DF  There-ex: Exercise bike warm up PF, DF, EV with yellow RTB 2x10 There-Act Sit to stand 2x8   4/19 Prone IASTM gastroc- focus on lateral belly Sidelying supination  Lunge a/p weight shift for gastroc eccentrics Small heel raise with ball bw ankles Seated hamstrings stretch Sit<>stand LSVT BIG- reviewed sequence  4/17  Reviewed pt presentation, pain level, and response to prior Rx.   Scifit recumbent bike x 5 mins Ankle DF/PF AROM w/n protocol range x10 reps.  PT educated pt in appropriate range including not going past or into tension.  Assessed ankle DF AROM.  Pt received L ankle DF/PF PROM per pt and tissue tolerance w/n protocol range.  Ankle PF, Eve, and DF with YTB x 10, RTB x 10;   Inv YTB 2x10 seated calf raise x10, 2# ankle weight on thigh x 10, 3# ankle weight on thigh x 10  Updated HEP.  Pt received a HEP handout and was educated in correct form and appropriate frequency.  Pt instructed he should not have pain with HEP.  PT instructed pt to  have a bend in knee and not have his knee straight when performing theraband ankle exercises.      PATIENT EDUCATION:  Education details:  PT instructed pt  he should not perform any running or jogging and pt states he knows he shouldn't.  PT answered pt's questions.  Using ice.  Relevant anatomy, protocol limits and restrictions, appropriate ROM, diagnosis, prognosis, HEP,  and POC. Person educated: Patient Education method: Medical illustrator, verbal and tactile cues Education comprehension: verbalized understanding and returned demonstration, verbal and tactile cues required  HOME EXERCISE PROGRAM: Access Code: [email or web address] URL: [email or web address] Date: 02/29/2024 Prepared by: Fredia Janus  Exercises - Sidelying Hip Abduction  - 1-2 x daily - 7 x weekly - 3 sets - 10 reps - Active Straight Leg Raise with Quad Set  - 1-2 x daily - 7 x weekly - 3 sets - 10 reps - Seated Long Arc Quad  - 1-2 x daily - 7 x weekly - 3 sets - 10 reps - Supine Ankle Inversion Eversion AROM  - 1-2 x daily - 7 x weekly - 3 sets - 10 reps - Supine Ankle Dorsiflexion and Plantarflexion AROM  - 1-2 x daily - 7 x weekly - 3 sets - 10 reps  Updated HEP: - Long Sitting Ankle Plantar Flexion with Resistance  - 1 x daily - 3-4 x weekly - 2 sets - 10 reps - Long Sitting Ankle Inversion with Anchored Resistance  - 1 x daily - 3-4 x weekly - 2 sets - 10 reps - Long Sitting Ankle Eversion with Resistance  - 1 x daily - 3-4 x weekly - 2 sets - 10 reps  LSVT BIG program provided  ASSESSMENT:  CLINICAL IMPRESSION:  4/25 Balance and strengthening exercises were primary focus of today's session. All activities were tolerated well with no increase in symptoms. Balance focused on SLS activities to increase glute med activation. Pt was educated on LE strengthening and balance importance for outdoor activities that he wants to feel more comfortable with. Session was ended going over stretches for before and after yard work. Pt tolerated the stretches well and said he would do them at home. Pt will continue to benefit from skilled physical therapy to progress  functional goals for ADL's.   OBJECTIVE IMPAIRMENTS: decreased activity tolerance, difficulty walking, decreased balance, decreased endurance, decreased mobility, decreased ROM, decreased strength, impaired flexibility, impaired UE/LE use, postural dysfunction, and pain.  ACTIVITY LIMITATIONS: bending, lifting, carry, locomotion, cleaning, community activity, driving, and or occupation  PERSONAL FACTORS: Anxiety, HTN, PVC's, parkinson's  are also affecting patient's functional outcome.  REHAB POTENTIAL: Good  CLINICAL DECISION MAKING: Stable/uncomplicated  EVALUATION COMPLEXITY: Low    GOALS: Short term PT Goals Target date: 03/21/2024 Pt will be I and compliant with HEP. Baseline:  Goal status: New Pt will be able to walk without boot for 50% of the day without pain.   Long term PT goals Target date: 04/25/2024 Pt will improve ROM to [facility] to improve functional mobility Baseline: Goal status: New Pt will improve hip/knee strength to at least 5-/5 MMT to improve functional strength Baseline: Goal status: New Pt will improve LEFS to at least 9 points to to show improved function per MCID Baseline: Goal status: New Pt will reduce pain by overall 50% overall with usual activity Baseline: Goal status: New Pt will return to golf and participate in other recreational activities without complaints of pain or limitations.  Baseline: Goal status: New Pt will be able to  ambulate community distances at least 1000 ft WNL gait pattern without complaints Baseline: Goal status: New  PLAN: PT FREQUENCY: 1-2 times per week   PT DURATION: 8-12 weeks  PLANNED INTERVENTIONS (unless contraindicated): aquatic PT, Canalith repositioning, cryotherapy, Electrical stimulation, Iontophoresis with 4 mg/ml dexamethasome, Moist heat, traction, Ultrasound, gait training, Therapeutic exercise, balance training, neuromuscular re-education, patient/family education, prosthetic training, manual  techniques, passive ROM, dry needling, taping, vasopnuematic device, vestibular, spinal manipulations, joint manipulations  PLAN FOR NEXT SESSION: Progress per achilles tendon repair protocol.  Pt is having a surgery to remove kidney stones and will be put on hold when he has that surgery.    Herminia Lope Aryahi Denzler SPT 03/27/24 1:29 PM  I have reviewed and concur with this student's documentation.   Kitty Perkins, PT 03/27/2024 3:30 PM   During this treatment session, the therapist was present, participating in and directing the treatment.

## 2024-03-31 ENCOUNTER — Encounter (HOSPITAL_BASED_OUTPATIENT_CLINIC_OR_DEPARTMENT_OTHER): Admitting: Physical Therapy

## 2024-04-03 ENCOUNTER — Encounter (HOSPITAL_BASED_OUTPATIENT_CLINIC_OR_DEPARTMENT_OTHER): Admitting: Physical Therapy

## 2024-04-07 ENCOUNTER — Encounter (HOSPITAL_BASED_OUTPATIENT_CLINIC_OR_DEPARTMENT_OTHER): Admitting: Physical Therapy

## 2024-04-07 DIAGNOSIS — E785 Hyperlipidemia, unspecified: Secondary | ICD-10-CM | POA: Diagnosis not present

## 2024-04-07 DIAGNOSIS — Z125 Encounter for screening for malignant neoplasm of prostate: Secondary | ICD-10-CM | POA: Diagnosis not present

## 2024-04-07 DIAGNOSIS — Z0000 Encounter for general adult medical examination without abnormal findings: Secondary | ICD-10-CM | POA: Diagnosis not present

## 2024-04-07 DIAGNOSIS — I10 Essential (primary) hypertension: Secondary | ICD-10-CM | POA: Diagnosis not present

## 2024-04-07 DIAGNOSIS — R7309 Other abnormal glucose: Secondary | ICD-10-CM | POA: Diagnosis not present

## 2024-04-07 DIAGNOSIS — R300 Dysuria: Secondary | ICD-10-CM | POA: Diagnosis not present

## 2024-04-10 ENCOUNTER — Ambulatory Visit (HOSPITAL_BASED_OUTPATIENT_CLINIC_OR_DEPARTMENT_OTHER): Attending: Orthopaedic Surgery | Admitting: Physical Therapy

## 2024-04-10 ENCOUNTER — Encounter (HOSPITAL_BASED_OUTPATIENT_CLINIC_OR_DEPARTMENT_OTHER): Payer: Self-pay | Admitting: Physical Therapy

## 2024-04-10 DIAGNOSIS — R2681 Unsteadiness on feet: Secondary | ICD-10-CM | POA: Insufficient documentation

## 2024-04-10 DIAGNOSIS — R600 Localized edema: Secondary | ICD-10-CM | POA: Insufficient documentation

## 2024-04-10 DIAGNOSIS — M6281 Muscle weakness (generalized): Secondary | ICD-10-CM | POA: Diagnosis not present

## 2024-04-10 DIAGNOSIS — M25572 Pain in left ankle and joints of left foot: Secondary | ICD-10-CM | POA: Diagnosis not present

## 2024-04-10 DIAGNOSIS — R2689 Other abnormalities of gait and mobility: Secondary | ICD-10-CM | POA: Insufficient documentation

## 2024-04-10 NOTE — Therapy (Signed)
 OUTPATIENT PHYSICAL THERAPY LOWER EXTREMITYTREATMENT   Patient Name: Sean Mendoza MRN: [id number] DOB:March 01, 1946, 78 y.o., male Today's Date: 04/10/2024  END OF SESSION:  PT End of Session - 04/10/24 1028     Visit Number 9    Number of Visits 24    Date for PT Re-Evaluation 04/25/24    Authorization Type [employer]IC[employer]    PT Start Time 1027    PT Stop Time 1105    PT Time Calculation (min) 38 min    Activity Tolerance Patient tolerated treatment well;No increased pain    Behavior During Therapy [facility] for tasks assessed/performed                    Past Medical History:  Diagnosis Date   Amaurosis fugax    Anxiety    Cough    wert on set 09/2010   HTN (hypertension)    Migraine    PVC's (premature ventricular contractions)    Past Surgical History:  Procedure Laterality Date   ACHILLES TENDON SURGERY Left 01/15/2024   Procedure: LEFT ACHILLES TENDON REPAIR WITH DEBRIDEMENT;  Surgeon: Ali Ink, MD;  Location: [facility];  Service: Orthopedics;  Laterality: Left;   CERVICAL DISCECTOMY     x 2   ELBOW SURGERY Left    x 3   HERNIA REPAIR     inguinal x 2   Patient Active Problem List   Diagnosis Date Noted   PVC's (premature ventricular contractions) 03/15/2023   Orthostatic lightheadedness 12/31/2020   Sleep apnea 03/19/2011   Hypertension 03/19/2011   Right bundle branch block 03/19/2011   COUGH 11/02/2010   DYSLIPIDEMIA 06/15/2010   Palpitations 06/15/2010    PCP: Ronna Coho, MD  REFERRING PROVIDER: Ali Ink, MD  REFERRING DIAG: Strain of left Achilles tendon, initial encounter [I69.629B]   THERAPY DIAG:  Pain in left ankle and joints of left foot  Other abnormalities of gait and mobility  Muscle weakness (generalized)  Rationale for Evaluation and Treatment: Rehabilitation  ONSET DATE: 01/15/2024  SUBJECTIVE:   SUBJECTIVE STATEMENT: Pt is 12 weeks and 2 days s/p s/p Left  Achilles tendon repair. Pt states he is improving.  "Each day is a little better."  Pt hasn't done much lately except walk due to his kidney stone.  Pt is having surgery for his kidney stone next Wednesday.  Pt has been performing very little of his home exercises.  Pt denies any adverse effects after prior Rx.  Pt states he did a lot of walking in [place]  for his grandson's graduation and used a cane.     Pt saw his cardiologist last week and he was dx'd with orthostatic hypotension    PERTINENT HISTORY: Left Achilles tendon open debridement with secondary repair on 01/15/2024 Anxiety, HTN, PVC's, parkinson's  Orthostatic hypotension  PAIN:  Are you having pain? Yes: NPRS scale: 0/10  Pain location: L achilles tendon Pain description: None at this time.  Aggravating factors: None  Relieving factors: Aleve   PRECAUTIONS: Other: WBAT without boot.   RED FLAGS: None   WEIGHT BEARING RESTRICTIONS: Yes WBAT without boot   FALLS:  Has patient fallen in last 6 months? No  LIVING ENVIRONMENT: Lives with: lives with their spouse Lives in: House/apartment Stairs: Yes: Internal: 14 steps; on right going up and External: 5 steps; bilateral but cannot reach both Has following equipment at home: O[employer] Blitz - 4 wheeled, shower chair, and Scooter  OCCUPATION: Retired   PLOF:  Independent  PATIENT GOALS: Pt would like to get back to golfing.   NEXT MD VISIT: 2 months.   OBJECTIVE:  Note: Objective measures were completed at Evaluation unless otherwise noted.  DIAGNOSTIC FINDINGS: None recent in Chart.    LOWER EXTREMITY ROM:  Active ROM Right eval Left eval Left 4/1 Right 4/10 Left 4/10 Left 4/17 Left 5/8  Hip flexion         Hip extension         Hip abduction         Hip adduction         Hip internal rotation         Hip external rotation         Knee flexion         Knee extension         Ankle dorsiflexion 5 Lacking 4 degrees Lacking 2 deg from neutral 17 2 deg 7 deg  10 deg  Ankle plantarflexion 40 42       Ankle inversion 20 22       Ankle eversion 25 25        (Blank rows = not tested)                                                                                                                                TREATMENT: 5/8 Scifit recumbent bike x 5 mins  Assessed ankle DF AROM Pt received L ankle DF PROM per tissue and pt tolerance Ankle DF, Inv, and Eve with RTB 3x10 each except 1 set with YTB for DF  Standing heel raises x 10, x 6 reps seated calf raise 3# ankle weight on thigh x 10, 5# ankle weight on thigh 2 x 10 Steps ups on 6 inch step 2x10   4/25  There-ex: Supine glute stretch 2x30sec each side LTR 3x10 Seated glute stretch 3x30sec each side Standing hip abd red RTB 3x10 each side Standing hip ext red RTB 3x10 Standing marches red RTB 3x10 Neuro-Re-ed  Marching on airex 3x10  4 way cone taps 3x     4/23 Manual: All PROM performed with distraction to reduce pain and improve movement Prone IASTM gastroc- focus on medial belly PROM DF  There-ex: Exercise bike warm up PF, DF, EV with yellow RTB 2x10 There-Act Sit to stand 2x8   4/19 Prone IASTM gastroc- focus on lateral belly Sidelying supination  Lunge a/p weight shift for gastroc eccentrics Small heel raise with ball bw ankles Seated hamstrings stretch Sit<>stand LSVT BIG- reviewed sequence  4/17  Reviewed pt presentation, pain level, and response to prior Rx.   Scifit recumbent bike x 5 mins Ankle DF/PF AROM w/n protocol range x10 reps.  PT educated pt in appropriate range including not going past or into tension.  Assessed ankle DF AROM.  Pt received L ankle DF/PF PROM per pt and tissue tolerance w/n  protocol range.  Ankle PF, Eve, and DF with YTB x 10, RTB x 10;   Inv YTB 2x10 seated calf raise x10, 2# ankle weight on thigh x 10, 3# ankle weight on thigh x 10  Updated HEP.  Pt received a HEP handout and was  educated in correct form and appropriate frequency.  Pt instructed he should not have pain with HEP.  PT instructed pt to have a bend in knee and not have his knee straight when performing theraband ankle exercises.      PATIENT EDUCATION:  Education details:  PT answered pt's questions.  Using ice.  Relevant anatomy, exercise form, protocol limits and restrictions, appropriate ROM, diagnosis, prognosis, HEP,  and POC.  PT informed pt he would be put on hold from PT after kidney stone surgery and he needed to get clearance from MD to return to PT.  Pt verbalized understanding and agreement.   Person educated: Patient Education method: Medical illustrator, verbal and tactile cues Education comprehension: verbalized understanding and returned demonstration, verbal and tactile cues required  HOME EXERCISE PROGRAM: Access Code: [email or web address] URL: [email or web address] Date: 02/29/2024 Prepared by: Fredia Janus  Exercises - Sidelying Hip Abduction  - 1-2 x daily - 7 x weekly - 3 sets - 10 reps - Active Straight Leg Raise with Quad Set  - 1-2 x daily - 7 x weekly - 3 sets - 10 reps - Seated Long Arc Quad  - 1-2 x daily - 7 x weekly - 3 sets - 10 reps - Supine Ankle Inversion Eversion AROM  - 1-2 x daily - 7 x weekly - 3 sets - 10 reps - Supine Ankle Dorsiflexion and Plantarflexion AROM  - 1-2 x daily - 7 x weekly - 3 sets - 10 reps  Updated HEP: - Long Sitting Ankle Plantar Flexion with Resistance  - 1 x daily - 3-4 x weekly - 2 sets - 10 reps - Long Sitting Ankle Inversion with Anchored Resistance  - 1 x daily - 3-4 x weekly - 2 sets - 10 reps - Long Sitting Ankle Eversion with Resistance  - 1 x daily - 3-4 x weekly - 2 sets - 10 reps  LSVT BIG program provided  ASSESSMENT:  CLINICAL IMPRESSION:  Pt is planning to have surgery on Wednesday to remove a kidney stone.  PT instructed pt to let PT know if any activity/exercise during Rx increases that pain and pt agreed.  Pt  reported no increased pain in the kidney stone area with PT exercises. Pt demonstrates improved L ankle DF AROM.  Pt performed exercises per protocol well with cuing and instruction in correct form.  Pt had some pain with standing heel raises and PT decreased amount of reps in the 2nd set.  Pt tolerated treatment well and had no pain after Rx.  Pt should benefit from cont skilled PT per protocol to address ongoing goals and impairments and to improve overall function.    OBJECTIVE IMPAIRMENTS: decreased activity tolerance, difficulty walking, decreased balance, decreased endurance, decreased mobility, decreased ROM, decreased strength, impaired flexibility, impaired UE/LE use, postural dysfunction, and pain.  ACTIVITY LIMITATIONS: bending, lifting, carry, locomotion, cleaning, community activity, driving, and or occupation  PERSONAL FACTORS: Anxiety, HTN, PVC's, parkinson's  are also affecting patient's functional outcome.  REHAB POTENTIAL: Good  CLINICAL DECISION MAKING: Stable/uncomplicated  EVALUATION COMPLEXITY: Low    GOALS: Short term PT Goals Target date: 03/21/2024 Pt will be I and compliant with HEP. Baseline:  Goal status: New  Pt will be able to walk without boot for 50% of the day without pain.   Long term PT goals Target date: 04/25/2024 Pt will improve ROM to [facility] to improve functional mobility Baseline: Goal status: New Pt will improve hip/knee strength to at least 5-/5 MMT to improve functional strength Baseline: Goal status: New Pt will improve LEFS to at least 9 points to to show improved function per MCID Baseline: Goal status: New Pt will reduce pain by overall 50% overall with usual activity Baseline: Goal status: New Pt will return to golf and participate in other recreational activities without complaints of pain or limitations.  Baseline: Goal status: New Pt will be able to ambulate community distances at least 1000 ft WNL gait pattern without  complaints Baseline: Goal status: New  PLAN: PT FREQUENCY: 1-2 times per week   PT DURATION: 8-12 weeks  PLANNED INTERVENTIONS (unless contraindicated): aquatic PT, Canalith repositioning, cryotherapy, Electrical stimulation, Iontophoresis with 4 mg/ml dexamethasome, Moist heat, traction, Ultrasound, gait training, Therapeutic exercise, balance training, neuromuscular re-education, patient/family education, prosthetic training, manual techniques, passive ROM, dry needling, taping, vasopnuematic device, vestibular, spinal manipulations, joint manipulations  PLAN FOR NEXT SESSION: Progress per achilles tendon repair protocol.  Pt is having a surgery to remove kidney stones and will be put on hold when he has that surgery.    Trina Fujita III PT, DPT 04/10/24 11:40 PM

## 2024-04-14 ENCOUNTER — Ambulatory Visit (HOSPITAL_BASED_OUTPATIENT_CLINIC_OR_DEPARTMENT_OTHER): Admitting: Physical Therapy

## 2024-04-15 DIAGNOSIS — N210 Calculus in bladder: Secondary | ICD-10-CM | POA: Diagnosis not present

## 2024-04-15 DIAGNOSIS — N201 Calculus of ureter: Secondary | ICD-10-CM | POA: Diagnosis not present

## 2024-04-15 DIAGNOSIS — N4289 Other specified disorders of prostate: Secondary | ICD-10-CM | POA: Diagnosis not present

## 2024-04-15 DIAGNOSIS — Z960 Presence of urogenital implants: Secondary | ICD-10-CM | POA: Diagnosis not present

## 2024-04-17 ENCOUNTER — Ambulatory Visit (HOSPITAL_BASED_OUTPATIENT_CLINIC_OR_DEPARTMENT_OTHER): Admitting: Physical Therapy

## 2024-04-17 DIAGNOSIS — J069 Acute upper respiratory infection, unspecified: Secondary | ICD-10-CM | POA: Diagnosis not present

## 2024-04-21 ENCOUNTER — Encounter (HOSPITAL_BASED_OUTPATIENT_CLINIC_OR_DEPARTMENT_OTHER): Admitting: Physical Therapy

## 2024-04-23 DIAGNOSIS — S86012A Strain of left Achilles tendon, initial encounter: Secondary | ICD-10-CM | POA: Diagnosis not present

## 2024-04-24 ENCOUNTER — Encounter (HOSPITAL_BASED_OUTPATIENT_CLINIC_OR_DEPARTMENT_OTHER): Admitting: Physical Therapy

## 2024-04-28 ENCOUNTER — Encounter (HOSPITAL_BASED_OUTPATIENT_CLINIC_OR_DEPARTMENT_OTHER): Payer: Self-pay | Admitting: Physical Therapy

## 2024-04-28 ENCOUNTER — Ambulatory Visit (HOSPITAL_BASED_OUTPATIENT_CLINIC_OR_DEPARTMENT_OTHER): Admitting: Physical Therapy

## 2024-04-28 DIAGNOSIS — M6281 Muscle weakness (generalized): Secondary | ICD-10-CM | POA: Diagnosis not present

## 2024-04-28 DIAGNOSIS — R2689 Other abnormalities of gait and mobility: Secondary | ICD-10-CM

## 2024-04-28 DIAGNOSIS — M25572 Pain in left ankle and joints of left foot: Secondary | ICD-10-CM | POA: Diagnosis not present

## 2024-04-28 DIAGNOSIS — R2681 Unsteadiness on feet: Secondary | ICD-10-CM | POA: Diagnosis not present

## 2024-04-28 DIAGNOSIS — R600 Localized edema: Secondary | ICD-10-CM | POA: Diagnosis not present

## 2024-04-28 NOTE — Therapy (Incomplete)
 OUTPATIENT PHYSICAL THERAPY LOWER EXTREMITY TREATMENT   Progress Note Reporting Period 02/29/2024 to 04/28/2024  See note below for Objective Data and Assessment of Progress/Goals.      Patient Name: Sean Mendoza MRN: [id number] DOB:01-Nov-1946, 78 y.o., male Today's Date: 04/29/2024  END OF SESSION:  PT End of Session - 04/28/24 1026     Visit Number 10    Number of Visits 22    Date for PT Re-Evaluation 06/09/24    Authorization Type [employer]IC[employer]    PT Start Time 1026    PT Stop Time 1102    PT Time Calculation (min) 36 min    Activity Tolerance Patient tolerated treatment well;No increased pain    Behavior During Therapy [facility] for tasks assessed/performed                    Past Medical History:  Diagnosis Date   Amaurosis fugax    Anxiety    Cough    wert on set 09/2010   HTN (hypertension)    Migraine    PVC's (premature ventricular contractions)    Past Surgical History:  Procedure Laterality Date   ACHILLES TENDON SURGERY Left 01/15/2024   Procedure: LEFT ACHILLES TENDON REPAIR WITH DEBRIDEMENT;  Surgeon: Ali Ink, MD;  Location: [facility];  Service: Orthopedics;  Laterality: Left;   CERVICAL DISCECTOMY     x 2   ELBOW SURGERY Left    x 3   HERNIA REPAIR     inguinal x 2   Patient Active Problem List   Diagnosis Date Noted   PVC's (premature ventricular contractions) 03/15/2023   Orthostatic lightheadedness 12/31/2020   Sleep apnea 03/19/2011   Hypertension 03/19/2011   Right bundle branch block 03/19/2011   COUGH 11/02/2010   DYSLIPIDEMIA 06/15/2010   Palpitations 06/15/2010    PCP: Ronna Coho, MD  REFERRING PROVIDER: Ali Ink, MD  REFERRING DIAG: Strain of left Achilles tendon, initial encounter [W09.811B]   THERAPY DIAG:  Pain in left ankle and joints of left foot  Muscle weakness (generalized)  Other abnormalities of gait and mobility  Rationale for Evaluation and  Treatment: Rehabilitation  ONSET DATE: 01/15/2024  SUBJECTIVE:   SUBJECTIVE STATEMENT: Pt is 14 weeks and 6 days s/p s/p Left Achilles tendon repair.  Pt had kidney stone surgery on 04/15/24.  Pt reports they didn't have to put a stent in and he has no incisions.  Pt reports he has no restrictions from the MD and doesn't have a follow up.  Pt had Covid after surgery and tested negative for Covid approx 7-8 days ago.  Pt has not been performing his home exercises due to the recent kidney surgery and having Covid.  Pt states he has felt good the past week and a half.  Pt saw Dr. Shelly Diamond last week.  Pt reports he has no restrictions.  Pt states MD informed him he could do what he feels comfortable.  Pt reports MD informed him he can return to golf, but advised him to ride the cart.  Pt states MD informed him to continue PT and follow up in 3 months.   FUNCTIONAL IMPROVEMENTS:  "Everything is better."  ambulation, washing dishes, mowing lawn, standing duration. FUNCTIONAL LIMITATIONS:  Pt had pain with ambulating a lot at his grandson's graduation.  Pt has some pain with stairs.  Pt has not played golf.         PERTINENT HISTORY: Left Achilles tendon open  debridement with secondary repair on 01/15/2024 Anxiety, HTN, PVC's, parkinson's  Orthostatic hypotension  PAIN:  Are you having pain? Yes: NPRS scale: 0/10 current, 6/10 worst  Pain location: L achilles tendon Pain description: None at this time.  Aggravating factors: None  Relieving factors: Aleve   PRECAUTIONS: Other: WBAT without boot.   RED FLAGS: None   WEIGHT BEARING RESTRICTIONS: Yes WBAT  FALLS:  Has patient fallen in last 6 months? No  LIVING ENVIRONMENT: Lives with: lives with their spouse Lives in: House/apartment Stairs: Yes: Internal: 14 steps; on right going up and External: 5 steps; bilateral but cannot reach both Has following equipment at home: [employer] Bl[employer] - 4 wheeled, shower chair, and Scooter  OCCUPATION: Retired    PLOF: Independent  PATIENT GOALS: Pt would like to get back to golfing.   NEXT MD VISIT: 2 months.   OBJECTIVE:  Note: Objective measures were completed at Evaluation unless otherwise noted.  DIAGNOSTIC FINDINGS: None recent in Chart.    LOWER EXTREMITY ROM:  Active ROM Right eval Left eval Left 4/1 Right 4/10 Left 4/10 Left 4/17 Left 5/8 Left 5/27  Hip flexion          Hip extension          Hip abduction          Hip adduction          Hip internal rotation          Hip external rotation          Knee flexion          Knee extension          Ankle dorsiflexion 5 Lacking 4 degrees Lacking 2 deg from neutral 17 2 deg 7 deg 10 deg 10  Ankle plantarflexion 40 42      58  Ankle inversion 20 22      33  Ankle eversion 25 25      16    (Blank rows = not tested)   Gait:  Pt ambulating with a good heel to toe gait pattern.  Pt has no limp.  Pt performed symmetrical bilat heel raises without pain.    PATIENT SURVEYS:  LEFS  Initial/Current:  51/80 / 46/80                                                                                                                              TREATMENT: 5/27  Reviewed current function, pain levels, pt presentation, and HEP compliance.  Assessed gait, heel raise, and ROM.  See above Pt completed LEFS.  See above Pt received L ankle DF PROM per pt and tissue tolerance   5/8 Scifit recumbent bike x 5 mins  Assessed ankle DF AROM Pt received L ankle DF PROM per tissue and pt tolerance Ankle DF, Inv, and Eve with RTB 3x10 each except 1 set with YTB for DF  Standing heel raises x 10, x 6 reps seated calf  raise 3# ankle weight on thigh x 10, 5# ankle weight on thigh 2 x 10 Steps ups on 6 inch step 2x10   4/25  There-ex: Supine glute stretch 2x30sec each side LTR 3x10 Seated glute stretch 3x30sec each side Standing hip abd red RTB 3x10 each side Standing hip ext red RTB 3x10 Standing marches red RTB  3x10 Neuro-Re-ed  Marching on airex 3x10  4 way cone taps 3x     4/23 Manual: All PROM performed with distraction to reduce pain and improve movement Prone IASTM gastroc- focus on medial belly PROM DF  There-ex: Exercise bike warm up PF, DF, EV with yellow RTB 2x10 There-Act Sit to stand 2x8   4/19 Prone IASTM gastroc- focus on lateral belly Sidelying supination  Lunge a/p weight shift for gastroc eccentrics Small heel raise with ball bw ankles Seated hamstrings stretch Sit<>stand LSVT BIG- reviewed sequence  4/17  Reviewed pt presentation, pain level, and response to prior Rx.   Scifit recumbent bike x 5 mins Ankle DF/PF AROM w/n protocol range x10 reps.  PT educated pt in appropriate range including not going past or into tension.  Assessed ankle DF AROM.  Pt received L ankle DF/PF PROM per pt and tissue tolerance w/n protocol range.  Ankle PF, Eve, and DF with YTB x 10, RTB x 10;   Inv YTB 2x10 seated calf raise x10, 2# ankle weight on thigh x 10, 3# ankle weight on thigh x 10  Updated HEP.  Pt received a HEP handout and was educated in correct form and appropriate frequency.  Pt instructed he should not have pain with HEP.  PT instructed pt to have a bend in knee and not have his knee straight when performing theraband ankle exercises.      PATIENT EDUCATION:  Education details:  PT answered pt's questions.  Relevant anatomy, objective findings, goal progress, protocol limits and restrictions, diagnosis, HEP,  and POC.     Person educated: Patient Education method: Medical illustrator, verbal and tactile cues Education comprehension: verbalized understanding and returned demonstration, verbal and tactile cues required  HOME EXERCISE PROGRAM: Access Code: [email or web address] URL: [email or web address] Date: 02/29/2024 Prepared by: Fredia Janus  Exercises - Sidelying Hip Abduction  - 1-2 x daily - 7 x weekly - 3 sets -  10 reps - Active Straight Leg Raise with Quad Set  - 1-2 x daily - 7 x weekly - 3 sets - 10 reps - Seated Long Arc Quad  - 1-2 x daily - 7 x weekly - 3 sets - 10 reps - Supine Ankle Inversion Eversion AROM  - 1-2 x daily - 7 x weekly - 3 sets - 10 reps - Supine Ankle Dorsiflexion and Plantarflexion AROM  - 1-2 x daily - 7 x weekly - 3 sets - 10 reps  Updated HEP: - Long Sitting Ankle Plantar Flexion with Resistance  - 1 x daily - 3-4 x weekly - 2 sets - 10 reps - Long Sitting Ankle Inversion with Anchored Resistance  - 1 x daily - 3-4 x weekly - 2 sets - 10 reps - Long Sitting Ankle Eversion with Resistance  - 1 x daily - 3-4 x weekly - 2 sets - 10 reps  LSVT BIG program provided  ASSESSMENT:  CLINICAL IMPRESSION:  Pt returns to PT after having a kidney stone removed and also having Covid.  PT completed a PN today.  Though pt has other medical issues recently,  pt is making good progress.  He reports much improved sx's over the past week and a half.  Pt has improved with gait and is improving with ambulation.  He has increased pain with ambulating extended distance.  Pt has improved tolerance with activities including standing to wash dishes and mowing his lawn.  Pt has some pain with stairs and is unable to play golf.  He has improved with ankle AROM and strength.  Pt is progressing appropriately with protocol.  Pt tolerated treatment well and had no c/o's after Rx.  Pt met STG #2 and LTG's #1,4 and partially met STG #1.  Pt should benefit from cont skilled PT per protocol to address ongoing goals and impairments and to improve overall function.    OBJECTIVE IMPAIRMENTS: decreased activity tolerance, difficulty walking, decreased balance, decreased endurance, decreased mobility, decreased ROM, decreased strength, impaired flexibility, impaired UE/LE use, postural dysfunction, and pain.  ACTIVITY LIMITATIONS: bending, lifting, carry, locomotion, cleaning, community activity, driving, and or  occupation  PERSONAL FACTORS: Anxiety, HTN, PVC's, parkinson's  are also affecting patient's functional outcome.  REHAB POTENTIAL: Good  CLINICAL DECISION MAKING: Stable/uncomplicated  EVALUATION COMPLEXITY: Low    GOALS: Short term PT Goals Target date: 03/21/2024 Pt will be I and compliant with HEP. Baseline:  Goal status: PARTIALLY MET   Pt will be able to walk without boot for 50% of the day without pain.        Goal status:  GOAL MET  Long term PT goals Target date: 06/09/2024 Pt will improve ROM to [facility] to improve functional mobility Baseline: Goal status: GOAL MET  5/27 Pt will improve hip/knee strength to at least 5-/5 MMT to improve functional strength Baseline: Goal status: ONGOING Pt will improve LEFS to at least 9 points to to show improved function per MCID Baseline: Goal status: NOT MET Pt will reduce pain by overall 50% overall with usual activity Baseline: Goal status: GOAL MET  5/27 Pt will return to golf and participate in other recreational activities without complaints of pain or limitations.  Baseline: Goal status: ONGOING Pt will be able to ambulate community distances at least 1000 ft WNL gait pattern without complaints Baseline: Goal status: ONGOING         7.  Pt will ascend and descend stairs with a reciprocal gait with the rail.   Goal status:  INITIAL  PLAN: PT FREQUENCY:  2 times per week   PT DURATION:  6 weeks  PLANNED INTERVENTIONS (unless contraindicated): aquatic PT, Canalith repositioning, cryotherapy, Electrical stimulation, Iontophoresis with 4 mg/ml dexamethasome, Moist heat, traction, Ultrasound, gait training, Therapeutic exercise, balance training, neuromuscular re-education, patient/family education, prosthetic training, manual techniques, passive ROM, dry needling, taping, vasopnuematic device, vestibular, spinal manipulations, joint manipulations  PLAN FOR NEXT SESSION: Progress per achilles tendon repair protocol.     Trina Fujita III PT, DPT 04/29/24 1:36 PM

## 2024-05-01 ENCOUNTER — Encounter (HOSPITAL_BASED_OUTPATIENT_CLINIC_OR_DEPARTMENT_OTHER): Payer: Self-pay

## 2024-05-01 ENCOUNTER — Ambulatory Visit (HOSPITAL_BASED_OUTPATIENT_CLINIC_OR_DEPARTMENT_OTHER)

## 2024-05-01 DIAGNOSIS — R2681 Unsteadiness on feet: Secondary | ICD-10-CM

## 2024-05-01 DIAGNOSIS — R2689 Other abnormalities of gait and mobility: Secondary | ICD-10-CM

## 2024-05-01 DIAGNOSIS — R600 Localized edema: Secondary | ICD-10-CM

## 2024-05-01 DIAGNOSIS — M25572 Pain in left ankle and joints of left foot: Secondary | ICD-10-CM

## 2024-05-01 DIAGNOSIS — M6281 Muscle weakness (generalized): Secondary | ICD-10-CM

## 2024-05-01 NOTE — Therapy (Signed)
 OUTPATIENT PHYSICAL THERAPY LOWER EXTREMITY TREATMENT       Patient Name: Sean Mendoza MRN: [id number] DOB:January 31, 1946, 78 y.o., male Today's Date: 05/01/2024  END OF SESSION:  PT End of Session - 05/01/24 1112     Visit Number 11    Number of Visits 22    Date for PT Re-Evaluation 06/09/24    Authorization Type [employer]    PT Start Time 1015    PT Stop Time 1100    PT Time Calculation (min) 45 min    Activity Tolerance Patient tolerated treatment well    Behavior During Therapy WFL for tasks assessed/performed                     Past Medical History:  Diagnosis Date   Amaurosis fugax    Anxiety    Cough    wert on set 09/2010   HTN (hypertension)    Migraine    PVC's (premature ventricular contractions)    Past Surgical History:  Procedure Laterality Date   ACHILLES TENDON SURGERY Left 01/15/2024   Procedure: LEFT ACHILLES TENDON REPAIR WITH DEBRIDEMENT;  Surgeon: Ali Ink, MD;  Location: [facility];  Service: Orthopedics;  Laterality: Left;   CERVICAL DISCECTOMY     x 2   ELBOW SURGERY Left    x 3   HERNIA REPAIR     inguinal x 2   Patient Active Problem List   Diagnosis Date Noted   PVC's (premature ventricular contractions) 03/15/2023   Orthostatic lightheadedness 12/31/2020   Sleep apnea 03/19/2011   Hypertension 03/19/2011   Right bundle branch block 03/19/2011   COUGH 11/02/2010   DYSLIPIDEMIA 06/15/2010   Palpitations 06/15/2010    PCP: Ronna Coho, MD  REFERRING PROVIDER: Ali Ink, MD  REFERRING DIAG: Strain of left Achilles tendon, initial encounter [D66.440H]   THERAPY DIAG:  Pain in left ankle and joints of left foot  Muscle weakness (generalized)  Other abnormalities of gait and mobility  Unsteadiness on feet  Localized edema  Rationale for Evaluation and Treatment: Rehabilitation  ONSET DATE: 01/15/2024  SUBJECTIVE:   SUBJECTIVE STATEMENT: Pt  reports no pain at entry. Has pain after prolonged sitting and then perofrming activity.         PERTINENT HISTORY: Left Achilles tendon open debridement with secondary repair on 01/15/2024 Anxiety, HTN, PVC's, parkinson's  Orthostatic hypotension  PAIN:  Are you having pain? Yes: NPRS scale: 0/10 current, 6/10 worst  Pain location: L achilles tendon Pain description: None at this time.  Aggravating factors: None  Relieving factors: Aleve   PRECAUTIONS: Other: WBAT without boot.   RED FLAGS: None   WEIGHT BEARING RESTRICTIONS: Yes WBAT  FALLS:  Has patient fallen in last 6 months? No  LIVING ENVIRONMENT: Lives with: lives with their spouse Lives in: House/apartment Stairs: Yes: Internal: 14 steps; on right going up and External: 5 steps; bilateral but cannot reach both Has following equipment at home: [employer] - 4 wheeled, shower chair, and Scooter  OCCUPATION: Retired   PLOF: Independent  PATIENT GOALS: Pt would like to get back to golfing.   NEXT MD VISIT: 2 months.   OBJECTIVE:  Note: Objective measures were completed at Evaluation unless otherwise noted.  DIAGNOSTIC FINDINGS: None recent in Chart.    LOWER EXTREMITY ROM:  Active ROM Right eval Left eval Left 4/1 Right 4/10 Left 4/10 Left 4/17 Left 5/8 Left 5/27  Hip flexion  Hip extension          Hip abduction          Hip adduction          Hip internal rotation          Hip external rotation          Knee flexion          Knee extension          Ankle dorsiflexion 5 Lacking 4 degrees Lacking 2 deg from neutral 17 2 deg 7 deg 10 deg 10  Ankle plantarflexion 40 42      58  Ankle inversion 20 22      33  Ankle eversion 25 25      16    (Blank rows = not tested)   Gait:  Pt ambulating with a good heel to toe gait pattern.  Pt has no limp.  Pt performed symmetrical bilat heel raises without pain.    PATIENT SURVEYS:  LEFS  Initial/Current:  51/80 / 46/80                                                                                                                               TREATMENT:   5/30 Prone DF stretching with STM to achilles/distal triceps surae SLS on floor (shoe doffed) 2x30sec SLS on airex 2x30sec (shoe doffed) Marching on airex (slow) x10 Runner step ups 8" 2x10L Eccentric step down fwd and lateral 4" 2x10ea HR bil 2x10 Eccentric HR 2x10   5/27  Reviewed current function, pain levels, pt presentation, and HEP compliance.  Assessed gait, heel raise, and ROM.  See above Pt completed LEFS.  See above Pt received L ankle DF PROM per pt and tissue tolerance   5/8 Scifit recumbent bike x 5 mins  Assessed ankle DF AROM Pt received L ankle DF PROM per tissue and pt tolerance Ankle DF, Inv, and Eve with RTB 3x10 each except 1 set with YTB for DF  Standing heel raises x 10, x 6 reps seated calf raise 3# ankle weight on thigh x 10, 5# ankle weight on thigh 2 x 10 Steps ups on 6 inch step 2x10   4/25  There-ex: Supine glute stretch 2x30sec each side LTR 3x10 Seated glute stretch 3x30sec each side Standing hip abd red RTB 3x10 each side Standing hip ext red RTB 3x10 Standing marches red RTB 3x10 Neuro-Re-ed  Marching on airex 3x10  4 way cone taps 3x     4/23 Manual: All PROM performed with distraction to reduce pain and improve movement Prone IASTM gastroc- focus on medial belly PROM DF  There-ex: Exercise bike warm up PF, DF, EV with yellow RTB 2x10 There-Act Sit to stand 2x8   4/19 Prone IASTM gastroc- focus on lateral belly Sidelying supination  Lunge a/p weight shift for gastroc eccentrics Small heel raise with ball bw ankles Seated hamstrings  stretch Sit<>stand LSVT BIG- reviewed sequence  4/17  Reviewed pt presentation, pain level, and response to prior Rx.   Scifit recumbent bike x 5 mins Ankle DF/PF AROM w/n protocol range x10 reps.  PT educated pt in appropriate range  including not going past or into tension.  Assessed ankle DF AROM.  Pt received L ankle DF/PF PROM per pt and tissue tolerance w/n protocol range.  Ankle PF, Eve, and DF with YTB x 10, RTB x 10;   Inv YTB 2x10 seated calf raise x10, 2# ankle weight on thigh x 10, 3# ankle weight on thigh x 10  Updated HEP.  Pt received a HEP handout and was educated in correct form and appropriate frequency.  Pt instructed he should not have pain with HEP.  PT instructed pt to have a bend in knee and not have his knee straight when performing theraband ankle exercises.      PATIENT EDUCATION:  Education details:  PT answered pt's questions.  Relevant anatomy, objective findings, goal progress, protocol limits and restrictions, diagnosis, HEP,  and POC.     Person educated: Patient Education method: Medical illustrator, verbal and tactile cues Education comprehension: verbalized understanding and returned demonstration, verbal and tactile cues required  HOME EXERCISE PROGRAM: Access Code: [email or web address] URL: [email or web address] Date: 02/29/2024 Prepared by: Fredia Janus  Exercises - Sidelying Hip Abduction  - 1-2 x daily - 7 x weekly - 3 sets - 10 reps - Active Straight Leg Raise with Quad Set  - 1-2 x daily - 7 x weekly - 3 sets - 10 reps - Seated Long Arc Quad  - 1-2 x daily - 7 x weekly - 3 sets - 10 reps - Supine Ankle Inversion Eversion AROM  - 1-2 x daily - 7 x weekly - 3 sets - 10 reps - Supine Ankle Dorsiflexion and Plantarflexion AROM  - 1-2 x daily - 7 x weekly - 3 sets - 10 reps  Updated HEP: - Long Sitting Ankle Plantar Flexion with Resistance  - 1 x daily - 3-4 x weekly - 2 sets - 10 reps - Long Sitting Ankle Inversion with Anchored Resistance  - 1 x daily - 3-4 x weekly - 2 sets - 10 reps - Long Sitting Ankle Eversion with Resistance  - 1 x daily - 3-4 x weekly - 2 sets - 10 reps  LSVT BIG program provided  ASSESSMENT:  CLINICAL IMPRESSION:  Minimal  restrictions felt with passive DF and pt felt benefit from end range stretching here. Also performed STM over achilles area which pt reported as helpful. Worked on balance and stability along with strengthening. Initiated gentle eccentric calf raises in standing without c/o pain or discomfort.Pt progressing well towards goals. Per pt, MD said he can begin elliptical and returning to golf. Will benefit from additional stability/strength training.    OBJECTIVE IMPAIRMENTS: decreased activity tolerance, difficulty walking, decreased balance, decreased endurance, decreased mobility, decreased ROM, decreased strength, impaired flexibility, impaired UE/LE use, postural dysfunction, and pain.  ACTIVITY LIMITATIONS: bending, lifting, carry, locomotion, cleaning, community activity, driving, and or occupation  PERSONAL FACTORS: Anxiety, HTN, PVC's, parkinson's  are also affecting patient's functional outcome.  REHAB POTENTIAL: Good  CLINICAL DECISION MAKING: Stable/uncomplicated  EVALUATION COMPLEXITY: Low    GOALS: Short term PT Goals Target date: 03/21/2024 Pt will be I and compliant with HEP. Baseline:  Goal status: PARTIALLY MET   Pt will be able to walk without boot for 50% of the day without pain.  Goal status:  GOAL MET  Long term PT goals Target date: 06/09/2024 Pt will improve ROM to [facility] to improve functional mobility Baseline: Goal status: GOAL MET  5/27 Pt will improve hip/knee strength to at least 5-/5 MMT to improve functional strength Baseline: Goal status: ONGOING Pt will improve LEFS to at least 9 points to to show improved function per MCID Baseline: Goal status: NOT MET Pt will reduce pain by overall 50% overall with usual activity Baseline: Goal status: GOAL MET  5/27 Pt will return to golf and participate in other recreational activities without complaints of pain or limitations.  Baseline: Goal status: ONGOING Pt will be able to ambulate community distances  at least 1000 ft WNL gait pattern without complaints Baseline: Goal status: ONGOING         7.  Pt will ascend and descend stairs with a reciprocal gait with the rail.   Goal status:  INITIAL  PLAN: PT FREQUENCY:  2 times per week   PT DURATION:  6 weeks  PLANNED INTERVENTIONS (unless contraindicated): aquatic PT, Canalith repositioning, cryotherapy, Electrical stimulation, Iontophoresis with 4 mg/ml dexamethasome, Moist heat, traction, Ultrasound, gait training, Therapeutic exercise, balance training, neuromuscular re-education, patient/family education, prosthetic training, manual techniques, passive ROM, dry needling, taping, vasopnuematic device, vestibular, spinal manipulations, joint manipulations  PLAN FOR NEXT SESSION: Progress per achilles tendon repair protocol.     Herb Loges, PTA  05/01/24 11:32 AM

## 2024-05-04 ENCOUNTER — Ambulatory Visit (HOSPITAL_BASED_OUTPATIENT_CLINIC_OR_DEPARTMENT_OTHER): Admitting: Physical Therapy

## 2024-05-04 ENCOUNTER — Ambulatory Visit (HOSPITAL_BASED_OUTPATIENT_CLINIC_OR_DEPARTMENT_OTHER): Attending: Orthopaedic Surgery

## 2024-05-04 ENCOUNTER — Encounter (HOSPITAL_BASED_OUTPATIENT_CLINIC_OR_DEPARTMENT_OTHER): Payer: Self-pay

## 2024-05-04 DIAGNOSIS — R2681 Unsteadiness on feet: Secondary | ICD-10-CM | POA: Diagnosis not present

## 2024-05-04 DIAGNOSIS — M6281 Muscle weakness (generalized): Secondary | ICD-10-CM | POA: Insufficient documentation

## 2024-05-04 DIAGNOSIS — R600 Localized edema: Secondary | ICD-10-CM | POA: Diagnosis not present

## 2024-05-04 DIAGNOSIS — R2689 Other abnormalities of gait and mobility: Secondary | ICD-10-CM | POA: Diagnosis not present

## 2024-05-04 DIAGNOSIS — M25572 Pain in left ankle and joints of left foot: Secondary | ICD-10-CM | POA: Diagnosis not present

## 2024-05-04 NOTE — Therapy (Signed)
 OUTPATIENT PHYSICAL THERAPY LOWER EXTREMITY TREATMENT       Patient Name: Sean Mendoza MRN: [id number] DOB:[birth year], 78 y.o., male Today's Date: 05/04/2024  END OF SESSION:  PT End of Session - 05/04/24 1325     Visit Number 12    Number of Visits 22    Date for PT Re-Evaluation 06/09/24    Authorization Type [employer]    PT Start Time 1348    PT Stop Time 1426    PT Time Calculation (min) 38 min    Activity Tolerance Patient tolerated treatment well    Behavior During Therapy WFL for tasks assessed/performed                      Past Medical History:  Diagnosis Date   Amaurosis fugax    Anxiety    Cough    wert on set 09/2010   HTN (hypertension)    Migraine    PVC's (premature ventricular contractions)    Past Surgical History:  Procedure Laterality Date   ACHILLES TENDON SURGERY Left 01/15/2024   Procedure: LEFT ACHILLES TENDON REPAIR WITH DEBRIDEMENT;  Surgeon: Ali Ink, MD;  Location: [facility];  Service: Orthopedics;  Laterality: Left;   CERVICAL DISCECTOMY     x 2   ELBOW SURGERY Left    x 3   HERNIA REPAIR     inguinal x 2   Patient Active Problem List   Diagnosis Date Noted   PVC's (premature ventricular contractions) 03/15/2023   Orthostatic lightheadedness 12/31/2020   Sleep apnea 03/19/2011   Hypertension 03/19/2011   Right bundle branch block 03/19/2011   COUGH 11/02/2010   DYSLIPIDEMIA 06/15/2010   Palpitations 06/15/2010    PCP: Ronna Coho, MD  REFERRING PROVIDER: Ali Ink, MD  REFERRING DIAG: Strain of left Achilles tendon, initial encounter [X52.841L]   THERAPY DIAG:  Unsteadiness on feet  Other abnormalities of gait and mobility  Muscle weakness (generalized)  Pain in left ankle and joints of left foot  Localized edema  Rationale for Evaluation and Treatment: Rehabilitation  ONSET DATE: 01/15/2024  SUBJECTIVE:   SUBJECTIVE STATEMENT: Pt  reports no pain at entry. Has pain after prolonged sitting and then perofrming activity.         PERTINENT HISTORY: Left Achilles tendon open debridement with secondary repair on 01/15/2024 Anxiety, HTN, PVC's, parkinson's  Orthostatic hypotension  PAIN:  Are you having pain? Yes: NPRS scale: 0/10 current, 6/10 worst  Pain location: L achilles tendon Pain description: None at this time.  Aggravating factors: None  Relieving factors: Aleve   PRECAUTIONS: Other: WBAT without boot.   RED FLAGS: None   WEIGHT BEARING RESTRICTIONS: Yes WBAT  FALLS:  Has patient fallen in last 6 months? No  LIVING ENVIRONMENT: Lives with: lives with their spouse Lives in: House/apartment Stairs: Yes: Internal: 14 steps; on right going up and External: 5 steps; bilateral but cannot reach both Has following equipment at home: [employer] - 4 wheeled, shower chair, and Scooter  OCCUPATION: Retired   PLOF: Independent  PATIENT GOALS: Pt would like to get back to golfing.   NEXT MD VISIT: 2 months.   OBJECTIVE:  Note: Objective measures were completed at Evaluation unless otherwise noted.  DIAGNOSTIC FINDINGS: None recent in Chart.    LOWER EXTREMITY ROM:  Active ROM Right eval Left eval Left 4/1 Right 4/10 Left 4/10 Left 4/17 Left 5/8 Left 5/27  Hip flexion  Hip extension          Hip abduction          Hip adduction          Hip internal rotation          Hip external rotation          Knee flexion          Knee extension          Ankle dorsiflexion 5 Lacking 4 degrees Lacking 2 deg from neutral 17 2 deg 7 deg 10 deg 10  Ankle plantarflexion 40 42      58  Ankle inversion 20 22      33  Ankle eversion 25 25      16    (Blank rows = not tested)   Gait:  Pt ambulating with a good heel to toe gait pattern.  Pt has no limp.  Pt performed symmetrical bilat heel raises without pain.    PATIENT SURVEYS:  LEFS  Initial/Current:  51/80 / 46/80                                                                                                                               TREATMENT:   6/2 Elliptical L3 x41min Prone DF stretching with STM to achilles/distal triceps surae SLS on floor (shoe doffed) 2x30sec SL hinge x6 Reverse lunges at rail x10L Marching on airex (slow) 2x10 Runner step ups 8" 2x10L Eccentric step down fwd and lateral 4" 2x10ea Sit to stands 2x10  5/30 Prone DF stretching with STM to achilles/distal triceps surae SLS on floor (shoe doffed) 2x30sec SLS on airex 2x30sec (shoe doffed) Marching on airex (slow) x10 Runner step ups 8" 2x10L Eccentric step down fwd and lateral 4" 2x10ea    5/27  Reviewed current function, pain levels, pt presentation, and HEP compliance.  Assessed gait, heel raise, and ROM.  See above Pt completed LEFS.  See above Pt received L ankle DF PROM per pt and tissue tolerance   5/8 Scifit recumbent bike x 5 mins  Assessed ankle DF AROM Pt received L ankle DF PROM per tissue and pt tolerance Ankle DF, Inv, and Eve with RTB 3x10 each except 1 set with YTB for DF  Standing heel raises x 10, x 6 reps seated calf raise 3# ankle weight on thigh x 10, 5# ankle weight on thigh 2 x 10 Steps ups on 6 inch step 2x10   4/25  There-ex: Supine glute stretch 2x30sec each side LTR 3x10 Seated glute stretch 3x30sec each side Standing hip abd red RTB 3x10 each side Standing hip ext red RTB 3x10 Standing marches red RTB 3x10 Neuro-Re-ed  Marching on airex 3x10  4 way cone taps 3x     4/23 Manual: All PROM performed with distraction to reduce pain and improve movement Prone IASTM gastroc- focus on medial belly PROM DF  There-ex: Exercise  bike warm up PF, DF, EV with yellow RTB 2x10 There-Act Sit to stand 2x8   4/19 Prone IASTM gastroc- focus on lateral belly Sidelying supination  Lunge a/p weight shift for gastroc eccentrics Small heel raise with ball bw  ankles Seated hamstrings stretch Sit<>stand LSVT BIG- reviewed sequence  4/17  Reviewed pt presentation, pain level, and response to prior Rx.   Scifit recumbent bike x 5 mins Ankle DF/PF AROM w/n protocol range x10 reps.  PT educated pt in appropriate range including not going past or into tension.  Assessed ankle DF AROM.  Pt received L ankle DF/PF PROM per pt and tissue tolerance w/n protocol range.  Ankle PF, Eve, and DF with YTB x 10, RTB x 10;   Inv YTB 2x10 seated calf raise x10, 2# ankle weight on thigh x 10, 3# ankle weight on thigh x 10  Updated HEP.  Pt received a HEP handout and was educated in correct form and appropriate frequency.  Pt instructed he should not have pain with HEP.  PT instructed pt to have a bend in knee and not have his knee straight when performing theraband ankle exercises.      PATIENT EDUCATION:  Education details:  PT answered pt's questions.  Relevant anatomy, objective findings, goal progress, protocol limits and restrictions, diagnosis, HEP,  and POC.     Person educated: Patient Education method: Medical illustrator, verbal and tactile cues Education comprehension: verbalized understanding and returned demonstration, verbal and tactile cues required  HOME EXERCISE PROGRAM: Access Code: [email or web address] URL: [email or web address] Date: 02/29/2024 Prepared by: Fredia Janus  Exercises - Sidelying Hip Abduction  - 1-2 x daily - 7 x weekly - 3 sets - 10 reps - Active Straight Leg Raise with Quad Set  - 1-2 x daily - 7 x weekly - 3 sets - 10 reps - Seated Long Arc Quad  - 1-2 x daily - 7 x weekly - 3 sets - 10 reps - Supine Ankle Inversion Eversion AROM  - 1-2 x daily - 7 x weekly - 3 sets - 10 reps - Supine Ankle Dorsiflexion and Plantarflexion AROM  - 1-2 x daily - 7 x weekly - 3 sets - 10 reps  Updated HEP: - Long Sitting Ankle Plantar Flexion with Resistance  - 1 x daily - 3-4 x weekly - 2 sets - 10 reps - Long Sitting  Ankle Inversion with Anchored Resistance  - 1 x daily - 3-4 x weekly - 2 sets - 10 reps - Long Sitting Ankle Eversion with Resistance  - 1 x daily - 3-4 x weekly - 2 sets - 10 reps  LSVT BIG program provided  ASSESSMENT:  CLINICAL IMPRESSION:  Pt with increased soreness in achilles area, so held standard and eccentric HR to prevent additional discomfort. Pt with notable quad weakness which limited him with eccentric fwd step downs. Also fatigued with quads with lunges and SL hinges. Pt struggled initially with SLS on floor, but improved after first round. Will monitor pain level and progress as tolerated next visit.    OBJECTIVE IMPAIRMENTS: decreased activity tolerance, difficulty walking, decreased balance, decreased endurance, decreased mobility, decreased ROM, decreased strength, impaired flexibility, impaired UE/LE use, postural dysfunction, and pain.  ACTIVITY LIMITATIONS: bending, lifting, carry, locomotion, cleaning, community activity, driving, and or occupation  PERSONAL FACTORS: Anxiety, HTN, PVC's, parkinson's  are also affecting patient's functional outcome.  REHAB POTENTIAL: Good  CLINICAL DECISION MAKING: Stable/uncomplicated  EVALUATION COMPLEXITY: Low    GOALS: Short  term PT Goals Target date: 03/21/2024 Pt will be I and compliant with HEP. Baseline:  Goal status: PARTIALLY MET   Pt will be able to walk without boot for 50% of the day without pain.        Goal status:  GOAL MET  Long term PT goals Target date: 06/09/2024 Pt will improve ROM to [facility] to improve functional mobility Baseline: Goal status: GOAL MET  5/27 Pt will improve hip/knee strength to at least 5-/5 MMT to improve functional strength Baseline: Goal status: ONGOING Pt will improve LEFS to at least 9 points to to show improved function per MCID Baseline: Goal status: NOT MET Pt will reduce pain by overall 50% overall with usual activity Baseline: Goal status: GOAL MET  5/27 Pt will return to  golf and participate in other recreational activities without complaints of pain or limitations.  Baseline: Goal status: ONGOING Pt will be able to ambulate community distances at least 1000 ft WNL gait pattern without complaints Baseline: Goal status: ONGOING         7.  Pt will ascend and descend stairs with a reciprocal gait with the rail.   Goal status:  INITIAL  PLAN: PT FREQUENCY:  2 times per week   PT DURATION:  6 weeks  PLANNED INTERVENTIONS (unless contraindicated): aquatic PT, Canalith repositioning, cryotherapy, Electrical stimulation, Iontophoresis with 4 mg/ml dexamethasome, Moist heat, traction, Ultrasound, gait training, Therapeutic exercise, balance training, neuromuscular re-education, patient/family education, prosthetic training, manual techniques, passive ROM, dry needling, taping, vasopnuematic device, vestibular, spinal manipulations, joint manipulations  PLAN FOR NEXT SESSION: Progress per achilles tendon repair protocol.     Herb Loges, PTA  05/04/24 2:32 PM

## 2024-05-12 ENCOUNTER — Ambulatory Visit (HOSPITAL_BASED_OUTPATIENT_CLINIC_OR_DEPARTMENT_OTHER): Payer: Self-pay

## 2024-05-12 ENCOUNTER — Encounter (HOSPITAL_BASED_OUTPATIENT_CLINIC_OR_DEPARTMENT_OTHER): Payer: Self-pay

## 2024-05-12 DIAGNOSIS — R2689 Other abnormalities of gait and mobility: Secondary | ICD-10-CM | POA: Diagnosis not present

## 2024-05-12 DIAGNOSIS — R2681 Unsteadiness on feet: Secondary | ICD-10-CM | POA: Diagnosis not present

## 2024-05-12 DIAGNOSIS — M6281 Muscle weakness (generalized): Secondary | ICD-10-CM | POA: Diagnosis not present

## 2024-05-12 DIAGNOSIS — R600 Localized edema: Secondary | ICD-10-CM

## 2024-05-12 DIAGNOSIS — M25572 Pain in left ankle and joints of left foot: Secondary | ICD-10-CM | POA: Diagnosis not present

## 2024-05-12 NOTE — Therapy (Signed)
 OUTPATIENT PHYSICAL THERAPY LOWER EXTREMITY TREATMENT       Patient Name: Sean Mendoza MRN: [id number] DOB:[birth year], 78 y.o., male Today's Date: 05/12/2024  END OF SESSION:  PT End of Session - 05/12/24 1023     Visit Number 13    Number of Visits 22    Date for PT Re-Evaluation 06/09/24    Authorization Type [employer]    PT Start Time 1019    PT Stop Time 1100    PT Time Calculation (min) 41 min    Activity Tolerance Patient tolerated treatment well    Behavior During Therapy WFL for tasks assessed/performed                       Past Medical History:  Diagnosis Date   Amaurosis fugax    Anxiety    Cough    wert on set 09/2010   HTN (hypertension)    Migraine    PVC's (premature ventricular contractions)    Past Surgical History:  Procedure Laterality Date   ACHILLES TENDON SURGERY Left 01/15/2024   Procedure: LEFT ACHILLES TENDON REPAIR WITH DEBRIDEMENT;  Surgeon: Ali Ink, MD;  Location: [facility];  Service: Orthopedics;  Laterality: Left;   CERVICAL DISCECTOMY     x 2   ELBOW SURGERY Left    x 3   HERNIA REPAIR     inguinal x 2   Patient Active Problem List   Diagnosis Date Noted   PVC's (premature ventricular contractions) 03/15/2023   Orthostatic lightheadedness 12/31/2020   Sleep apnea 03/19/2011   Hypertension 03/19/2011   Right bundle branch block 03/19/2011   COUGH 11/02/2010   DYSLIPIDEMIA 06/15/2010   Palpitations 06/15/2010    PCP: Ronna Coho, MD  REFERRING PROVIDER: Ali Ink, MD  REFERRING DIAG: Strain of left Achilles tendon, initial encounter [W09.811B]   THERAPY DIAG:  Muscle weakness (generalized)  Localized edema  Pain in left ankle and joints of left foot  Other abnormalities of gait and mobility  Unsteadiness on feet  Rationale for Evaluation and Treatment: Rehabilitation  ONSET DATE: 01/15/2024  SUBJECTIVE:   SUBJECTIVE STATEMENT: Pt  reports his achilles has been sore the past few days. Worked out yesterday but did mostly upper body. Did elliptical yesterday with no resistance which  did bother him. He cleaned out his daughter's garage this weekend.         PERTINENT HISTORY: Left Achilles tendon open debridement with secondary repair on 01/15/2024 Anxiety, HTN, PVC's, parkinson's  Orthostatic hypotension  PAIN:  Are you having pain? Yes: NPRS scale: 0/10 current, 6/10 worst  Pain location: L achilles tendon Pain description: None at this time.  Aggravating factors: None  Relieving factors: Aleve   PRECAUTIONS: Other: WBAT without boot.   RED FLAGS: None   WEIGHT BEARING RESTRICTIONS: Yes WBAT  FALLS:  Has patient fallen in last 6 months? No  LIVING ENVIRONMENT: Lives with: lives with their spouse Lives in: House/apartment Stairs: Yes: Internal: 14 steps; on right going up and External: 5 steps; bilateral but cannot reach both Has following equipment at home: [employer] Bl[employer] - 4 wheeled, shower chair, and Scooter  OCCUPATION: Retired   PLOF: Independent  PATIENT GOALS: Pt would like to get back to golfing.   NEXT MD VISIT: 2 months.   OBJECTIVE:  Note: Objective measures were completed at Evaluation unless otherwise noted.  DIAGNOSTIC FINDINGS: None recent in Chart.    LOWER EXTREMITY ROM:  Active ROM  Right eval Left eval Left 4/1 Right 4/10 Left 4/10 Left 4/17 Left 5/8 Left 5/27  Hip flexion          Hip extension          Hip abduction          Hip adduction          Hip internal rotation          Hip external rotation          Knee flexion          Knee extension          Ankle dorsiflexion 5 Lacking 4 degrees Lacking 2 deg from neutral 17 2 deg 7 deg 10 deg 10  Ankle plantarflexion 40 42      58  Ankle inversion 20 22      33  Ankle eversion 25 25      16    (Blank rows = not tested)   Gait:  Pt ambulating with a good heel to toe gait pattern.  Pt has no limp.  Pt performed  symmetrical bilat heel raises without pain.    PATIENT SURVEYS:  LEFS  Initial/Current:  51/80 / 46/80                                                                                                                              TREATMENT:  6/10 Upright bike L5 x23min STM/IASTM using hawk grips to calf and achilles Ankle pumps Supine SLR 2x10 S/l abduction 2x10 Bridge with Les on physioball  3x10 Walking in hall x 44ft   6/2 Elliptical L3 x25min Prone DF stretching with STM to achilles/distal triceps surae SLS on floor (shoe doffed) 2x30sec SL hinge x6 Reverse lunges at rail x10L Marching on airex (slow) 2x10 Runner step ups 8" 2x10L Eccentric step down fwd and lateral 4" 2x10ea Sit to stands 2x10  5/30 Prone DF stretching with STM to achilles/distal triceps surae SLS on floor (shoe doffed) 2x30sec SLS on airex 2x30sec (shoe doffed) Marching on airex (slow) x10 Runner step ups 8" 2x10L Eccentric step down fwd and lateral 4" 2x10ea    5/27  Reviewed current function, pain levels, pt presentation, and HEP compliance.  Assessed gait, heel raise, and ROM.  See above Pt completed LEFS.  See above Pt received L ankle DF PROM per pt and tissue tolerance   5/8 Scifit recumbent bike x 5 mins  Assessed ankle DF AROM Pt received L ankle DF PROM per tissue and pt tolerance Ankle DF, Inv, and Eve with RTB 3x10 each except 1 set with YTB for DF  Standing heel raises x 10, x 6 reps seated calf raise 3# ankle weight on thigh x 10, 5# ankle weight on thigh 2 x 10 Steps ups on 6 inch step 2x10   4/25  There-ex: Supine glute stretch 2x30sec each side LTR 3x10 Seated glute stretch 3x30sec each side Standing hip  abd red RTB 3x10 each side Standing hip ext red RTB 3x10 Standing marches red RTB 3x10 Neuro-Re-ed  Marching on airex 3x10  4 way cone taps 3x     4/23 Manual: All PROM performed with distraction to reduce pain and improve  movement Prone IASTM gastroc- focus on medial belly PROM DF  There-ex: Exercise bike warm up PF, DF, EV with yellow RTB 2x10 There-Act Sit to stand 2x8   4/19 Prone IASTM gastroc- focus on lateral belly Sidelying supination  Lunge a/p weight shift for gastroc eccentrics Small heel raise with ball bw ankles Seated hamstrings stretch Sit<>stand LSVT BIG- reviewed sequence  4/17  Reviewed pt presentation, pain level, and response to prior Rx.   Scifit recumbent bike x 5 mins Ankle DF/PF AROM w/n protocol range x10 reps.  PT educated pt in appropriate range including not going past or into tension.  Assessed ankle DF AROM.  Pt received L ankle DF/PF PROM per pt and tissue tolerance w/n protocol range.  Ankle PF, Eve, and DF with YTB x 10, RTB x 10;   Inv YTB 2x10 seated calf raise x10, 2# ankle weight on thigh x 10, 3# ankle weight on thigh x 10  Updated HEP.  Pt received a HEP handout and was educated in correct form and appropriate frequency.  Pt instructed he should not have pain with HEP.  PT instructed pt to have a bend in knee and not have his knee straight when performing theraband ankle exercises.      PATIENT EDUCATION:  Education details:  PT answered pt's questions.  Relevant anatomy, objective findings, goal progress, protocol limits and restrictions, diagnosis, HEP,  and POC.     Person educated: Patient Education method: Medical illustrator, verbal and tactile cues Education comprehension: verbalized understanding and returned demonstration, verbal and tactile cues required  HOME EXERCISE PROGRAM: Access Code: [email or web address] URL: [email or web address] Date: 02/29/2024 Prepared by: Fredia Janus  Exercises - Sidelying Hip Abduction  - 1-2 x daily - 7 x weekly - 3 sets - 10 reps - Active Straight Leg Raise with Quad Set  - 1-2 x daily - 7 x weekly - 3 sets - 10 reps - Seated Long Arc Quad  - 1-2 x daily - 7 x weekly - 3 sets - 10  reps - Supine Ankle Inversion Eversion AROM  - 1-2 x daily - 7 x weekly - 3 sets - 10 reps - Supine Ankle Dorsiflexion and Plantarflexion AROM  - 1-2 x daily - 7 x weekly - 3 sets - 10 reps  Updated HEP: - Long Sitting Ankle Plantar Flexion with Resistance  - 1 x daily - 3-4 x weekly - 2 sets - 10 reps - Long Sitting Ankle Inversion with Anchored Resistance  - 1 x daily - 3-4 x weekly - 2 sets - 10 reps - Long Sitting Ankle Eversion with Resistance  - 1 x daily - 3-4 x weekly - 2 sets - 10 reps  LSVT BIG program provided  ASSESSMENT:  CLINICAL IMPRESSION:  Pt with increased discomfort today even with walking, so held elliptical and performed upright bike instead. Held CKC activities and focused on open chain strengthening and manual therapy. Performed STM/IASTM to calf and achilles region. Notable swelling over incision area. Pt reports this fluctuates depending on activity. Advised pt in ice use and activity modification/restriction. He is currently wearing heel lift again in his sneaker. Will reassess pain level at next visit and progress  if able.    OBJECTIVE IMPAIRMENTS: decreased activity tolerance, difficulty walking, decreased balance, decreased endurance, decreased mobility, decreased ROM, decreased strength, impaired flexibility, impaired UE/LE use, postural dysfunction, and pain.  ACTIVITY LIMITATIONS: bending, lifting, carry, locomotion, cleaning, community activity, driving, and or occupation  PERSONAL FACTORS: Anxiety, HTN, PVC's, parkinson's  are also affecting patient's functional outcome.  REHAB POTENTIAL: Good  CLINICAL DECISION MAKING: Stable/uncomplicated  EVALUATION COMPLEXITY: Low    GOALS: Short term PT Goals Target date: 03/21/2024 Pt will be I and compliant with HEP. Baseline:  Goal status: PARTIALLY MET   Pt will be able to walk without boot for 50% of the day without pain.        Goal status:  GOAL MET  Long term PT goals Target date: 06/09/2024 Pt  will improve ROM to [facility] to improve functional mobility Baseline: Goal status: GOAL MET  5/27 Pt will improve hip/knee strength to at least 5-/5 MMT to improve functional strength Baseline: Goal status: ONGOING Pt will improve LEFS to at least 9 points to to show improved function per MCID Baseline: Goal status: NOT MET Pt will reduce pain by overall 50% overall with usual activity Baseline: Goal status: GOAL MET  5/27 Pt will return to golf and participate in other recreational activities without complaints of pain or limitations.  Baseline: Goal status: ONGOING Pt will be able to ambulate community distances at least 1000 ft WNL gait pattern without complaints Baseline: Goal status: ONGOING         7.  Pt will ascend and descend stairs with a reciprocal gait with the rail.   Goal status:  INITIAL  PLAN: PT FREQUENCY:  2 times per week   PT DURATION:  6 weeks  PLANNED INTERVENTIONS (unless contraindicated): aquatic PT, Canalith repositioning, cryotherapy, Electrical stimulation, Iontophoresis with 4 mg/ml dexamethasome, Moist heat, traction, Ultrasound, gait training, Therapeutic exercise, balance training, neuromuscular re-education, patient/family education, prosthetic training, manual techniques, passive ROM, dry needling, taping, vasopnuematic device, vestibular, spinal manipulations, joint manipulations  PLAN FOR NEXT SESSION: Progress per achilles tendon repair protocol.     Herb Loges, PTA  05/12/24 3:07 PM

## 2024-05-19 ENCOUNTER — Encounter (HOSPITAL_BASED_OUTPATIENT_CLINIC_OR_DEPARTMENT_OTHER): Payer: Self-pay

## 2024-05-19 ENCOUNTER — Ambulatory Visit (HOSPITAL_BASED_OUTPATIENT_CLINIC_OR_DEPARTMENT_OTHER): Payer: Self-pay

## 2024-05-19 DIAGNOSIS — M25572 Pain in left ankle and joints of left foot: Secondary | ICD-10-CM

## 2024-05-19 DIAGNOSIS — R2689 Other abnormalities of gait and mobility: Secondary | ICD-10-CM | POA: Diagnosis not present

## 2024-05-19 DIAGNOSIS — R2681 Unsteadiness on feet: Secondary | ICD-10-CM | POA: Diagnosis not present

## 2024-05-19 DIAGNOSIS — M6281 Muscle weakness (generalized): Secondary | ICD-10-CM | POA: Diagnosis not present

## 2024-05-19 DIAGNOSIS — R600 Localized edema: Secondary | ICD-10-CM

## 2024-05-19 NOTE — Therapy (Signed)
 OUTPATIENT PHYSICAL THERAPY LOWER EXTREMITY TREATMENT       Patient Name: Sean Mendoza MRN: [id number] DOB:[birth year], 78 y.o., male Today's Date: 05/19/2024  END OF SESSION:  PT End of Session - 05/19/24 1642     Visit Number 14    Number of Visits 22    Date for PT Re-Evaluation 06/09/24    Authorization Type [employer]    PT Start Time 1606    PT Stop Time 1645    PT Time Calculation (min) 39 min    Activity Tolerance Patient tolerated treatment well    Behavior During Therapy WFL for tasks assessed/performed                     Past Medical History:  Diagnosis Date   Amaurosis fugax    Anxiety    Cough    wert on set 09/2010   HTN (hypertension)    Migraine    PVC's (premature ventricular contractions)    Past Surgical History:  Procedure Laterality Date   ACHILLES TENDON SURGERY Left 01/15/2024   Procedure: LEFT ACHILLES TENDON REPAIR WITH DEBRIDEMENT;  Surgeon: Ali Ink, MD;  Location: [facility];  Service: Orthopedics;  Laterality: Left;   CERVICAL DISCECTOMY     x 2   ELBOW SURGERY Left    x 3   HERNIA REPAIR     inguinal x 2   Patient Active Problem List   Diagnosis Date Noted   PVC's (premature ventricular contractions) 03/15/2023   Orthostatic lightheadedness 12/31/2020   Sleep apnea 03/19/2011   Hypertension 03/19/2011   Right bundle branch block 03/19/2011   COUGH 11/02/2010   DYSLIPIDEMIA 06/15/2010   Palpitations 06/15/2010    PCP: Ronna Coho, MD  REFERRING PROVIDER: Ali Ink, MD  REFERRING DIAG: Strain of left Achilles tendon, initial encounter [W09.811B]   THERAPY DIAG:  Muscle weakness (generalized)  Localized edema  Pain in left ankle and joints of left foot  Other abnormalities of gait and mobility  Unsteadiness on feet  Rationale for Evaluation and Treatment: Rehabilitation  ONSET DATE: 01/15/2024  SUBJECTIVE:   SUBJECTIVE STATEMENT: Pt  reports ongoing achilles tightness/discomfort since cleaning out his daughter's garage. He took it easy al last week and has been wearing heel wedge.         PERTINENT HISTORY: Left Achilles tendon open debridement with secondary repair on 01/15/2024 Anxiety, HTN, PVC's, parkinson's  Orthostatic hypotension  PAIN:  Are you having pain? Yes: NPRS scale: 0/10 current, 6/10 worst  Pain location: L achilles tendon Pain description: None at this time.  Aggravating factors: None  Relieving factors: Aleve   PRECAUTIONS: Other: WBAT without boot.   RED FLAGS: None   WEIGHT BEARING RESTRICTIONS: Yes WBAT  FALLS:  Has patient fallen in last 6 months? No  LIVING ENVIRONMENT: Lives with: lives with their spouse Lives in: House/apartment Stairs: Yes: Internal: 14 steps; on right going up and External: 5 steps; bilateral but cannot reach both Has following equipment at home: [employer] Bl[employer] - 4 wheeled, shower chair, and Scooter  OCCUPATION: Retired   PLOF: Independent  PATIENT GOALS: Pt would like to get back to golfing.   NEXT MD VISIT: 2 months.   OBJECTIVE:  Note: Objective measures were completed at Evaluation unless otherwise noted.  DIAGNOSTIC FINDINGS: None recent in Chart.    LOWER EXTREMITY ROM:  Active ROM Right eval Left eval Left 4/1 Right 4/10 Left 4/10 Left 4/17 Left 5/8 Left 5/27  Hip flexion          Hip extension          Hip abduction          Hip adduction          Hip internal rotation          Hip external rotation          Knee flexion          Knee extension          Ankle dorsiflexion 5 Lacking 4 degrees Lacking 2 deg from neutral 17 2 deg 7 deg 10 deg 10  Ankle plantarflexion 40 42      58  Ankle inversion 20 22      33  Ankle eversion 25 25      16    (Blank rows = not tested)   Gait:  Pt ambulating with a good heel to toe gait pattern.  Pt has no limp.  Pt performed symmetrical bilat heel raises without pain.    PATIENT SURVEYS:  LEFS   Initial/Current:  51/80 / 46/80                                                                                                                              TREATMENT:  6/17 STM/roller to calf and achilles Prone hip extension 2x10 Supine SLR 2x10 S/l abduction 2x10 Bridge with Les on physioball  3x10 LAQ 3# 5 2x10  6/10 Upright bike L5 x84min STM/IASTM using hawk grips to calf and achilles Ankle pumps Supine SLR 2x10 S/l abduction 2x10 Bridge with Les on physioball  3x10 Walking in hall x 72ft   6/2 Elliptical L3 x56min Prone DF stretching with STM to achilles/distal triceps surae SLS on floor (shoe doffed) 2x30sec SL hinge x6 Reverse lunges at rail x10L Marching on airex (slow) 2x10 Runner step ups 8 2x10L Eccentric step down fwd and lateral 4 2x10ea Sit to stands 2x10  5/30 Prone DF stretching with STM to achilles/distal triceps surae SLS on floor (shoe doffed) 2x30sec SLS on airex 2x30sec (shoe doffed) Marching on airex (slow) x10 Runner step ups 8 2x10L Eccentric step down fwd and lateral 4 2x10ea    5/27  Reviewed current function, pain levels, pt presentation, and HEP compliance.  Assessed gait, heel raise, and ROM.  See above Pt completed LEFS.  See above Pt received L ankle DF PROM per pt and tissue tolerance      PATIENT EDUCATION:  Education details:  PT answered pt's questions.  Relevant anatomy, objective findings, goal progress, protocol limits and restrictions, diagnosis, HEP,  and POC.     Person educated: Patient Education method: Medical illustrator, verbal and tactile cues Education comprehension: verbalized understanding and returned demonstration, verbal and tactile cues required  HOME EXERCISE PROGRAM: Access Code: [email or web address] URL: [email or web address] Date: 02/29/2024 Prepared by: Fredia Janus  Exercises - Sidelying Hip Abduction  - 1-2 x daily - 7  x weekly - 3 sets - 10 reps - Active Straight Leg Raise  with Quad Set  - 1-2 x daily - 7 x weekly - 3 sets - 10 reps - Seated Long Arc Quad  - 1-2 x daily - 7 x weekly - 3 sets - 10 reps - Supine Ankle Inversion Eversion AROM  - 1-2 x daily - 7 x weekly - 3 sets - 10 reps - Supine Ankle Dorsiflexion and Plantarflexion AROM  - 1-2 x daily - 7 x weekly - 3 sets - 10 reps  Updated HEP: - Long Sitting Ankle Plantar Flexion with Resistance  - 1 x daily - 3-4 x weekly - 2 sets - 10 reps - Long Sitting Ankle Inversion with Anchored Resistance  - 1 x daily - 3-4 x weekly - 2 sets - 10 reps - Long Sitting Ankle Eversion with Resistance  - 1 x daily - 3-4 x weekly - 2 sets - 10 reps  LSVT BIG program provided  ASSESSMENT:  CLINICAL IMPRESSION:  Continued with regressed program due to ongoing increased pain. Good tolerance for proximal joint segment strengthening. Discussed activity modification and to continue with ice at home. Instructed pt to alert MD if no improvement or worsening of symptoms. Will monitor pain level and progress lightly if tolerated next visit.    OBJECTIVE IMPAIRMENTS: decreased activity tolerance, difficulty walking, decreased balance, decreased endurance, decreased mobility, decreased ROM, decreased strength, impaired flexibility, impaired UE/LE use, postural dysfunction, and pain.  ACTIVITY LIMITATIONS: bending, lifting, carry, locomotion, cleaning, community activity, driving, and or occupation  PERSONAL FACTORS: Anxiety, HTN, PVC's, parkinson's  are also affecting patient's functional outcome.  REHAB POTENTIAL: Good  CLINICAL DECISION MAKING: Stable/uncomplicated  EVALUATION COMPLEXITY: Low    GOALS: Short term PT Goals Target date: 03/21/2024 Pt will be I and compliant with HEP. Baseline:  Goal status: PARTIALLY MET   Pt will be able to walk without boot for 50% of the day without pain.        Goal status:  GOAL MET  Long term PT goals Target date: 06/09/2024 Pt will improve ROM to [facility] to improve functional  mobility Baseline: Goal status: GOAL MET  5/27 Pt will improve hip/knee strength to at least 5-/5 MMT to improve functional strength Baseline: Goal status: ONGOING Pt will improve LEFS to at least 9 points to to show improved function per MCID Baseline: Goal status: NOT MET Pt will reduce pain by overall 50% overall with usual activity Baseline: Goal status: GOAL MET  5/27 Pt will return to golf and participate in other recreational activities without complaints of pain or limitations.  Baseline: Goal status: ONGOING Pt will be able to ambulate community distances at least 1000 ft WNL gait pattern without complaints Baseline: Goal status: ONGOING         7.  Pt will ascend and descend stairs with a reciprocal gait with the rail.   Goal status:  INITIAL  PLAN: PT FREQUENCY:  2 times per week   PT DURATION:  6 weeks  PLANNED INTERVENTIONS (unless contraindicated): aquatic PT, Canalith repositioning, cryotherapy, Electrical stimulation, Iontophoresis with 4 mg/ml dexamethasome, Moist heat, traction, Ultrasound, gait training, Therapeutic exercise, balance training, neuromuscular re-education, patient/family education, prosthetic training, manual techniques, passive ROM, dry needling, taping, vasopnuematic device, vestibular, spinal manipulations, joint manipulations  PLAN FOR NEXT SESSION: Progress per achilles tendon repair protocol.     Herb Loges, PTA  05/19/24 5:11 PM

## 2024-05-21 DIAGNOSIS — F3342 Major depressive disorder, recurrent, in full remission: Secondary | ICD-10-CM | POA: Diagnosis not present

## 2024-05-23 ENCOUNTER — Ambulatory Visit (HOSPITAL_BASED_OUTPATIENT_CLINIC_OR_DEPARTMENT_OTHER): Admitting: Physical Therapy

## 2024-05-23 DIAGNOSIS — M6281 Muscle weakness (generalized): Secondary | ICD-10-CM

## 2024-05-23 DIAGNOSIS — M25572 Pain in left ankle and joints of left foot: Secondary | ICD-10-CM

## 2024-05-23 DIAGNOSIS — R600 Localized edema: Secondary | ICD-10-CM

## 2024-05-23 DIAGNOSIS — R2689 Other abnormalities of gait and mobility: Secondary | ICD-10-CM

## 2024-05-23 DIAGNOSIS — R2681 Unsteadiness on feet: Secondary | ICD-10-CM | POA: Diagnosis not present

## 2024-05-23 NOTE — Therapy (Signed)
 OUTPATIENT PHYSICAL THERAPY LOWER EXTREMITY TREATMENT       Patient Name: Sean Mendoza MRN: [id number] DOB:Feb 22, 1946, 78 y.o., male Today's Date: 05/23/2024  END OF SESSION:  PT End of Session - 05/23/24 0941     Visit Number 15    Number of Visits 22    Date for PT Re-Evaluation 06/09/24    Authorization Type [employer]    PT Start Time [phone]    PT Stop Time 1000    PT Time Calculation (min) 34 min    Equipment Utilized During Treatment Other (comment)    Activity Tolerance Patient tolerated treatment well    Behavior During Therapy WFL for tasks assessed/performed                      Past Medical History:  Diagnosis Date   Amaurosis fugax    Anxiety    Cough    wert on set 09/2010   HTN (hypertension)    Migraine    PVC's (premature ventricular contractions)    Past Surgical History:  Procedure Laterality Date   ACHILLES TENDON SURGERY Left 01/15/2024   Procedure: LEFT ACHILLES TENDON REPAIR WITH DEBRIDEMENT;  Surgeon: Barton Drape, MD;  Location: [facility];  Service: Orthopedics;  Laterality: Left;   CERVICAL DISCECTOMY     x 2   ELBOW SURGERY Left    x 3   HERNIA REPAIR     inguinal x 2   Patient Active Problem List   Diagnosis Date Noted   PVC's (premature ventricular contractions) 03/15/2023   Orthostatic lightheadedness 12/31/2020   Sleep apnea 03/19/2011   Hypertension 03/19/2011   Right bundle branch block 03/19/2011   COUGH 11/02/2010   DYSLIPIDEMIA 06/15/2010   Palpitations 06/15/2010    PCP: Kip Righter, MD  REFERRING PROVIDER: Barton Drape, MD  REFERRING DIAG: Strain of left Achilles tendon, initial encounter [D13.987J]   THERAPY DIAG:  Muscle weakness (generalized)  Localized edema  Pain in left ankle and joints of left foot  Other abnormalities of gait and mobility  Unsteadiness on feet  Rationale for Evaluation and Treatment: Rehabilitation  ONSET DATE:  01/15/2024  SUBJECTIVE:   SUBJECTIVE STATEMENT: Pt reports continued pain with stairs. He states that he has been sore the last few weeks. He is trying his best to stay active despite everything going on.         PERTINENT HISTORY: Left Achilles tendon open debridement with secondary repair on 01/15/2024 Anxiety, HTN, PVC's, parkinson's  Orthostatic hypotension  PAIN:  Are you having pain? Yes: NPRS scale: 0/10 current, 6/10 worst  Pain location: L achilles tendon Pain description: None at this time.  Aggravating factors: None  Relieving factors: Aleve   PRECAUTIONS: Other: WBAT without boot.   RED FLAGS: None   WEIGHT BEARING RESTRICTIONS: Yes WBAT  FALLS:  Has patient fallen in last 6 months? No  LIVING ENVIRONMENT: Lives with: lives with their spouse Lives in: House/apartment Stairs: Yes: Internal: 14 steps; on right going up and External: 5 steps; bilateral but cannot reach both Has following equipment at home: Vannie - 4 wheeled, shower chair, and Scooter  OCCUPATION: Retired   PLOF: Independent  PATIENT GOALS: Pt would like to get back to golfing.   NEXT MD VISIT: 2 months.   OBJECTIVE:  Note: Objective measures were completed at Evaluation unless otherwise noted.  DIAGNOSTIC FINDINGS: None recent in Chart.    LOWER EXTREMITY ROM:  Active ROM Right  eval Left eval Left 4/1 Right 4/10 Left 4/10 Left 4/17 Left 5/8 Left 5/27  Hip flexion          Hip extension          Hip abduction          Hip adduction          Hip internal rotation          Hip external rotation          Knee flexion          Knee extension          Ankle dorsiflexion 5 Lacking 4 degrees Lacking 2 deg from neutral 17 2 deg 7 deg 10 deg 10  Ankle plantarflexion 40 42      58  Ankle inversion 20 22      33  Ankle eversion 25 25      16    (Blank rows = not tested)   Gait:  Pt ambulating with a good heel to toe gait pattern.  Pt has no limp.  Pt performed symmetrical  bilat heel raises without pain.    PATIENT SURVEYS:  LEFS  Initial/Current:  51/80 / 46/80                                                                                                                              TREATMENT: 6/21 - STM to calf and scar tissue mobs - Passive stretching and PA grade 4 mobs  - Cat/ Cow - LTR with alt arms into flexion - standing heel raises 2x10  - Leg press 150lbs with focus on slow controlled movements.   6/17 STM/roller to calf and achilles Prone hip extension 2x10 Supine SLR 2x10 S/l abduction 2x10 Bridge with Les on physioball  3x10 LAQ 3# 5 2x10  6/10 Upright bike L5 x43min STM/IASTM using hawk grips to calf and achilles Ankle pumps Supine SLR 2x10 S/l abduction 2x10 Bridge with Les on physioball  3x10 Walking in hall x 82ft   6/2 Elliptical L3 x58min Prone DF stretching with STM to achilles/distal triceps surae SLS on floor (shoe doffed) 2x30sec SL hinge x6 Reverse lunges at rail x10L Marching on airex (slow) 2x10 Runner step ups 8 2x10L Eccentric step down fwd and lateral 4 2x10ea Sit to stands 2x10  5/30 Prone DF stretching with STM to achilles/distal triceps surae SLS on floor (shoe doffed) 2x30sec SLS on airex 2x30sec (shoe doffed) Marching on airex (slow) x10 Runner step ups 8 2x10L Eccentric step down fwd and lateral 4 2x10ea    5/27  Reviewed current function, pain levels, pt presentation, and HEP compliance.  Assessed gait, heel raise, and ROM.  See above Pt completed LEFS.  See above Pt received L ankle DF PROM per pt and tissue tolerance      PATIENT EDUCATION:  Education details:  PT answered pt's questions.  Relevant anatomy, objective findings, goal progress, protocol limits and restrictions,  diagnosis, HEP,  and POC.     Person educated: Patient Education method: Medical illustrator, verbal and tactile cues Education comprehension: verbalized understanding and returned demonstration,  verbal and tactile cues required  HOME EXERCISE PROGRAM: Access Code: [email or web address] URL: [email or web address] Date: 02/29/2024 Prepared by: Rojean Batten  Exercises - Sidelying Hip Abduction  - 1-2 x daily - 7 x weekly - 3 sets - 10 reps - Active Straight Leg Raise with Quad Set  - 1-2 x daily - 7 x weekly - 3 sets - 10 reps - Seated Long Arc Quad  - 1-2 x daily - 7 x weekly - 3 sets - 10 reps - Supine Ankle Inversion Eversion AROM  - 1-2 x daily - 7 x weekly - 3 sets - 10 reps - Supine Ankle Dorsiflexion and Plantarflexion AROM  - 1-2 x daily - 7 x weekly - 3 sets - 10 reps  Updated HEP: - Long Sitting Ankle Plantar Flexion with Resistance  - 1 x daily - 3-4 x weekly - 2 sets - 10 reps - Long Sitting Ankle Inversion with Anchored Resistance  - 1 x daily - 3-4 x weekly - 2 sets - 10 reps - Long Sitting Ankle Eversion with Resistance  - 1 x daily - 3-4 x weekly - 2 sets - 10 reps  LSVT BIG program provided  ASSESSMENT:  CLINICAL IMPRESSION:  Pt continues to report ongoing pain, but has reduced overall. Today's session with focus on LE strengthening within limits. Pt did very well with prescribed exercises with no increase in symptoms, he feels as though the challenge was appropriate .STM to scar tissue and PA mobs followed by stretching to help with ROM. Pt encouraged to continue with strengthening to help with ongoing ailments. Also addressed LB stiffness reported upon arrival with some stretches. Will monitor pain level and progress lightly if tolerated next visit.    OBJECTIVE IMPAIRMENTS: decreased activity tolerance, difficulty walking, decreased balance, decreased endurance, decreased mobility, decreased ROM, decreased strength, impaired flexibility, impaired UE/LE use, postural dysfunction, and pain.  ACTIVITY LIMITATIONS: bending, lifting, carry, locomotion, cleaning, community activity, driving, and or occupation  PERSONAL FACTORS: Anxiety, HTN, PVC's, parkinson's  are  also affecting patient's functional outcome.  REHAB POTENTIAL: Good  CLINICAL DECISION MAKING: Stable/uncomplicated  EVALUATION COMPLEXITY: Low    GOALS: Short term PT Goals Target date: 03/21/2024 Pt will be I and compliant with HEP. Baseline:  Goal status: PARTIALLY MET   Pt will be able to walk without boot for 50% of the day without pain.        Goal status:  GOAL MET  Long term PT goals Target date: 06/09/2024 Pt will improve ROM to [facility] to improve functional mobility Baseline: Goal status: GOAL MET  5/27 Pt will improve hip/knee strength to at least 5-/5 MMT to improve functional strength Baseline: Goal status: ONGOING Pt will improve LEFS to at least 9 points to to show improved function per MCID Baseline: Goal status: NOT MET Pt will reduce pain by overall 50% overall with usual activity Baseline: Goal status: GOAL MET  5/27 Pt will return to golf and participate in other recreational activities without complaints of pain or limitations.  Baseline: Goal status: ONGOING Pt will be able to ambulate community distances at least 1000 ft WNL gait pattern without complaints Baseline: Goal status: ONGOING         7.  Pt will ascend and descend stairs with a reciprocal gait with the rail.   Goal status:  INITIAL  PLAN: PT FREQUENCY:  2 times per week   PT DURATION:  6 weeks  PLANNED INTERVENTIONS (unless contraindicated): aquatic PT, Canalith repositioning, cryotherapy, Electrical stimulation, Iontophoresis with 4 mg/ml dexamethasome, Moist heat, traction, Ultrasound, gait training, Therapeutic exercise, balance training, neuromuscular re-education, patient/family education, prosthetic training, manual techniques, passive ROM, dry needling, taping, vasopnuematic device, vestibular, spinal manipulations, joint manipulations  PLAN FOR NEXT SESSION: Progress per achilles tendon repair protocol.     Rojean Batten PT, DPT 05/23/24  11:29 AM

## 2024-05-24 ENCOUNTER — Other Ambulatory Visit: Payer: Self-pay | Admitting: Internal Medicine

## 2024-05-26 ENCOUNTER — Telehealth: Payer: Self-pay | Admitting: Internal Medicine

## 2024-05-26 ENCOUNTER — Ambulatory Visit (HOSPITAL_BASED_OUTPATIENT_CLINIC_OR_DEPARTMENT_OTHER)

## 2024-05-26 ENCOUNTER — Encounter (HOSPITAL_BASED_OUTPATIENT_CLINIC_OR_DEPARTMENT_OTHER): Payer: Self-pay

## 2024-05-26 DIAGNOSIS — M6281 Muscle weakness (generalized): Secondary | ICD-10-CM

## 2024-05-26 DIAGNOSIS — R2689 Other abnormalities of gait and mobility: Secondary | ICD-10-CM

## 2024-05-26 DIAGNOSIS — G250 Essential tremor: Secondary | ICD-10-CM

## 2024-05-26 DIAGNOSIS — R600 Localized edema: Secondary | ICD-10-CM

## 2024-05-26 DIAGNOSIS — R2681 Unsteadiness on feet: Secondary | ICD-10-CM

## 2024-05-26 DIAGNOSIS — F320 Major depressive disorder, single episode, mild: Secondary | ICD-10-CM | POA: Diagnosis not present

## 2024-05-26 DIAGNOSIS — M25572 Pain in left ankle and joints of left foot: Secondary | ICD-10-CM

## 2024-05-26 NOTE — Therapy (Signed)
 OUTPATIENT PHYSICAL THERAPY LOWER EXTREMITY TREATMENT       Patient Name: Sean Mendoza MRN: [id number] DOB:January 24, 1946, 78 y.o., male Today's Date: 05/26/2024  END OF SESSION:  PT End of Session - 05/26/24 1150     Visit Number 16    Number of Visits 22    Date for PT Re-Evaluation 06/09/24    Authorization Type [employer]    PT Start Time 1105    PT Stop Time 1144    PT Time Calculation (min) 39 min    Activity Tolerance Patient tolerated treatment well    Behavior During Therapy WFL for tasks assessed/performed                       Past Medical History:  Diagnosis Date   Amaurosis fugax    Anxiety    Cough    wert on set 09/2010   HTN (hypertension)    Migraine    PVC's (premature ventricular contractions)    Past Surgical History:  Procedure Laterality Date   ACHILLES TENDON SURGERY Left 01/15/2024   Procedure: LEFT ACHILLES TENDON REPAIR WITH DEBRIDEMENT;  Surgeon: Barton Drape, MD;  Location: [facility];  Service: Orthopedics;  Laterality: Left;   CERVICAL DISCECTOMY     x 2   ELBOW SURGERY Left    x 3   HERNIA REPAIR     inguinal x 2   Patient Active Problem List   Diagnosis Date Noted   PVC's (premature ventricular contractions) 03/15/2023   Orthostatic lightheadedness 12/31/2020   Sleep apnea 03/19/2011   Hypertension 03/19/2011   Right bundle branch block 03/19/2011   COUGH 11/02/2010   DYSLIPIDEMIA 06/15/2010   Palpitations 06/15/2010    PCP: Kip Righter, MD  REFERRING PROVIDER: Barton Drape, MD  REFERRING DIAG: Strain of left Achilles tendon, initial encounter [D13.987J]   THERAPY DIAG:  Muscle weakness (generalized)  Localized edema  Pain in left ankle and joints of left foot  Unsteadiness on feet  Other abnormalities of gait and mobility  Rationale for Evaluation and Treatment: Rehabilitation  ONSET DATE: 01/15/2024  SUBJECTIVE:   SUBJECTIVE STATEMENT: Pt  reports continued pain with stairs. He states that he has been sore the last few weeks. He is trying his best to stay active despite everything going on.         PERTINENT HISTORY: Left Achilles tendon open debridement with secondary repair on 01/15/2024 Anxiety, HTN, PVC's, parkinson's  Orthostatic hypotension  PAIN:  Are you having pain? Yes: NPRS scale: 0/10 current, 6/10 worst  Pain location: L achilles tendon Pain description: None at this time.  Aggravating factors: None  Relieving factors: Aleve   PRECAUTIONS: Other: WBAT without boot.   RED FLAGS: None   WEIGHT BEARING RESTRICTIONS: Yes WBAT  FALLS:  Has patient fallen in last 6 months? No  LIVING ENVIRONMENT: Lives with: lives with their spouse Lives in: House/apartment Stairs: Yes: Internal: 14 steps; on right going up and External: 5 steps; bilateral but cannot reach both Has following equipment at home: Vannie - 4 wheeled, shower chair, and Scooter  OCCUPATION: Retired   PLOF: Independent  PATIENT GOALS: Pt would like to get back to golfing.   NEXT MD VISIT: 2 months.   OBJECTIVE:  Note: Objective measures were completed at Evaluation unless otherwise noted.  DIAGNOSTIC FINDINGS: None recent in Chart.    LOWER EXTREMITY ROM:  Active ROM Right eval Left eval Left 4/1 Right 4/10 Left  4/10 Left 4/17 Left 5/8 Left 5/27  Hip flexion          Hip extension          Hip abduction          Hip adduction          Hip internal rotation          Hip external rotation          Knee flexion          Knee extension          Ankle dorsiflexion 5 Lacking 4 degrees Lacking 2 deg from neutral 17 2 deg 7 deg 10 deg 10  Ankle plantarflexion 40 42      58  Ankle inversion 20 22      33  Ankle eversion 25 25      16    (Blank rows = not tested)   Gait:  Pt ambulating with a good heel to toe gait pattern.  Pt has no limp.  Pt performed symmetrical bilat heel raises without pain.    PATIENT SURVEYS:   LEFS  Initial/Current:  51/80 / 46/80                                                                                                                              TREATMENT:  6/24 - STM to calf and scar tissue mobs - Passive stretching DF in prone -prone hip extension 3x10ea -bridge 2x10 -Sit to stands 2x10 -tandem balance on airex 3x20seconds -SLS on floor and on airex 3x20sec ea  6/21 - STM to calf and scar tissue mobs - Passive stretching and PA grade 4 mobs  - Cat/ Cow - LTR with alt arms into flexion - standing heel raises 2x10  - Leg press 150lbs with focus on slow controlled movements.   6/17 STM/roller to calf and achilles Prone hip extension 2x10 Supine SLR 2x10 S/l abduction 2x10 Bridge with Les on physioball  3x10 LAQ 3# 5 2x10  6/10 Upright bike L5 x11min STM/IASTM using hawk grips to calf and achilles Ankle pumps Supine SLR 2x10 S/l abduction 2x10 Bridge with Les on physioball  3x10 Walking in hall x 70ft   6/2 Elliptical L3 x57min Prone DF stretching with STM to achilles/distal triceps surae SLS on floor (shoe doffed) 2x30sec SL hinge x6 Reverse lunges at rail x10L Marching on airex (slow) 2x10 Runner step ups 8 2x10L Eccentric step down fwd and lateral 4 2x10ea Sit to stands 2x10  5/30 Prone DF stretching with STM to achilles/distal triceps surae SLS on floor (shoe doffed) 2x30sec SLS on airex 2x30sec (shoe doffed) Marching on airex (slow) x10 Runner step ups 8 2x10L Eccentric step down fwd and lateral 4 2x10ea    5/27  Reviewed current function, pain levels, pt presentation, and HEP compliance.  Assessed gait, heel raise, and ROM.  See above Pt completed LEFS.  See above Pt received L ankle DF  PROM per pt and tissue tolerance      PATIENT EDUCATION:  Education details:  PT answered pt's questions.  Relevant anatomy, objective findings, goal progress, protocol limits and restrictions, diagnosis, HEP,  and POC.     Person  educated: Patient Education method: Medical illustrator, verbal and tactile cues Education comprehension: verbalized understanding and returned demonstration, verbal and tactile cues required  HOME EXERCISE PROGRAM: Access Code: [email or web address] URL: [email or web address] Date: 02/29/2024 Prepared by: Rojean Batten  Exercises - Sidelying Hip Abduction  - 1-2 x daily - 7 x weekly - 3 sets - 10 reps - Active Straight Leg Raise with Quad Set  - 1-2 x daily - 7 x weekly - 3 sets - 10 reps - Seated Long Arc Quad  - 1-2 x daily - 7 x weekly - 3 sets - 10 reps - Supine Ankle Inversion Eversion AROM  - 1-2 x daily - 7 x weekly - 3 sets - 10 reps - Supine Ankle Dorsiflexion and Plantarflexion AROM  - 1-2 x daily - 7 x weekly - 3 sets - 10 reps  Updated HEP: - Long Sitting Ankle Plantar Flexion with Resistance  - 1 x daily - 3-4 x weekly - 2 sets - 10 reps - Long Sitting Ankle Inversion with Anchored Resistance  - 1 x daily - 3-4 x weekly - 2 sets - 10 reps - Long Sitting Ankle Eversion with Resistance  - 1 x daily - 3-4 x weekly - 2 sets - 10 reps  LSVT BIG program provided  ASSESSMENT:  CLINICAL IMPRESSION:  Continued with STM to scar tissue and incision area. Pt continues to report soreness with weight bearing positions. He tolerated MT well and was able to complete gentle balance tasks without onset of pain. Advised pt to continue being careful with activities at home and to avoid pushing through any pain. Pt has f/u with MD on Friday. Will continue to progress as tolerated.    OBJECTIVE IMPAIRMENTS: decreased activity tolerance, difficulty walking, decreased balance, decreased endurance, decreased mobility, decreased ROM, decreased strength, impaired flexibility, impaired UE/LE use, postural dysfunction, and pain.  ACTIVITY LIMITATIONS: bending, lifting, carry, locomotion, cleaning, community activity, driving, and or occupation  PERSONAL FACTORS: Anxiety, HTN, PVC's,  parkinson's  are also affecting patient's functional outcome.  REHAB POTENTIAL: Good  CLINICAL DECISION MAKING: Stable/uncomplicated  EVALUATION COMPLEXITY: Low    GOALS: Short term PT Goals Target date: 03/21/2024 Pt will be I and compliant with HEP. Baseline:  Goal status: PARTIALLY MET   Pt will be able to walk without boot for 50% of the day without pain.        Goal status:  GOAL MET  Long term PT goals Target date: 06/09/2024 Pt will improve ROM to [facility] to improve functional mobility Baseline: Goal status: GOAL MET  5/27 Pt will improve hip/knee strength to at least 5-/5 MMT to improve functional strength Baseline: Goal status: ONGOING Pt will improve LEFS to at least 9 points to to show improved function per MCID Baseline: Goal status: NOT MET Pt will reduce pain by overall 50% overall with usual activity Baseline: Goal status: GOAL MET  5/27 Pt will return to golf and participate in other recreational activities without complaints of pain or limitations.  Baseline: Goal status: ONGOING Pt will be able to ambulate community distances at least 1000 ft WNL gait pattern without complaints Baseline: Goal status: ONGOING         7.  Pt will ascend and descend stairs  with a reciprocal gait with the rail.   Goal status:  INITIAL  PLAN: PT FREQUENCY:  2 times per week   PT DURATION:  6 weeks  PLANNED INTERVENTIONS (unless contraindicated): aquatic PT, Canalith repositioning, cryotherapy, Electrical stimulation, Iontophoresis with 4 mg/ml dexamethasome, Moist heat, traction, Ultrasound, gait training, Therapeutic exercise, balance training, neuromuscular re-education, patient/family education, prosthetic training, manual techniques, passive ROM, dry needling, taping, vasopnuematic device, vestibular, spinal manipulations, joint manipulations  PLAN FOR NEXT SESSION: Progress per achilles tendon repair protocol.     Asberry Rodes, PTA  05/26/24  1:19 PM

## 2024-05-26 NOTE — Telephone Encounter (Signed)
*  STAT* If patient is at the pharmacy, call can be transferred to refill team.   1. Which medications need to be refilled? (please list name of each medication and dose if known) diltiazem  (CARDIZEM  CD) 120 MG 24 hr capsule  atorvastatin  (LIPITOR) 20 MG tablet  propranolol  (INDERAL ) 10 MG tablet  levETIRAcetam (KEPPRA) 250 MG tablet     2. Would you like to learn more about the convenience, safety, & potential cost savings by using the [facility]?     3. Are you open to using the [facility] (Type [facility].  ).   4. Which pharmacy/location (including street and city if local pharmacy) is medication to be sent to? [id number] - [place], [place] - [address]    5. Do they need a 30 day or 90 day supply? 90 day

## 2024-05-27 MED ORDER — DILTIAZEM HCL ER COATED BEADS 120 MG PO CP24: 120.0000 mg | ORAL_CAPSULE | Freq: Every day | ORAL | 2 refills | Status: DC

## 2024-05-27 MED ORDER — PROPRANOLOL HCL 10 MG PO TABS: ORAL_TABLET | ORAL | 2 refills | Status: AC

## 2024-05-27 MED ORDER — ATORVASTATIN CALCIUM 20 MG PO TABS: 20.0000 mg | ORAL_TABLET | Freq: Every day | ORAL | 2 refills | Status: DC

## 2024-05-27 NOTE — Telephone Encounter (Signed)
 Pt's medications were sent to pt's pharmacy as requested. Confirmation received.

## 2024-05-29 ENCOUNTER — Ambulatory Visit (HOSPITAL_BASED_OUTPATIENT_CLINIC_OR_DEPARTMENT_OTHER)

## 2024-05-29 DIAGNOSIS — S86012A Strain of left Achilles tendon, initial encounter: Secondary | ICD-10-CM | POA: Diagnosis not present

## 2024-05-29 NOTE — Therapy (Incomplete)
 OUTPATIENT PHYSICAL THERAPY LOWER EXTREMITY TREATMENT       Patient Name: Sean Mendoza MRN: [id number] DOB:24-Dec-1945, 78 y.o., male Today's Date: 05/29/2024  END OF SESSION:                 Past Medical History:  Diagnosis Date   Amaurosis fugax    Anxiety    Cough    wert on set 09/2010   HTN (hypertension)    Migraine    PVC's (premature ventricular contractions)    Past Surgical History:  Procedure Laterality Date   ACHILLES TENDON SURGERY Left 01/15/2024   Procedure: LEFT ACHILLES TENDON REPAIR WITH DEBRIDEMENT;  Surgeon: Barton Drape, MD;  Location: [facility];  Service: Orthopedics;  Laterality: Left;   CERVICAL DISCECTOMY     x 2   ELBOW SURGERY Left    x 3   HERNIA REPAIR     inguinal x 2   Patient Active Problem List   Diagnosis Date Noted   PVC's (premature ventricular contractions) 03/15/2023   Orthostatic lightheadedness 12/31/2020   Sleep apnea 03/19/2011   Hypertension 03/19/2011   Right bundle branch block 03/19/2011   COUGH 11/02/2010   DYSLIPIDEMIA 06/15/2010   Palpitations 06/15/2010    PCP: Kip Righter, MD  REFERRING PROVIDER: Barton Drape, MD  REFERRING DIAG: Strain of left Achilles tendon, initial encounter [D13.987J]   THERAPY DIAG:  No diagnosis found.  Rationale for Evaluation and Treatment: Rehabilitation  ONSET DATE: 01/15/2024  SUBJECTIVE:   SUBJECTIVE STATEMENT: Pt reports continued pain with stairs. He states that he has been sore the last few weeks. He is trying his best to stay active despite everything going on.         PERTINENT HISTORY: Left Achilles tendon open debridement with secondary repair on 01/15/2024 Anxiety, HTN, PVC's, parkinson's  Orthostatic hypotension  PAIN:  Are you having pain? Yes: NPRS scale: 0/10 current, 6/10 worst  Pain location: L achilles tendon Pain description: None at this time.  Aggravating factors: None  Relieving factors: Aleve    PRECAUTIONS: Other: WBAT without boot.   RED FLAGS: None   WEIGHT BEARING RESTRICTIONS: Yes WBAT  FALLS:  Has patient fallen in last 6 months? No  LIVING ENVIRONMENT: Lives with: lives with their spouse Lives in: House/apartment Stairs: Yes: Internal: 14 steps; on right going up and External: 5 steps; bilateral but cannot reach both Has following equipment at home: Vannie - 4 wheeled, shower chair, and Scooter  OCCUPATION: Retired   PLOF: Independent  PATIENT GOALS: Pt would like to get back to golfing.   NEXT MD VISIT: 2 months.   OBJECTIVE:  Note: Objective measures were completed at Evaluation unless otherwise noted.  DIAGNOSTIC FINDINGS: None recent in Chart.    LOWER EXTREMITY ROM:  Active ROM Right eval Left eval Left 4/1 Right 4/10 Left 4/10 Left 4/17 Left 5/8 Left 5/27  Hip flexion          Hip extension          Hip abduction          Hip adduction          Hip internal rotation          Hip external rotation          Knee flexion          Knee extension          Ankle dorsiflexion 5 Lacking 4 degrees Lacking 2 deg from neutral 17 2 deg  7 deg 10 deg 10  Ankle plantarflexion 40 42      58  Ankle inversion 20 22      33  Ankle eversion 25 25      16    (Blank rows = not tested)   Gait:  Pt ambulating with a good heel to toe gait pattern.  Pt has no limp.  Pt performed symmetrical bilat heel raises without pain.    PATIENT SURVEYS:  LEFS  Initial/Current:  51/80 / 46/80                                                                                                                              TREATMENT:  6/24 - STM to calf and scar tissue mobs - Passive stretching DF in prone -prone hip extension 3x10ea -bridge 2x10 -Sit to stands 2x10 -tandem balance on airex 3x20seconds -SLS on floor and on airex 3x20sec ea  6/21 - STM to calf and scar tissue mobs - Passive stretching and PA grade 4 mobs  - Cat/ Cow - LTR with alt arms into  flexion - standing heel raises 2x10  - Leg press 150lbs with focus on slow controlled movements.   6/17 STM/roller to calf and achilles Prone hip extension 2x10 Supine SLR 2x10 S/l abduction 2x10 Bridge with Les on physioball  3x10 LAQ 3# 5 2x10  6/10 Upright bike L5 x79min STM/IASTM using hawk grips to calf and achilles Ankle pumps Supine SLR 2x10 S/l abduction 2x10 Bridge with Les on physioball  3x10 Walking in hall x 12ft   6/2 Elliptical L3 x7min Prone DF stretching with STM to achilles/distal triceps surae SLS on floor (shoe doffed) 2x30sec SL hinge x6 Reverse lunges at rail x10L Marching on airex (slow) 2x10 Runner step ups 8 2x10L Eccentric step down fwd and lateral 4 2x10ea Sit to stands 2x10  5/30 Prone DF stretching with STM to achilles/distal triceps surae SLS on floor (shoe doffed) 2x30sec SLS on airex 2x30sec (shoe doffed) Marching on airex (slow) x10 Runner step ups 8 2x10L Eccentric step down fwd and lateral 4 2x10ea    5/27  Reviewed current function, pain levels, pt presentation, and HEP compliance.  Assessed gait, heel raise, and ROM.  See above Pt completed LEFS.  See above Pt received L ankle DF PROM per pt and tissue tolerance      PATIENT EDUCATION:  Education details:  PT answered pt's questions.  Relevant anatomy, objective findings, goal progress, protocol limits and restrictions, diagnosis, HEP,  and POC.     Person educated: Patient Education method: Medical illustrator, verbal and tactile cues Education comprehension: verbalized understanding and returned demonstration, verbal and tactile cues required  HOME EXERCISE PROGRAM: Access Code: [email or web address] URL: [email or web address] Date: 02/29/2024 Prepared by: Rojean Batten  Exercises - Sidelying Hip Abduction  - 1-2 x daily - 7 x weekly - 3 sets - 10 reps - Active Straight Leg Raise with Quad Set  - 1-2  x daily - 7 x weekly - 3 sets - 10 reps - Seated  Long Arc Quad  - 1-2 x daily - 7 x weekly - 3 sets - 10 reps - Supine Ankle Inversion Eversion AROM  - 1-2 x daily - 7 x weekly - 3 sets - 10 reps - Supine Ankle Dorsiflexion and Plantarflexion AROM  - 1-2 x daily - 7 x weekly - 3 sets - 10 reps  Updated HEP: - Long Sitting Ankle Plantar Flexion with Resistance  - 1 x daily - 3-4 x weekly - 2 sets - 10 reps - Long Sitting Ankle Inversion with Anchored Resistance  - 1 x daily - 3-4 x weekly - 2 sets - 10 reps - Long Sitting Ankle Eversion with Resistance  - 1 x daily - 3-4 x weekly - 2 sets - 10 reps  LSVT BIG program provided  ASSESSMENT:  CLINICAL IMPRESSION:  Continued with STM to scar tissue and incision area. Pt continues to report soreness with weight bearing positions. He tolerated MT well and was able to complete gentle balance tasks without onset of pain. Advised pt to continue being careful with activities at home and to avoid pushing through any pain. Pt has f/u with MD on Friday. Will continue to progress as tolerated.    OBJECTIVE IMPAIRMENTS: decreased activity tolerance, difficulty walking, decreased balance, decreased endurance, decreased mobility, decreased ROM, decreased strength, impaired flexibility, impaired UE/LE use, postural dysfunction, and pain.  ACTIVITY LIMITATIONS: bending, lifting, carry, locomotion, cleaning, community activity, driving, and or occupation  PERSONAL FACTORS: Anxiety, HTN, PVC's, parkinson's  are also affecting patient's functional outcome.  REHAB POTENTIAL: Good  CLINICAL DECISION MAKING: Stable/uncomplicated  EVALUATION COMPLEXITY: Low    GOALS: Short term PT Goals Target date: 03/21/2024 Pt will be I and compliant with HEP. Baseline:  Goal status: PARTIALLY MET   Pt will be able to walk without boot for 50% of the day without pain.        Goal status:  GOAL MET  Long term PT goals Target date: 06/09/2024 Pt will improve ROM to [facility] to improve functional mobility Baseline: Goal  status: GOAL MET  5/27 Pt will improve hip/knee strength to at least 5-/5 MMT to improve functional strength Baseline: Goal status: ONGOING Pt will improve LEFS to at least 9 points to to show improved function per MCID Baseline: Goal status: NOT MET Pt will reduce pain by overall 50% overall with usual activity Baseline: Goal status: GOAL MET  5/27 Pt will return to golf and participate in other recreational activities without complaints of pain or limitations.  Baseline: Goal status: ONGOING Pt will be able to ambulate community distances at least 1000 ft WNL gait pattern without complaints Baseline: Goal status: ONGOING         7.  Pt will ascend and descend stairs with a reciprocal gait with the rail.   Goal status:  INITIAL  PLAN: PT FREQUENCY:  2 times per week   PT DURATION:  6 weeks  PLANNED INTERVENTIONS (unless contraindicated): aquatic PT, Canalith repositioning, cryotherapy, Electrical stimulation, Iontophoresis with 4 mg/ml dexamethasome, Moist heat, traction, Ultrasound, gait training, Therapeutic exercise, balance training, neuromuscular re-education, patient/family education, prosthetic training, manual techniques, passive ROM, dry needling, taping, vasopnuematic device, vestibular, spinal manipulations, joint manipulations  PLAN FOR NEXT SESSION: Progress per achilles tendon repair protocol.     Asberry Rodes, PTA  05/29/24  9:26 AM

## 2024-06-02 ENCOUNTER — Ambulatory Visit (HOSPITAL_BASED_OUTPATIENT_CLINIC_OR_DEPARTMENT_OTHER): Attending: Orthopaedic Surgery | Admitting: Physical Therapy

## 2024-06-02 ENCOUNTER — Encounter (HOSPITAL_BASED_OUTPATIENT_CLINIC_OR_DEPARTMENT_OTHER): Payer: Self-pay | Admitting: Physical Therapy

## 2024-06-02 DIAGNOSIS — R2689 Other abnormalities of gait and mobility: Secondary | ICD-10-CM | POA: Insufficient documentation

## 2024-06-02 DIAGNOSIS — R600 Localized edema: Secondary | ICD-10-CM | POA: Diagnosis not present

## 2024-06-02 DIAGNOSIS — M6281 Muscle weakness (generalized): Secondary | ICD-10-CM | POA: Insufficient documentation

## 2024-06-02 DIAGNOSIS — R2681 Unsteadiness on feet: Secondary | ICD-10-CM | POA: Insufficient documentation

## 2024-06-02 DIAGNOSIS — M25572 Pain in left ankle and joints of left foot: Secondary | ICD-10-CM | POA: Diagnosis not present

## 2024-06-02 NOTE — Therapy (Signed)
 OUTPATIENT PHYSICAL THERAPY LOWER EXTREMITY TREATMENT       Patient Name: Sean Mendoza MRN: [id number] DOB:22-Jul-1946, 78 y.o., male Today's Date: 06/03/2024  END OF SESSION:  PT End of Session - 06/02/24 1027     Visit Number 17    Number of Visits 22    Date for PT Re-Evaluation 06/09/24    Authorization Type [employer]    PT Start Time 1025    PT Stop Time 1105    PT Time Calculation (min) 40 min    Activity Tolerance Patient tolerated treatment well    Behavior During Therapy WFL for tasks assessed/performed                        Past Medical History:  Diagnosis Date   Amaurosis fugax    Anxiety    Cough    wert on set 09/2010   HTN (hypertension)    Migraine    PVC's (premature ventricular contractions)    Past Surgical History:  Procedure Laterality Date   ACHILLES TENDON SURGERY Left 01/15/2024   Procedure: LEFT ACHILLES TENDON REPAIR WITH DEBRIDEMENT;  Surgeon: Barton Drape, MD;  Location: [facility];  Service: Orthopedics;  Laterality: Left;   CERVICAL DISCECTOMY     x 2   ELBOW SURGERY Left    x 3   HERNIA REPAIR     inguinal x 2   Patient Active Problem List   Diagnosis Date Noted   PVC's (premature ventricular contractions) 03/15/2023   Orthostatic lightheadedness 12/31/2020   Sleep apnea 03/19/2011   Hypertension 03/19/2011   Right bundle branch block 03/19/2011   COUGH 11/02/2010   DYSLIPIDEMIA 06/15/2010   Palpitations 06/15/2010    PCP: Kip Righter, MD  REFERRING PROVIDER: Barton Drape, MD  REFERRING DIAG: Strain of left Achilles tendon, initial encounter [D13.987J]   THERAPY DIAG:  Muscle weakness (generalized)  Pain in left ankle and joints of left foot  Other abnormalities of gait and mobility  Rationale for Evaluation and Treatment: Rehabilitation  ONSET DATE: 01/15/2024  SUBJECTIVE:   SUBJECTIVE STATEMENT: Pt is 19 weeks and 6 days s/p L achilles  tendon repair.  Pt saw MD on 05/29/24 and he stated everything looks fine and perfect.  Pt states he has no limitations and is able to play golf.  Pt denies pain currently though reports 2.6/10 with ascending stairs.  Pt states his achilles is feeling better.  Pt denies any adverse effects after prior Rx.  Pt states he performed the bike for 30 mins yesterday         PERTINENT HISTORY: Left Achilles tendon open debridement with secondary repair on 01/15/2024 Anxiety, HTN, PVC's, parkinson's  Orthostatic hypotension  PAIN:  Are you having pain? Yes: NPRS scale: 0/10 current, 6/10 worst  Pain location: L achilles tendon Pain description: None at this time.  Aggravating factors: None  Relieving factors: Aleve   PRECAUTIONS: Other: WBAT without boot.   RED FLAGS: None   WEIGHT BEARING RESTRICTIONS: Yes WBAT  FALLS:  Has patient fallen in last 6 months? No  LIVING ENVIRONMENT: Lives with: lives with their spouse Lives in: House/apartment Stairs: Yes: Internal: 14 steps; on right going up and External: 5 steps; bilateral but cannot reach both Has following equipment at home: Vannie - 4 wheeled, shower chair, and Scooter  OCCUPATION: Retired   PLOF: Independent  PATIENT GOALS: Pt would like to get back to golfing.   NEXT  MD VISIT: 2 months.   OBJECTIVE:  Note: Objective measures were completed at Evaluation unless otherwise noted.  DIAGNOSTIC FINDINGS: None recent in Chart.    LOWER EXTREMITY ROM:  Active ROM Right eval Left eval Left 4/1 Right 4/10 Left 4/10 Left 4/17 Left 5/8 Left 5/27  Hip flexion          Hip extension          Hip abduction          Hip adduction          Hip internal rotation          Hip external rotation          Knee flexion          Knee extension          Ankle dorsiflexion 5 Lacking 4 degrees Lacking 2 deg from neutral 17 2 deg 7 deg 10 deg 10  Ankle plantarflexion 40 42      58  Ankle inversion 20 22      33  Ankle eversion 25  25      16    (Blank rows = not tested)   Gait:  Pt ambulating with a good heel to toe gait pattern.  Pt has no limp.  Pt performed symmetrical bilat heel raises without pain.    PATIENT SURVEYS:  LEFS  Initial/Current:  51/80 / 46/80                                                                                                                              TREATMENT:  6/30 Recumbent bike L3-5 x 5 mins Shuttle leg press 3-25's x 15 reps, 4-25's 2x10 Standing heel raises 3x10 Mini squats x10 with UE support Step ups 6 inch 2x10 Step downs 4 inch x 6 reps, 6 inch x 10 reps SLS 3x20 sec with occasional UE support  Pt received scar massage in prone    6/24 - STM to calf and scar tissue mobs - Passive stretching DF in prone -prone hip extension 3x10ea -bridge 2x10 -Sit to stands 2x10 -tandem balance on airex 3x20seconds -SLS on floor and on airex 3x20sec ea  6/21 - STM to calf and scar tissue mobs - Passive stretching and PA grade 4 mobs  - Cat/ Cow - LTR with alt arms into flexion - standing heel raises 2x10  - Leg press 150lbs with focus on slow controlled movements.   6/17 STM/roller to calf and achilles Prone hip extension 2x10 Supine SLR 2x10 S/l abduction 2x10 Bridge with Les on physioball  3x10 LAQ 3# 5 2x10  6/10 Upright bike L5 x9min STM/IASTM using hawk grips to calf and achilles Ankle pumps Supine SLR 2x10 S/l abduction 2x10 Bridge with Les on physioball  3x10 Walking in hall x 23ft   6/2 Elliptical L3 x36min Prone DF stretching with STM to achilles/distal triceps surae SLS on floor (shoe doffed) 2x30sec SL hinge x6 Reverse  lunges at rail x10L Marching on airex (slow) 2x10 Runner step ups 8 2x10L Eccentric step down fwd and lateral 4 2x10ea Sit to stands 2x10    PATIENT EDUCATION:  Education details:  PT answered pt's questions.  Relevant anatomy, exercise form, protocol limits and restrictions, diagnosis, HEP, and POC.     Person  educated: Patient Education method: Medical illustrator, verbal and tactile cues Education comprehension: verbalized understanding and returned demonstration, verbal and tactile cues required  HOME EXERCISE PROGRAM: Access Code: [email or web address] URL: [email or web address] Date: 02/29/2024 Prepared by: Rojean Batten  Exercises - Sidelying Hip Abduction  - 1-2 x daily - 7 x weekly - 3 sets - 10 reps - Active Straight Leg Raise with Quad Set  - 1-2 x daily - 7 x weekly - 3 sets - 10 reps - Seated Long Arc Quad  - 1-2 x daily - 7 x weekly - 3 sets - 10 reps - Supine Ankle Inversion Eversion AROM  - 1-2 x daily - 7 x weekly - 3 sets - 10 reps - Supine Ankle Dorsiflexion and Plantarflexion AROM  - 1-2 x daily - 7 x weekly - 3 sets - 10 reps  Updated HEP: - Long Sitting Ankle Plantar Flexion with Resistance  - 1 x daily - 3-4 x weekly - 2 sets - 10 reps - Long Sitting Ankle Inversion with Anchored Resistance  - 1 x daily - 3-4 x weekly - 2 sets - 10 reps - Long Sitting Ankle Eversion with Resistance  - 1 x daily - 3-4 x weekly - 2 sets - 10 reps  LSVT BIG program provided  ASSESSMENT:  CLINICAL IMPRESSION:  Pt saw MD and had his scar/incision area assessed.  He received a good report from MD.  Pt states his achilles is feeling better.  Pt performed exercises per protocol well and had good tolerance with exercises.  He performed step ups and step downs without any c/o's.  PT continued with with scar massage.  He has improved appearance of scar today with seemingly decreased thickness.  He responded well to Rx reporting no pain after Rx.   OBJECTIVE IMPAIRMENTS: decreased activity tolerance, difficulty walking, decreased balance, decreased endurance, decreased mobility, decreased ROM, decreased strength, impaired flexibility, impaired UE/LE use, postural dysfunction, and pain.  ACTIVITY LIMITATIONS: bending, lifting, carry, locomotion, cleaning, community activity, driving, and or  occupation  PERSONAL FACTORS: Anxiety, HTN, PVC's, parkinson's  are also affecting patient's functional outcome.  REHAB POTENTIAL: Good  CLINICAL DECISION MAKING: Stable/uncomplicated  EVALUATION COMPLEXITY: Low    GOALS: Short term PT Goals Target date: 03/21/2024 Pt will be I and compliant with HEP. Baseline:  Goal status: PARTIALLY MET   Pt will be able to walk without boot for 50% of the day without pain.        Goal status:  GOAL MET  Long term PT goals Target date: 06/09/2024 Pt will improve ROM to [facility] to improve functional mobility Baseline: Goal status: GOAL MET  5/27 Pt will improve hip/knee strength to at least 5-/5 MMT to improve functional strength Baseline: Goal status: ONGOING Pt will improve LEFS to at least 9 points to to show improved function per MCID Baseline: Goal status: NOT MET Pt will reduce pain by overall 50% overall with usual activity Baseline: Goal status: GOAL MET  5/27 Pt will return to golf and participate in other recreational activities without complaints of pain or limitations.  Baseline: Goal status: ONGOING Pt will be able to ambulate community distances  at least 1000 ft WNL gait pattern without complaints Baseline: Goal status: ONGOING         7.  Pt will ascend and descend stairs with a reciprocal gait with the rail.   Goal status:  INITIAL  PLAN: PT FREQUENCY:  2 times per week   PT DURATION:  6 weeks  PLANNED INTERVENTIONS (unless contraindicated): aquatic PT, Canalith repositioning, cryotherapy, Electrical stimulation, Iontophoresis with 4 mg/ml dexamethasome, Moist heat, traction, Ultrasound, gait training, Therapeutic exercise, balance training, neuromuscular re-education, patient/family education, prosthetic training, manual techniques, passive ROM, dry needling, taping, vasopnuematic device, vestibular, spinal manipulations, joint manipulations  PLAN FOR NEXT SESSION: Progress per achilles tendon repair protocol.     Leigh Minerva III PT, DPT 06/03/24 3:55 PM

## 2024-06-09 ENCOUNTER — Encounter (HOSPITAL_BASED_OUTPATIENT_CLINIC_OR_DEPARTMENT_OTHER): Payer: Self-pay

## 2024-06-09 ENCOUNTER — Ambulatory Visit (HOSPITAL_BASED_OUTPATIENT_CLINIC_OR_DEPARTMENT_OTHER)

## 2024-06-09 DIAGNOSIS — M25572 Pain in left ankle and joints of left foot: Secondary | ICD-10-CM | POA: Diagnosis not present

## 2024-06-09 DIAGNOSIS — R600 Localized edema: Secondary | ICD-10-CM

## 2024-06-09 DIAGNOSIS — R2689 Other abnormalities of gait and mobility: Secondary | ICD-10-CM

## 2024-06-09 DIAGNOSIS — R2681 Unsteadiness on feet: Secondary | ICD-10-CM

## 2024-06-09 DIAGNOSIS — M6281 Muscle weakness (generalized): Secondary | ICD-10-CM | POA: Diagnosis not present

## 2024-06-09 NOTE — Therapy (Signed)
 OUTPATIENT PHYSICAL THERAPY LOWER EXTREMITY TREATMENT       Patient Name: Sean Mendoza MRN: [id number] DOB:03/23/1946, 78 y.o., male Today's Date: 06/09/2024  END OF SESSION:  PT End of Session - 06/09/24 0941     Visit Number 18    Number of Visits 22    Date for PT Re-Evaluation 06/09/24    Authorization Type [employer]    PT Start Time 0935    PT Stop Time 1013    PT Time Calculation (min) 38 min    Activity Tolerance Patient tolerated treatment well    Behavior During Therapy WFL for tasks assessed/performed                         Past Medical History:  Diagnosis Date   Amaurosis fugax    Anxiety    Cough    wert on set 09/2010   HTN (hypertension)    Migraine    PVC's (premature ventricular contractions)    Past Surgical History:  Procedure Laterality Date   ACHILLES TENDON SURGERY Left 01/15/2024   Procedure: LEFT ACHILLES TENDON REPAIR WITH DEBRIDEMENT;  Surgeon: Barton Drape, MD;  Location: [facility];  Service: Orthopedics;  Laterality: Left;   CERVICAL DISCECTOMY     x 2   ELBOW SURGERY Left    x 3   HERNIA REPAIR     inguinal x 2   Patient Active Problem List   Diagnosis Date Noted   PVC's (premature ventricular contractions) 03/15/2023   Orthostatic lightheadedness 12/31/2020   Sleep apnea 03/19/2011   Hypertension 03/19/2011   Right bundle branch block 03/19/2011   COUGH 11/02/2010   DYSLIPIDEMIA 06/15/2010   Palpitations 06/15/2010    PCP: Kip Righter, MD  REFERRING PROVIDER: Barton Drape, MD  REFERRING DIAG: Strain of left Achilles tendon, initial encounter [D13.987J]   THERAPY DIAG:  Muscle weakness (generalized)  Pain in left ankle and joints of left foot  Other abnormalities of gait and mobility  Localized edema  Unsteadiness on feet  Rationale for Evaluation and Treatment: Rehabilitation  ONSET DATE: 01/15/2024  SUBJECTIVE:   SUBJECTIVE  STATEMENT: No pain at entry. Some knee stiffness.         PERTINENT HISTORY: Left Achilles tendon open debridement with secondary repair on 01/15/2024 Anxiety, HTN, PVC's, parkinson's  Orthostatic hypotension  PAIN:  Are you having pain? Yes: NPRS scale: 0/10 current, 6/10 worst  Pain location: L achilles tendon Pain description: None at this time.  Aggravating factors: None  Relieving factors: Aleve   PRECAUTIONS: Other: WBAT without boot.   RED FLAGS: None   WEIGHT BEARING RESTRICTIONS: Yes WBAT  FALLS:  Has patient fallen in last 6 months? No  LIVING ENVIRONMENT: Lives with: lives with their spouse Lives in: House/apartment Stairs: Yes: Internal: 14 steps; on right going up and External: 5 steps; bilateral but cannot reach both Has following equipment at home: Vannie - 4 wheeled, shower chair, and Scooter  OCCUPATION: Retired   PLOF: Independent  PATIENT GOALS: Pt would like to get back to golfing.   NEXT MD VISIT: 2 months.   OBJECTIVE:  Note: Objective measures were completed at Evaluation unless otherwise noted.  DIAGNOSTIC FINDINGS: None recent in Chart.    LOWER EXTREMITY ROM:  Active ROM Right eval Left eval Left 4/1 Right 4/10 Left 4/10 Left 4/17 Left 5/8 Left 5/27  Hip flexion          Hip  extension          Hip abduction          Hip adduction          Hip internal rotation          Hip external rotation          Knee flexion          Knee extension          Ankle dorsiflexion 5 Lacking 4 degrees Lacking 2 deg from neutral 17 2 deg 7 deg 10 deg 10  Ankle plantarflexion 40 42      58  Ankle inversion 20 22      33  Ankle eversion 25 25      16    (Blank rows = not tested)   Gait:  Pt ambulating with a good heel to toe gait pattern.  Pt has no limp.  Pt performed symmetrical bilat heel raises without pain.    PATIENT SURVEYS:  LEFS  Initial/Current:  51/80 / 46/80                                                                                                                               TREATMENT:  7/8 Recumbent bike L4 x 5 mins Shuttle leg press 50+12.5 3x10 single leg press with heel raise Standing heel raises 3x10 Mini squats 3x10 with UE support Step ups 6 inch 2x15 Step downs 6inch 2x15 Eccentric HR 2x10L SLS 3x20-30 sec with occasional UE support  Pt received scar massage and roller to calf in prone    6/30 Recumbent bike L3-5 x 5 mins Shuttle leg press 3-25's x 15 reps, 4-25's 2x10 Standing heel raises 3x10 Mini squats x10 with UE support Step ups 6 inch 2x10 Step downs 4 inch x 6 reps, 6 inch x 10 reps SLS 3x20 sec with occasional UE support  Pt received scar massage in prone    6/24 - STM to calf and scar tissue mobs - Passive stretching DF in prone -prone hip extension 3x10ea -bridge 2x10 -Sit to stands 2x10 -tandem balance on airex 3x20seconds -SLS on floor and on airex 3x20sec ea  6/21 - STM to calf and scar tissue mobs - Passive stretching and PA grade 4 mobs  - Cat/ Cow - LTR with alt arms into flexion - standing heel raises 2x10  - Leg press 150lbs with focus on slow controlled movements.   6/17 STM/roller to calf and achilles Prone hip extension 2x10 Supine SLR 2x10 S/l abduction 2x10 Bridge with Les on physioball  3x10 LAQ 3# 5 2x10  6/10 Upright bike L5 x42min STM/IASTM using hawk grips to calf and achilles Ankle pumps Supine SLR 2x10 S/l abduction 2x10 Bridge with Les on physioball  3x10 Walking in hall x 80ft   6/2 Elliptical L3 x57min Prone DF stretching with STM to achilles/distal triceps surae SLS on floor (shoe doffed) 2x30sec SL hinge x6 Reverse lunges at  rail x10L Marching on airex (slow) 2x10 Runner step ups 8 2x10L Eccentric step down fwd and lateral 4 2x10ea Sit to stands 2x10    PATIENT EDUCATION:  Education details:  PT answered pt's questions.  Relevant anatomy, exercise form, protocol limits and restrictions, diagnosis, HEP, and POC.      Person educated: Patient Education method: Medical illustrator, verbal and tactile cues Education comprehension: verbalized understanding and returned demonstration, verbal and tactile cues required  HOME EXERCISE PROGRAM: Access Code: [email or web address] URL: [email or web address] Date: 02/29/2024 Prepared by: Rojean Batten  Exercises - Sidelying Hip Abduction  - 1-2 x daily - 7 x weekly - 3 sets - 10 reps - Active Straight Leg Raise with Quad Set  - 1-2 x daily - 7 x weekly - 3 sets - 10 reps - Seated Long Arc Quad  - 1-2 x daily - 7 x weekly - 3 sets - 10 reps - Supine Ankle Inversion Eversion AROM  - 1-2 x daily - 7 x weekly - 3 sets - 10 reps - Supine Ankle Dorsiflexion and Plantarflexion AROM  - 1-2 x daily - 7 x weekly - 3 sets - 10 reps  Updated HEP: - Long Sitting Ankle Plantar Flexion with Resistance  - 1 x daily - 3-4 x weekly - 2 sets - 10 reps - Long Sitting Ankle Inversion with Anchored Resistance  - 1 x daily - 3-4 x weekly - 2 sets - 10 reps - Long Sitting Ankle Eversion with Resistance  - 1 x daily - 3-4 x weekly - 2 sets - 10 reps  LSVT BIG program provided  ASSESSMENT:  CLINICAL IMPRESSION:  Continued to progress with pain free strengthening and stability interventions. He denied any pain throughout session. Cues required for proper performance with squats and eccentric HR. Good control observed with eccentric focused tasks. Pt will benefit from additional PT to progress functional strength and stability.   OBJECTIVE IMPAIRMENTS: decreased activity tolerance, difficulty walking, decreased balance, decreased endurance, decreased mobility, decreased ROM, decreased strength, impaired flexibility, impaired UE/LE use, postural dysfunction, and pain.  ACTIVITY LIMITATIONS: bending, lifting, carry, locomotion, cleaning, community activity, driving, and or occupation  PERSONAL FACTORS: Anxiety, HTN, PVC's, parkinson's  are also affecting patient's  functional outcome.  REHAB POTENTIAL: Good  CLINICAL DECISION MAKING: Stable/uncomplicated  EVALUATION COMPLEXITY: Low    GOALS: Short term PT Goals Target date: 03/21/2024 Pt will be I and compliant with HEP. Baseline:  Goal status: PARTIALLY MET   Pt will be able to walk without boot for 50% of the day without pain.        Goal status:  GOAL MET  Long term PT goals Target date: 06/09/2024 Pt will improve ROM to [facility] to improve functional mobility Baseline: Goal status: GOAL MET  5/27 Pt will improve hip/knee strength to at least 5-/5 MMT to improve functional strength Baseline: Goal status: ONGOING Pt will improve LEFS to at least 9 points to to show improved function per MCID Baseline: Goal status: NOT MET Pt will reduce pain by overall 50% overall with usual activity Baseline: Goal status: GOAL MET  5/27 Pt will return to golf and participate in other recreational activities without complaints of pain or limitations.  Baseline: Goal status: ONGOING Pt will be able to ambulate community distances at least 1000 ft WNL gait pattern without complaints Baseline: Goal status: ONGOING         7.  Pt will ascend and descend stairs with a reciprocal gait with the rail.  Goal status:  INITIAL  PLAN: PT FREQUENCY:  2 times per week   PT DURATION:  6 weeks  PLANNED INTERVENTIONS (unless contraindicated): aquatic PT, Canalith repositioning, cryotherapy, Electrical stimulation, Iontophoresis with 4 mg/ml dexamethasome, Moist heat, traction, Ultrasound, gait training, Therapeutic exercise, balance training, neuromuscular re-education, patient/family education, prosthetic training, manual techniques, passive ROM, dry needling, taping, vasopnuematic device, vestibular, spinal manipulations, joint manipulations  PLAN FOR NEXT SESSION: Progress per achilles tendon repair protocol.     Asberry Rodes, PTA  06/09/24 10:42 AM

## 2024-06-12 ENCOUNTER — Encounter (HOSPITAL_BASED_OUTPATIENT_CLINIC_OR_DEPARTMENT_OTHER): Payer: Self-pay | Admitting: Physical Therapy

## 2024-06-12 ENCOUNTER — Ambulatory Visit (HOSPITAL_BASED_OUTPATIENT_CLINIC_OR_DEPARTMENT_OTHER): Admitting: Physical Therapy

## 2024-06-12 DIAGNOSIS — R2689 Other abnormalities of gait and mobility: Secondary | ICD-10-CM | POA: Diagnosis not present

## 2024-06-12 DIAGNOSIS — R600 Localized edema: Secondary | ICD-10-CM

## 2024-06-12 DIAGNOSIS — M6281 Muscle weakness (generalized): Secondary | ICD-10-CM | POA: Diagnosis not present

## 2024-06-12 DIAGNOSIS — M25572 Pain in left ankle and joints of left foot: Secondary | ICD-10-CM

## 2024-06-12 DIAGNOSIS — R2681 Unsteadiness on feet: Secondary | ICD-10-CM | POA: Diagnosis not present

## 2024-06-12 NOTE — Therapy (Unsigned)
 OUTPATIENT PHYSICAL THERAPY LOWER EXTREMITY TREATMENT       Patient Name: Sean Mendoza MRN: [id number] DOB:[birth year], 78 y.o., male Today's Date: 06/13/2024  END OF SESSION:  PT End of Session - 06/12/24 0944     Visit Number 19    Number of Visits 21    Date for PT Re-Evaluation 07/03/24    Authorization Type [employer]    PT Start Time [phone]    PT Stop Time 1024    PT Time Calculation (min) 43 min    Activity Tolerance Patient tolerated treatment well    Behavior During Therapy [facility] for tasks assessed/performed                          Past Medical History:  Diagnosis Date   Amaurosis fugax    Anxiety    Cough    wert on set 09/2010   HTN (hypertension)    Migraine    PVC's (premature ventricular contractions)    Past Surgical History:  Procedure Laterality Date   ACHILLES TENDON SURGERY Left 01/15/2024   Procedure: LEFT ACHILLES TENDON REPAIR WITH DEBRIDEMENT;  Surgeon: Barton Drape, MD;  Location: [facility];  Service: Orthopedics;  Laterality: Left;   CERVICAL DISCECTOMY     x 2   ELBOW SURGERY Left    x 3   HERNIA REPAIR     inguinal x 2   Patient Active Problem List   Diagnosis Date Noted   PVC's (premature ventricular contractions) 03/15/2023   Orthostatic lightheadedness 12/31/2020   Sleep apnea 03/19/2011   Hypertension 03/19/2011   Right bundle branch block 03/19/2011   COUGH 11/02/2010   DYSLIPIDEMIA 06/15/2010   Palpitations 06/15/2010    PCP: Kip Righter, MD  REFERRING PROVIDER: Barton Drape, MD  REFERRING DIAG: Strain of left Achilles tendon, initial encounter [D13.987J]   THERAPY DIAG:  Muscle weakness (generalized)  Pain in left ankle and joints of left foot  Other abnormalities of gait and mobility  Localized edema  Rationale for Evaluation and Treatment: Rehabilitation  ONSET DATE: 01/15/2024  SUBJECTIVE:   SUBJECTIVE STATEMENT: Pt is 21 weeks and 2  days Left Achilles tendon open debridement with secondary repair.  Pt hasn't been performing his HEP as much but has been very active.  He is performing yard work without increased pain.  Pt has been coming to the gym some.  He had a good workout yesterday and was able to perform the elliptical for 20 mins without UE support. He denies pain currently.  He denies any adverse effects after prior Rx.  Pt has not played golf yet.  He was going to try it, but it was raining.          PERTINENT HISTORY: Left Achilles tendon open debridement with secondary repair on 01/15/2024 Anxiety, HTN, PVC's, parkinson's  Orthostatic hypotension  PAIN:  Are you having pain? Yes: NPRS scale: 0/10 current, 3-4/10 worst  Pain location: L achilles tendon Pain description: None at this time.  Aggravating factors: None  Relieving factors: Aleve   PRECAUTIONS: Other: WBAT without boot.   RED FLAGS: None   WEIGHT BEARING RESTRICTIONS: Yes WBAT  FALLS:  Has patient fallen in last 6 months? No  LIVING ENVIRONMENT: Lives with: lives with their spouse Lives in: House/apartment Stairs: Yes: Internal: 14 steps; on right going up and External: 5 steps; bilateral but cannot reach both Has following equipment at home: Walker - 4  wheeled, shower chair, and Scooter  OCCUPATION: Retired   PLOF: Independent  PATIENT GOALS: Pt would like to get back to golfing.   NEXT MD VISIT: 2 months.   OBJECTIVE:  Note: Objective measures were completed at Evaluation unless otherwise noted.  DIAGNOSTIC FINDINGS: None recent in Chart.    LOWER EXTREMITY ROM:  Active ROM Right eval Left eval Left 4/1 Right 4/10 Left 4/10 Left 4/17 Left 5/8 Left 5/27 Left 7/11  Hip flexion           Hip extension           Hip abduction           Hip adduction           Hip internal rotation           Hip external rotation           Knee flexion           Knee extension           Ankle dorsiflexion 5 Lacking 4 degrees  Lacking 2 deg from neutral 17 2 deg 7 deg 10 deg 10 14  Ankle plantarflexion 40 42      58 59  Ankle inversion 20 22      33 30  Ankle eversion 25 25      16 20    (Blank rows = not tested)   Gait:  Pt ambulating with a good heel to toe gait pattern.  Pt has no limp.  Strength: (MMT) 5/5 in L ankle DF, Inv, and Eve Pt tolerated good manual resistance in PF seated. 5/5 in L hip flexion and abd and knee extension Pt performed symmetrical bilat heel raises without pain.    OBSERVATION:  Incision closed.  Pt has thickened area at incision.    PATIENT SURVEYS:  LEFS  Initial/Prior/Current:  51/80 / 46/80 / 63/80  STAIRS:  Pt performed stairs with a reciprocal gait without the rail.                                                                                                                               TREATMENT:  7/11 Reviewed current function, HEP compliance, pain levels and response to prior Rx.  Assessed gait, ROM, and strength.  See above. LEFS:  63/80  Step downs 2x10 Bilat heel raises x10 Eccentric HR 2x10 L LE  7/8 Recumbent bike L4 x 5 mins Shuttle leg press 50+12.5 3x10 single leg press with heel raise Standing heel raises 3x10 Mini squats 3x10 with UE support Step ups 6 inch 2x15 Step downs 6inch 2x15 Eccentric HR 2x10L SLS 3x20-30 sec with occasional UE support  Pt received scar massage and roller to calf in prone    6/30 Recumbent bike L3-5 x 5 mins Shuttle leg press 3-25's x 15 reps, 4-25's 2x10 Standing heel raises 3x10 Mini squats x10 with UE support Step ups 6 inch 2x10 Step downs  4 inch x 6 reps, 6 inch x 10 reps SLS 3x20 sec with occasional UE support  Pt received scar massage in prone    6/24 - STM to calf and scar tissue mobs - Passive stretching DF in prone -prone hip extension 3x10ea -bridge 2x10 -Sit to stands 2x10 -tandem balance on airex 3x20seconds -SLS on floor and on airex 3x20sec ea  6/21 - STM to calf and scar tissue  mobs - Passive stretching and PA grade 4 mobs  - Cat/ Cow - LTR with alt arms into flexion - standing heel raises 2x10  - Leg press 150lbs with focus on slow controlled movements.      PATIENT EDUCATION:  Education details:  PT answered pt's questions.  Relevant anatomy, exercise form, protocol limits and restrictions, diagnosis, HEP, and POC.     Person educated: Patient Education method: Medical illustrator, verbal and tactile cues Education comprehension: verbalized understanding and returned demonstration, verbal and tactile cues required  HOME EXERCISE PROGRAM: Access Code: [email or web address] URL: [email or web address] Date: 02/29/2024 Prepared by: Rojean Batten  Exercises - Sidelying Hip Abduction  - 1-2 x daily - 7 x weekly - 3 sets - 10 reps - Active Straight Leg Raise with Quad Set  - 1-2 x daily - 7 x weekly - 3 sets - 10 reps - Seated Long Arc Quad  - 1-2 x daily - 7 x weekly - 3 sets - 10 reps - Supine Ankle Inversion Eversion AROM  - 1-2 x daily - 7 x weekly - 3 sets - 10 reps - Supine Ankle Dorsiflexion and Plantarflexion AROM  - 1-2 x daily - 7 x weekly - 3 sets - 10 reps  Updated HEP: - Long Sitting Ankle Plantar Flexion with Resistance  - 1 x daily - 3-4 x weekly - 2 sets - 10 reps - Long Sitting Ankle Inversion with Anchored Resistance  - 1 x daily - 3-4 x weekly - 2 sets - 10 reps - Long Sitting Ankle Eversion with Resistance  - 1 x daily - 3-4 x weekly - 2 sets - 10 reps  LSVT BIG program provided  ASSESSMENT:  CLINICAL IMPRESSION:  Pt is 21 weeks and 2 days Left Achilles tendon open debridement with secondary repair and making excellent progress.  Pt has improved tolerance with activity as evidenced by increased performance of activities.  Pt has a normal gait and is ambulating increased community distance without c/o's.  Pt is performing yard work without increased pain.  He has also been going to the gym some.  Pt report he is not limited with  his daily mobility and activities though hasn't played golf yet.  Pt ascended and descended stairs with a reciprocal gait without the rail without difficulty.  Pt demonstrates clinically significant improvement in self perceived disability with LEFS improving from 46/80 prior to 63/80 currently.  PT assessed strength in L LE and he has good strength in L LE.  Pt has 5/5 strength in ankle DF, Eve, and Inv.  Pt has symmetrical bilat heel raise and is able to eccentrically lower on just his L LE.  Pt has made good progress with ankle ROM.  Pt met STG #2 and LTG's # 1-4 and 6,7.  Pt partially met STG #1.  PT updated goals with a new goal of independence with final HEP.  He should benefit from 1-2 more sessions to ensure independence with final HEP.      OBJECTIVE IMPAIRMENTS: decreased activity tolerance, difficulty walking, decreased  balance, decreased endurance, decreased mobility, decreased ROM, decreased strength, impaired flexibility, impaired UE/LE use, postural dysfunction, and pain.  ACTIVITY LIMITATIONS: bending, lifting, carry, locomotion, cleaning, community activity, driving, and or occupation  PERSONAL FACTORS: Anxiety, HTN, PVC's, parkinson's  are also affecting patient's functional outcome.  REHAB POTENTIAL: Good  CLINICAL DECISION MAKING: Stable/uncomplicated  EVALUATION COMPLEXITY: Low    GOALS: Short term PT Goals Target date: 03/21/2024 Pt will be I and compliant with HEP. Baseline:  Goal status: PARTIALLY MET   Pt will be able to walk without boot for 50% of the day without pain.        Goal status:  GOAL MET  Long term PT goals Target date: 06/09/2024 Pt will improve ROM to [facility] to improve functional mobility Baseline: Goal status: GOAL MET  5/27 Pt will improve hip/knee strength to at least 5-/5 MMT to improve functional strength Baseline: Goal status: GOAL MET  7/11 Pt will improve LEFS to at least 9 points to to show improved function per MCID Baseline: Goal  status: GOAL MET  7/11 Pt will reduce pain by overall 50% overall with usual activity Baseline: Goal status: GOAL MET  5/27 Pt will return to golf and participate in other recreational activities without complaints of pain or limitations.  Baseline: Goal status: ONGOING Pt will be able to ambulate community distances at least 1000 ft WNL gait pattern without complaints Baseline: Goal status: GOAL MET  7/11       7.  Pt will ascend and descend stairs with a reciprocal gait with the rail.   Goal status:  GOAL MET  7/11      8.  Pt will be independent with final HEP to establish a consistent routine to maximize functional mobility and tolerance.   Goal status:  INITIAL   Target: 06/26/2024   PLAN: PT FREQUENCY:  1-2 times per week, 2 more visits   PT DURATION:  3 weeks  PLANNED INTERVENTIONS (unless contraindicated): aquatic PT, Canalith repositioning, cryotherapy, Electrical stimulation, Iontophoresis with 4 mg/ml dexamethasome, Moist heat, traction, Ultrasound, gait training, Therapeutic exercise, balance training, neuromuscular re-education, patient/family education, prosthetic training, manual techniques, passive ROM, dry needling, taping, vasopnuematic device, vestibular, spinal manipulations, joint manipulations  PLAN FOR NEXT SESSION: Progress per achilles tendon repair protocol.  Possibly 2 more sessions then discharge.  Work on Psychologist, sport and exercise.   Leigh Minerva III PT, DPT 06/13/24 2:38 PM

## 2024-06-16 ENCOUNTER — Ambulatory Visit (HOSPITAL_BASED_OUTPATIENT_CLINIC_OR_DEPARTMENT_OTHER)

## 2024-06-16 ENCOUNTER — Encounter (HOSPITAL_BASED_OUTPATIENT_CLINIC_OR_DEPARTMENT_OTHER): Payer: Self-pay

## 2024-06-16 DIAGNOSIS — R600 Localized edema: Secondary | ICD-10-CM | POA: Diagnosis not present

## 2024-06-16 DIAGNOSIS — R2689 Other abnormalities of gait and mobility: Secondary | ICD-10-CM | POA: Diagnosis not present

## 2024-06-16 DIAGNOSIS — M25572 Pain in left ankle and joints of left foot: Secondary | ICD-10-CM | POA: Diagnosis not present

## 2024-06-16 DIAGNOSIS — M6281 Muscle weakness (generalized): Secondary | ICD-10-CM | POA: Diagnosis not present

## 2024-06-16 DIAGNOSIS — R2681 Unsteadiness on feet: Secondary | ICD-10-CM

## 2024-06-16 NOTE — Therapy (Cosign Needed)
 OUTPATIENT PHYSICAL THERAPY LOWER EXTREMITY TREATMENT       Patient Name: Sean Mendoza MRN: [id number] DOB:05/23/1946, 78 y.o., male Today's Date: 06/16/2024  END OF SESSION:  PT End of Session - 06/16/24 0942     Visit Number 20    Number of Visits 21    Date for PT Re-Evaluation 07/03/24    Authorization Type [employer]    PT Start Time 0935    PT Stop Time 1015    PT Time Calculation (min) 40 min    Activity Tolerance Patient tolerated treatment well    Behavior During Therapy WFL for tasks assessed/performed                           Past Medical History:  Diagnosis Date   Amaurosis fugax    Anxiety    Cough    wert on set 09/2010   HTN (hypertension)    Migraine    PVC's (premature ventricular contractions)    Past Surgical History:  Procedure Laterality Date   ACHILLES TENDON SURGERY Left 01/15/2024   Procedure: LEFT ACHILLES TENDON REPAIR WITH DEBRIDEMENT;  Surgeon: Barton Drape, MD;  Location: [facility];  Service: Orthopedics;  Laterality: Left;   CERVICAL DISCECTOMY     x 2   ELBOW SURGERY Left    x 3   HERNIA REPAIR     inguinal x 2   Patient Active Problem List   Diagnosis Date Noted   PVC's (premature ventricular contractions) 03/15/2023   Orthostatic lightheadedness 12/31/2020   Sleep apnea 03/19/2011   Hypertension 03/19/2011   Right bundle branch block 03/19/2011   COUGH 11/02/2010   DYSLIPIDEMIA 06/15/2010   Palpitations 06/15/2010    PCP: Kip Righter, MD  REFERRING PROVIDER: Barton Drape, MD  REFERRING DIAG: Strain of left Achilles tendon, initial encounter [D13.987J]   THERAPY DIAG:  Muscle weakness (generalized)  Pain in left ankle and joints of left foot  Other abnormalities of gait and mobility  Localized edema  Unsteadiness on feet  Rationale for Evaluation and Treatment: Rehabilitation  ONSET DATE: 01/15/2024  SUBJECTIVE:   SUBJECTIVE  STATEMENT: Pt reports his achilles is always sore and tight in the morning. It loosens up after I walk. Pt reports he has hit golf balls without issue. Has not yet played a full round of golf.         PERTINENT HISTORY: Left Achilles tendon open debridement with secondary repair on 01/15/2024 Anxiety, HTN, PVC's, parkinson's  Orthostatic hypotension  PAIN:  Are you having pain? Yes: NPRS scale: 0/10 current, 3-4/10 worst  Pain location: L achilles tendon Pain description: None at this time.  Aggravating factors: None  Relieving factors: Aleve   PRECAUTIONS: Other: WBAT without boot.   RED FLAGS: None   WEIGHT BEARING RESTRICTIONS: Yes WBAT  FALLS:  Has patient fallen in last 6 months? No  LIVING ENVIRONMENT: Lives with: lives with their spouse Lives in: House/apartment Stairs: Yes: Internal: 14 steps; on right going up and External: 5 steps; bilateral but cannot reach both Has following equipment at home: Vannie - 4 wheeled, shower chair, and Scooter  OCCUPATION: Retired   PLOF: Independent  PATIENT GOALS: Pt would like to get back to golfing.   NEXT MD VISIT: 2 months.   OBJECTIVE:  Note: Objective measures were completed at Evaluation unless otherwise noted.  DIAGNOSTIC FINDINGS: None recent in Chart.    LOWER EXTREMITY ROM:  Active ROM Right eval Left eval Left 4/1 Right 4/10 Left 4/10 Left 4/17 Left 5/8 Left 5/27 Left 7/11  Hip flexion           Hip extension           Hip abduction           Hip adduction           Hip internal rotation           Hip external rotation           Knee flexion           Knee extension           Ankle dorsiflexion 5 Lacking 4 degrees Lacking 2 deg from neutral 17 2 deg 7 deg 10 deg 10 14  Ankle plantarflexion 40 42      58 59  Ankle inversion 20 22      33 30  Ankle eversion 25 25      16 20    (Blank rows = not tested)   Gait:  Pt ambulating with a good heel to toe gait pattern.  Pt has no  limp.  Strength: (MMT) 5/5 in L ankle DF, Inv, and Eve Pt tolerated good manual resistance in PF seated. 5/5 in L hip flexion and abd and knee extension Pt performed symmetrical bilat heel raises without pain.    OBSERVATION:  Incision closed.  Pt has thickened area at incision.    PATIENT SURVEYS:  LEFS  Initial/Prior/Current:  51/80 / 46/80 / 63/80  STAIRS:  Pt performed stairs with a reciprocal gait without the rail.                                                                                                                               TREATMENT:  7/15 Elliptical L2 Step ups 8 2x15 HR 2x10 Eccentric HR 2x10 STS 3x10 Eccentric fwd step down 6 x5 (pt reported too much quad fatigue with this to stopped) HEP update and review      7/11 Reviewed current function, HEP compliance, pain levels and response to prior Rx.  Assessed gait, ROM, and strength.  See above. LEFS:  63/80  Step downs 2x10 Bilat heel raises x10 Eccentric HR 2x10 L LE     PATIENT EDUCATION:  Education details:  PT answered pt's questions.  Relevant anatomy, exercise form, protocol limits and restrictions, diagnosis, HEP, and POC.     Person educated: Patient Education method: Medical illustrator, verbal and tactile cues Education comprehension: verbalized understanding and returned demonstration, verbal and tactile cues required  HOME EXERCISE PROGRAM: Access Code: [email or web address] URL: [email or web address] Date: 02/29/2024 Prepared by: Rojean Batten  Exercises - Sidelying Hip Abduction  - 1-2 x daily - 7 x weekly - 3 sets - 10 reps - Active Straight Leg Raise with Quad Set  - 1-2 x daily - 7 x weekly - 3 sets - 10  reps - Seated Long Arc Quad  - 1-2 x daily - 7 x weekly - 3 sets - 10 reps - Supine Ankle Inversion Eversion AROM  - 1-2 x daily - 7 x weekly - 3 sets - 10 reps - Supine Ankle Dorsiflexion and Plantarflexion AROM  - 1-2 x daily - 7 x weekly - 3 sets - 10  reps  Updated HEP: - Long Sitting Ankle Plantar Flexion with Resistance  - 1 x daily - 3-4 x weekly - 2 sets - 10 reps - Long Sitting Ankle Inversion with Anchored Resistance  - 1 x daily - 3-4 x weekly - 2 sets - 10 reps - Long Sitting Ankle Eversion with Resistance  - 1 x daily - 3-4 x weekly - 2 sets - 10 reps  LSVT BIG program provided  ASSESSMENT:  CLINICAL IMPRESSION:  Pt has attended 20 visits of PT for achilles tendon repair. He has made excellent progress. He has met all goals aside from golf related goal as he has not yet played a full game. MD informed pt that swelling/bulging at incision area can last for another year. He continues to don ankle compression sleeve. Denies pain or difficulty with ADLs. Pt has made significant progress towards goals- please refer to PN last visit. Updated and reviewed final HEP with pt. Discussed gym program progressions as well. Pt is ready for d/c at this time.    OBJECTIVE IMPAIRMENTS: decreased activity tolerance, difficulty walking, decreased balance, decreased endurance, decreased mobility, decreased ROM, decreased strength, impaired flexibility, impaired UE/LE use, postural dysfunction, and pain.  ACTIVITY LIMITATIONS: bending, lifting, carry, locomotion, cleaning, community activity, driving, and or occupation  PERSONAL FACTORS: Anxiety, HTN, PVC's, parkinson's  are also affecting patient's functional outcome.  REHAB POTENTIAL: Good  CLINICAL DECISION MAKING: Stable/uncomplicated  EVALUATION COMPLEXITY: Low    GOALS: Short term PT Goals Target date: 03/21/2024 Pt will be I and compliant with HEP. Baseline:  Goal status: MET 7/15 Pt will be able to walk without boot for 50% of the day without pain.        Goal status:  GOAL MET  Long term PT goals Target date: 06/09/2024 Pt will improve ROM to [facility] to improve functional mobility Baseline: Goal status: GOAL MET  5/27 Pt will improve hip/knee strength to at least 5-/5 MMT to  improve functional strength Baseline: Goal status: GOAL MET  7/11 Pt will improve LEFS to at least 9 points to to show improved function per MCID Baseline: Goal status: GOAL MET  7/11 Pt will reduce pain by overall 50% overall with usual activity Baseline: Goal status: GOAL MET  5/27 Pt will return to golf and participate in other recreational activities without complaints of pain or limitations.  Baseline: Goal status: ONGOING Pt will be able to ambulate community distances at least 1000 ft WNL gait pattern without complaints Baseline: Goal status: GOAL MET  7/11       7.  Pt will ascend and descend stairs with a reciprocal gait with the rail.   Goal status:  GOAL MET  7/11      8.  Pt will be independent with final HEP to establish a consistent routine to maximize functional mobility and tolerance.   Goal status:  MET 7/15   Target: 06/26/2024   PLAN: PT FREQUENCY:  1-2 times per week, 2 more visits   PT DURATION:  3 weeks  PLANNED INTERVENTIONS (unless contraindicated): aquatic PT, Canalith repositioning, cryotherapy, Electrical stimulation, Iontophoresis with 4 mg/ml  dexamethasome, Moist heat, traction, Ultrasound, gait training, Therapeutic exercise, balance training, neuromuscular re-education, patient/family education, prosthetic training, manual techniques, passive ROM, dry needling, taping, vasopnuematic device, vestibular, spinal manipulations, joint manipulations  PLAN FOR NEXT SESSION: Progress per achilles tendon repair protocol.  Possibly 2 more sessions then discharge.  Work on Psychologist, sport and exercise.   Asberry Rodes, PTA  06/16/24 11:04 AM

## 2024-06-18 NOTE — Addendum Note (Signed)
 Addended by: MARGRETTE LEIGH RAMAN on: 06/18/2024 09:30 AM   Modules accepted: Orders

## 2024-06-19 ENCOUNTER — Encounter (HOSPITAL_BASED_OUTPATIENT_CLINIC_OR_DEPARTMENT_OTHER): Admitting: Physical Therapy

## 2024-06-23 ENCOUNTER — Encounter (HOSPITAL_BASED_OUTPATIENT_CLINIC_OR_DEPARTMENT_OTHER)

## 2024-06-26 ENCOUNTER — Encounter (HOSPITAL_BASED_OUTPATIENT_CLINIC_OR_DEPARTMENT_OTHER): Admitting: Physical Therapy

## 2024-07-02 DIAGNOSIS — K080 Exfoliation of teeth due to systemic causes: Secondary | ICD-10-CM | POA: Diagnosis not present

## 2024-07-07 DIAGNOSIS — K080 Exfoliation of teeth due to systemic causes: Secondary | ICD-10-CM | POA: Diagnosis not present

## 2024-07-13 DIAGNOSIS — L570 Actinic keratosis: Secondary | ICD-10-CM | POA: Diagnosis not present

## 2024-08-04 DIAGNOSIS — G903 Multi-system degeneration of the autonomic nervous system: Secondary | ICD-10-CM | POA: Diagnosis not present

## 2024-08-04 DIAGNOSIS — G4719 Other hypersomnia: Secondary | ICD-10-CM | POA: Diagnosis not present

## 2024-08-11 DIAGNOSIS — I10 Essential (primary) hypertension: Secondary | ICD-10-CM | POA: Diagnosis not present

## 2024-08-11 DIAGNOSIS — R0683 Snoring: Secondary | ICD-10-CM | POA: Diagnosis not present

## 2024-08-11 DIAGNOSIS — G4710 Hypersomnia, unspecified: Secondary | ICD-10-CM | POA: Diagnosis not present

## 2024-08-11 DIAGNOSIS — G4752 REM sleep behavior disorder: Secondary | ICD-10-CM | POA: Diagnosis not present

## 2024-08-25 DIAGNOSIS — S86012A Strain of left Achilles tendon, initial encounter: Secondary | ICD-10-CM | POA: Diagnosis not present

## 2024-08-31 DIAGNOSIS — K080 Exfoliation of teeth due to systemic causes: Secondary | ICD-10-CM | POA: Diagnosis not present

## 2024-09-06 DIAGNOSIS — R0683 Snoring: Secondary | ICD-10-CM | POA: Diagnosis not present

## 2024-09-07 DIAGNOSIS — R0683 Snoring: Secondary | ICD-10-CM | POA: Diagnosis not present

## 2024-09-17 DIAGNOSIS — I10 Essential (primary) hypertension: Secondary | ICD-10-CM | POA: Diagnosis not present

## 2024-09-17 DIAGNOSIS — S50811A Abrasion of right forearm, initial encounter: Secondary | ICD-10-CM | POA: Diagnosis not present

## 2024-09-17 DIAGNOSIS — Y9289 Other specified places as the place of occurrence of the external cause: Secondary | ICD-10-CM | POA: Diagnosis not present

## 2024-09-17 DIAGNOSIS — W228XXA Striking against or struck by other objects, initial encounter: Secondary | ICD-10-CM | POA: Diagnosis not present

## 2024-09-17 DIAGNOSIS — G20A1 Parkinson's disease without dyskinesia, without mention of fluctuations: Secondary | ICD-10-CM | POA: Diagnosis not present

## 2024-09-17 DIAGNOSIS — S40811A Abrasion of right upper arm, initial encounter: Secondary | ICD-10-CM | POA: Diagnosis not present

## 2024-10-06 DIAGNOSIS — E785 Hyperlipidemia, unspecified: Secondary | ICD-10-CM | POA: Diagnosis not present

## 2024-10-06 DIAGNOSIS — I10 Essential (primary) hypertension: Secondary | ICD-10-CM | POA: Diagnosis not present

## 2024-10-06 DIAGNOSIS — R7309 Other abnormal glucose: Secondary | ICD-10-CM | POA: Diagnosis not present

## 2024-10-08 DIAGNOSIS — E785 Hyperlipidemia, unspecified: Secondary | ICD-10-CM | POA: Diagnosis not present

## 2024-10-08 DIAGNOSIS — I10 Essential (primary) hypertension: Secondary | ICD-10-CM | POA: Diagnosis not present

## 2024-10-08 DIAGNOSIS — G20A1 Parkinson's disease without dyskinesia, without mention of fluctuations: Secondary | ICD-10-CM | POA: Diagnosis not present

## 2024-10-08 DIAGNOSIS — F411 Generalized anxiety disorder: Secondary | ICD-10-CM | POA: Diagnosis not present

## 2024-10-08 DIAGNOSIS — Z23 Encounter for immunization: Secondary | ICD-10-CM | POA: Diagnosis not present

## 2024-12-30 ENCOUNTER — Ambulatory Visit: Attending: Physician Assistant | Admitting: Cardiology

## 2024-12-30 ENCOUNTER — Ambulatory Visit

## 2024-12-30 VITALS — BP 116/74 | HR 60 | Ht 72.0 in | Wt 210.0 lb

## 2024-12-30 DIAGNOSIS — F422 Mixed obsessional thoughts and acts: Secondary | ICD-10-CM | POA: Diagnosis not present

## 2024-12-30 DIAGNOSIS — R002 Palpitations: Secondary | ICD-10-CM

## 2024-12-30 DIAGNOSIS — I493 Ventricular premature depolarization: Secondary | ICD-10-CM | POA: Diagnosis not present

## 2024-12-30 DIAGNOSIS — I452 Bifascicular block: Secondary | ICD-10-CM | POA: Diagnosis not present

## 2024-12-30 DIAGNOSIS — R42 Dizziness and giddiness: Secondary | ICD-10-CM | POA: Diagnosis not present

## 2024-12-30 DIAGNOSIS — F331 Major depressive disorder, recurrent, moderate: Secondary | ICD-10-CM | POA: Diagnosis not present

## 2024-12-30 LAB — COMPREHENSIVE METABOLIC PANEL WITH GFR
ALT: 20 [IU]/L (ref 0–44)
AST: 29 [IU]/L (ref 0–40)
Albumin: 4.3 g/dL (ref 3.8–4.8)
Alkaline Phosphatase: 97 [IU]/L (ref 47–123)
BUN/Creatinine Ratio: 15 (ref 10–24)
BUN: 17 mg/dL (ref 8–27)
Bilirubin Total: 1.3 mg/dL — ABNORMAL HIGH (ref 0.0–1.2)
CO2: 24 mmol/L (ref 20–29)
Calcium: 9.5 mg/dL (ref 8.6–10.2)
Chloride: 102 mmol/L (ref 96–106)
Creatinine, Ser: 1.13 mg/dL (ref 0.76–1.27)
Globulin, Total: 2.2 g/dL (ref 1.5–4.5)
Glucose: 76 mg/dL (ref 70–99)
Potassium: 4.7 mmol/L (ref 3.5–5.2)
Sodium: 141 mmol/L (ref 134–144)
Total Protein: 6.5 g/dL (ref 6.0–8.5)
eGFR: 67 mL/min/{1.73_m2}

## 2024-12-30 LAB — LIPID PANEL
Chol/HDL Ratio: 2.7 ratio (ref 0.0–5.0)
Cholesterol, Total: 161 mg/dL (ref 100–199)
HDL: 60 mg/dL
LDL Chol Calc (NIH): 85 mg/dL (ref 0–99)
Triglycerides: 85 mg/dL (ref 0–149)
VLDL Cholesterol Cal: 16 mg/dL (ref 5–40)

## 2024-12-30 LAB — MAGNESIUM: Magnesium: 2.4 mg/dL — ABNORMAL HIGH (ref 1.6–2.3)

## 2024-12-30 MED ORDER — FLUOXETINE HCL 40 MG PO CAPS: 40.0000 mg | ORAL_CAPSULE | Freq: Every day | ORAL | Status: AC

## 2024-12-30 MED ORDER — ATORVASTATIN CALCIUM 10 MG PO TABS: 20.0000 mg | ORAL_TABLET | Freq: Every day | ORAL | Status: AC

## 2024-12-30 NOTE — Progress Notes (Signed)
 " Electrophysiology Office Note:   Date:  12/30/2024  ID:  Shelly, Shoultz 16-Feb-1946, MRN [id number]  Primary Cardiologist: None Electrophysiologist: None        History of Present Illness:   KENJI MAPEL is a 79 y.o. male with h/o PVCs, Parkinson disease, orthostatic hypotension/dysautonomia, hypertension, Gilbert's seen today for acute visit due to palpitations.    Since last visit, patient has established with Dr. Rosalia with [facility]. Per notes from this visit, patient's ongoing orthostatic hypotension discussed with Dr. Rosalia. Patient was encouraged to follow up with cardiology regarding the need for ongoing diltiazem  given potential for worsening his orthostatic symptoms. A referral was placed to vascular medicine and patient was seen by Dr. Beryl on 12/17/24. Per notes from that visit:  Today on physical examination, the patient had typical symptoms and signs of chronic venous insufficiency. For that, I would like to perform a lower extremity venous insufficiency study at the time of the next visit. Since the patient is residing in [place], I offered him to be followed at our [facility] and have a vascular study done at [facility]. 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 I believe that the patient had at least mild dysautonomia even before COVID infection, which became worse since the COVID infection. The patient with chronic venous insufficiency, which clinically was present for decades, in part contributed by multiple travels. The patient also had 2 neck surgeries, which could potentially contribute to some dysregulation in the blood pressure.  Patient now pending vascular studies.  Patient presents to clinic today with his spouse.  Patient states that the reason for today's visit is an episode of palpitations/irregular heartbeat sensation that he experienced a few nights ago.  Patient expresses concern  today that this could have been an episode of atrial fibrillation.  Otherwise he feels that his palpitations/PVCs have been generally well-controlled.  He notes that he only feels these at rest and states I can almost make them happen because I always feel them when I check my pulse.  Patient is back to increased physical activity and regularly goes to the gym.  Has no cardiac symptoms with exertion.  Regarding dizziness and orthostatic symptoms, essentially stable symptoms per patient.  He continues to wear compression stockings most of the time.  Patient reports concern today regarding erectile dysfunction.  He states that he has decreased his fluoxetine  to 20 mg.  Patient expresses an interest in switching to Wellbutrin.  Patient reports that he has recently noticed occasional bilateral leg weakness.  He wonders if this could be because of his medicines or if this is a result of his Parkinson's.  Has decreased his atorvastatin  to 10 mg out of concern that this was contributing to his leg weakness.  Is now interested to see a what his cholesterol numbers look like on a lower dose.  Review of systems complete and found to be negative unless listed in HPI.   EP Information / Studies Reviewed:    EKG is ordered today. Personal review as below.       Arrhythmia/Device History No specialty comments available.   Physical Exam:   VS:  There were no vitals taken for this visit.   Wt Readings from Last 3 Encounters:  03/23/24 209 lb 9.6 oz (95.1 kg)  01/15/24 206 lb 9.1 oz (93.7 kg)  03/15/23 211 lb 9.6 oz (96 kg)     GEN: No acute distress NECK: No JVD; No  carotid bruits CARDIAC: Regular rate and rhythm, no murmurs, rubs, gallops RESPIRATORY:  Clear to auscultation without rales, wheezing or rhonchi  ABDOMEN: Soft, non-tender, non-distended EXTREMITIES:  No edema; No deformity   ASSESSMENT AND PLAN:    PVCs Palpitations Bifascicular block Per patient, had an episode of irregular  heartbeat sensation/palpitations a few nights ago, states he was concerned that this could have been atrial fibrillation.  Otherwise reports infrequent palpitations that are only felt occasionally at rest.  ECG today shows normal sinus rhythm with unchanged bifascicular block. Concern for A-fib is low.  Will place 7-day heart monitor to assess for arrhythmias including atrial fibrillation as well as to establish PVC burden. With bifascicular block, further patient not be on 2 AV nodal agents.  Given this along with chronic orthostatic symptoms, we will plan to stop diltiazem .  For now I would like patient to continue with propranolol  10 mg taken once daily in the morning.  He will need to monitor blood pressure. Check complete metabolic panel, magnesium, TSH today Follow-up in 2 months to reassess symptoms.  Orthostatic hypotension Continues with chronic orthostatic symptoms.  As above, will plan to stop diltiazem .  Encouraged patient to continue using compression stockings as tolerated.  Hyperlipidemia Patient's statin previously managed by Dr. Fernande.  Will check a lipid panel today.  Going forward we will defer management to patient's PCP Dr. Kip.  Parkinson's Patient now following with Dr. Rosalia.     Follow up with EP APP 2 months.   Signed, Artist Pouch, PA-C  "

## 2024-12-30 NOTE — Patient Instructions (Addendum)
 Medication Instructions:     STOP TAKING : DILTIAZEM     *If you need a refill on your cardiac medications before your next appointment, please call your pharmacy*   Lab Work:  PLEASE GO DOWN STAIRS  [facility]  FIRST FLOOR   ( GET OFF ELEVATORS WALK TOWARDS WAITING AREA LAB LOCATED BY PHARMACY):  CMET  MAG  LIPID AND TSH TODAY      If you have labs (blood work) drawn today and your tests are completely normal, you will receive your results only by: MyChart Message (if you have MyChart) OR A paper copy in the mail If you have any lab test that is abnormal or we need to change your treatment, we will call you to review the results.  Testing/Procedures: Your physician has recommended that you wear an event monitor. Event monitors are medical devices that record the hearts electrical activity. Doctors most often us  these monitors to diagnose arrhythmias. Arrhythmias are problems with the speed or rhythm of the heartbeat. The monitor is a small, portable device. You can wear one while you do your normal daily activities. This is usually used to diagnose what is causing palpitations/syncope (passing out).    Follow-Up: At [facility], you and your health needs are our priority.  As part of our continuing mission to provide you with exceptional heart care, our providers are all part of one team.  This team includes your primary Cardiologist (physician) and Advanced Practice Providers or APPs (Physician Assistants and Nurse Practitioners) who all work together to provide you with the care you need, when you need it.  Your next appointment:    2 month(s)   Provider:   Artist Pouch, PA-C ( CONTACT  CASSIE HALL/ ANGELINE HAMMER FOR EP SCHEDULING ISSUES )    We recommend signing up for the patient portal called MyChart.  Sign up information is provided on this After Visit Summary.  MyChart is used to connect with patients for Virtual Visits (Telemedicine).  Patients are able to view  lab/test results, encounter notes, upcoming appointments, etc.  Non-urgent messages can be sent to your provider as well.   To learn more about what you can do with MyChart, go to [email or web address].   Other Instructions  ZIO XT- Long Term Monitor Instructions  Your physician has requested you wear a ZIO patch monitor for 7  days.    This is a single patch monitor. [employer] supplies one patch monitor per enrollment. Additional stickers are not available. Please do not apply patch if you will be having a Nuclear Stress Test,  Echocardiogram, Cardiac CT, MRI, or Chest Xray during the period you would be wearing the  monitor. The patch cannot be worn during these tests. You cannot remove and re-apply the  ZIO XT patch monitor.  Your ZIO patch monitor will be mailed 3 day USPS to your address on file. It may take 3-5 days  to receive your monitor after you have been enrolled.  Once you have received your monitor, please review the enclosed instructions. Your monitor  has already been registered assigning a specific monitor serial # to you.  Billing and Patient Assistance Program Information  We have supplied [employer] with any of your insurance information on file for billing purposes. [employer] offers a sliding scale Patient Assistance Program for patients that do not have  insurance, or whose insurance does not completely cover the cost of the ZIO monitor.  You must apply for the Patient Assistance Program to  qualify for this discounted rate.  To apply, please call [employer] at [phone], select option 4, select option 2, ask to apply for  Patient Assistance Program. Meredeth will ask your household income, and how many people  are in your household. They will quote your out-of-pocket cost based on that information.  [employer] will also be able to set up a 67-month, interest-free payment plan if needed.  Applying the monitor   Shave hair from upper left chest.  Hold abrader disc by  orange tab. Rub abrader in 40 strokes over the upper left chest as  indicated in your monitor instructions.  Clean area with 4 enclosed alcohol pads. Let dry.  Apply patch as indicated in monitor instructions. Patch will be placed under collarbone on left  side of chest with arrow pointing upward.  Rub patch adhesive wings for 2 minutes. Remove white label marked 1. Remove the white  label marked 2. Rub patch adhesive wings for 2 additional minutes.  While looking in a mirror, press and release button in center of patch. A small green light will  flash 3-4 times. This will be your only indicator that the monitor has been turned on.  Do not shower for the first 24 hours. You may shower after the first 24 hours.  Press the button if you feel a symptom. You will hear a small click. Record Date, Time and  Symptom in the Patient Logbook.  When you are ready to remove the patch, follow instructions on the last 2 pages of Patient  Logbook. Stick patch monitor onto the last page of Patient Logbook.  Place Patient Logbook in the blue and white box. Use locking tab on box and tape box closed  securely. The blue and white box has prepaid postage on it. Please place it in the mailbox as  soon as possible. Your physician should have your test results approximately 7 days after the  monitor has been mailed back to [facility].  Call [facility] Customer Care at [phone] if you have questions regarding  your ZIO XT patch monitor. Call them immediately if you see an orange light blinking on your  monitor.  If your monitor falls off in less than 4 days, contact our Monitor department at [phone].  If your monitor becomes loose or falls off after 4 days call [employer] at [phone] for  suggestions on securing your monitor

## 2024-12-30 NOTE — Progress Notes (Unsigned)
 Enrolled patient for a 7 day Zio XT monitor to be mailed to patients home  [place] to read

## 2024-12-31 ENCOUNTER — Ambulatory Visit: Payer: Self-pay | Admitting: Cardiology

## 2025-03-17 ENCOUNTER — Ambulatory Visit: Admitting: Cardiology

## 2025-03-18 ENCOUNTER — Ambulatory Visit: Admitting: Student
